# Patient Record
Sex: Male | Born: 1960 | Race: White | Hispanic: No | Marital: Single | Smoking: Former smoker
Health system: Southern US, Community
[De-identification: ages and names within clinical notes are randomized; demographics above are authoritative.]

## PROBLEM LIST (undated history)

## (undated) DIAGNOSIS — N186 End stage renal disease: Secondary | ICD-10-CM

## (undated) DIAGNOSIS — Q613 Polycystic kidney, unspecified: Secondary | ICD-10-CM

## (undated) DIAGNOSIS — R0602 Shortness of breath: Secondary | ICD-10-CM

## (undated) DIAGNOSIS — I82409 Acute embolism and thrombosis of unspecified deep veins of unspecified lower extremity: Secondary | ICD-10-CM

## (undated) DIAGNOSIS — D72829 Elevated white blood cell count, unspecified: Secondary | ICD-10-CM

## (undated) DIAGNOSIS — N189 Chronic kidney disease, unspecified: Secondary | ICD-10-CM

## (undated) DIAGNOSIS — I10 Essential (primary) hypertension: Secondary | ICD-10-CM

## (undated) DIAGNOSIS — F419 Anxiety disorder, unspecified: Secondary | ICD-10-CM

## (undated) DIAGNOSIS — D649 Anemia, unspecified: Secondary | ICD-10-CM

## (undated) DIAGNOSIS — K219 Gastro-esophageal reflux disease without esophagitis: Secondary | ICD-10-CM

## (undated) HISTORY — PX: COLONOSCOPY: SHX174

## (undated) HISTORY — DX: Acute embolism and thrombosis of unspecified deep veins of unspecified lower extremity: I82.409

## (undated) HISTORY — PX: NOSE SURGERY: SHX723

## (undated) HISTORY — DX: Chronic kidney disease, unspecified: N18.9

---

## 2014-04-02 ENCOUNTER — Emergency Department (HOSPITAL_COMMUNITY): Payer: Self-pay

## 2014-04-02 ENCOUNTER — Ambulatory Visit (INDEPENDENT_AMBULATORY_CARE_PROVIDER_SITE_OTHER): Payer: Self-pay | Admitting: Internal Medicine

## 2014-04-02 ENCOUNTER — Inpatient Hospital Stay (HOSPITAL_COMMUNITY): Payer: Self-pay

## 2014-04-02 ENCOUNTER — Encounter (HOSPITAL_COMMUNITY): Payer: Self-pay | Admitting: Emergency Medicine

## 2014-04-02 ENCOUNTER — Inpatient Hospital Stay (HOSPITAL_COMMUNITY)
Admission: EM | Admit: 2014-04-02 | Discharge: 2014-04-10 | DRG: 673 | Disposition: A | Discharge status: Home / Self Care | Payer: Self-pay | Attending: Nephrology | Admitting: Nephrology

## 2014-04-02 ENCOUNTER — Inpatient Hospital Stay (HOSPITAL_COMMUNITY): Payer: MEDICAID

## 2014-04-02 VITALS — BP 152/104 | HR 100 | Temp 97.7°F | Resp 22 | Ht 68.0 in | Wt 168.0 lb

## 2014-04-02 DIAGNOSIS — R7309 Other abnormal glucose: Secondary | ICD-10-CM

## 2014-04-02 DIAGNOSIS — I10 Essential (primary) hypertension: Secondary | ICD-10-CM

## 2014-04-02 DIAGNOSIS — D696 Thrombocytopenia, unspecified: Secondary | ICD-10-CM | POA: Diagnosis present

## 2014-04-02 DIAGNOSIS — Q613 Polycystic kidney, unspecified: Secondary | ICD-10-CM | POA: Insufficient documentation

## 2014-04-02 DIAGNOSIS — E876 Hypokalemia: Secondary | ICD-10-CM | POA: Diagnosis present

## 2014-04-02 DIAGNOSIS — I120 Hypertensive chronic kidney disease with stage 5 chronic kidney disease or end stage renal disease: Secondary | ICD-10-CM | POA: Diagnosis present

## 2014-04-02 DIAGNOSIS — Z8271 Family history of polycystic kidney: Secondary | ICD-10-CM

## 2014-04-02 DIAGNOSIS — R791 Abnormal coagulation profile: Secondary | ICD-10-CM | POA: Diagnosis present

## 2014-04-02 DIAGNOSIS — R739 Hyperglycemia, unspecified: Secondary | ICD-10-CM

## 2014-04-02 DIAGNOSIS — N19 Unspecified kidney failure: Secondary | ICD-10-CM

## 2014-04-02 DIAGNOSIS — J9600 Acute respiratory failure, unspecified whether with hypoxia or hypercapnia: Secondary | ICD-10-CM | POA: Diagnosis present

## 2014-04-02 DIAGNOSIS — E872 Acidosis, unspecified: Secondary | ICD-10-CM

## 2014-04-02 DIAGNOSIS — R42 Dizziness and giddiness: Secondary | ICD-10-CM

## 2014-04-02 DIAGNOSIS — D72829 Elevated white blood cell count, unspecified: Secondary | ICD-10-CM

## 2014-04-02 DIAGNOSIS — E860 Dehydration: Secondary | ICD-10-CM

## 2014-04-02 DIAGNOSIS — IMO0002 Reserved for concepts with insufficient information to code with codable children: Secondary | ICD-10-CM

## 2014-04-02 DIAGNOSIS — G9341 Metabolic encephalopathy: Secondary | ICD-10-CM | POA: Diagnosis present

## 2014-04-02 DIAGNOSIS — R630 Anorexia: Secondary | ICD-10-CM | POA: Diagnosis present

## 2014-04-02 DIAGNOSIS — G9340 Encephalopathy, unspecified: Secondary | ICD-10-CM

## 2014-04-02 DIAGNOSIS — N179 Acute kidney failure, unspecified: Principal | ICD-10-CM

## 2014-04-02 DIAGNOSIS — N2581 Secondary hyperparathyroidism of renal origin: Secondary | ICD-10-CM | POA: Diagnosis present

## 2014-04-02 DIAGNOSIS — Z87891 Personal history of nicotine dependence: Secondary | ICD-10-CM

## 2014-04-02 DIAGNOSIS — Q612 Polycystic kidney, adult type: Secondary | ICD-10-CM

## 2014-04-02 DIAGNOSIS — N186 End stage renal disease: Secondary | ICD-10-CM | POA: Diagnosis present

## 2014-04-02 DIAGNOSIS — D631 Anemia in chronic kidney disease: Secondary | ICD-10-CM | POA: Diagnosis present

## 2014-04-02 DIAGNOSIS — A419 Sepsis, unspecified organism: Secondary | ICD-10-CM

## 2014-04-02 DIAGNOSIS — N039 Chronic nephritic syndrome with unspecified morphologic changes: Secondary | ICD-10-CM

## 2014-04-02 DIAGNOSIS — R0602 Shortness of breath: Secondary | ICD-10-CM

## 2014-04-02 DIAGNOSIS — R634 Abnormal weight loss: Secondary | ICD-10-CM | POA: Diagnosis present

## 2014-04-02 DIAGNOSIS — R000 Tachycardia, unspecified: Secondary | ICD-10-CM | POA: Diagnosis present

## 2014-04-02 HISTORY — DX: Essential (primary) hypertension: I10

## 2014-04-02 HISTORY — DX: Shortness of breath: R06.02

## 2014-04-02 HISTORY — DX: Polycystic kidney, unspecified: Q61.3

## 2014-04-02 HISTORY — PX: CENTRAL VENOUS CATHETER INSERTION: SHX401

## 2014-04-02 HISTORY — DX: Elevated white blood cell count, unspecified: D72.829

## 2014-04-02 HISTORY — DX: Anemia, unspecified: D64.9

## 2014-04-02 LAB — POCT CBC
Granulocyte percent: 85.2 %G — AB (ref 37–80)
HCT, POC: 29.4 % — AB (ref 43.5–53.7)
HEMOGLOBIN: 9.8 g/dL — AB (ref 14.1–18.1)
LYMPH, POC: 1.7 (ref 0.6–3.4)
MCH: 29.2 pg (ref 27–31.2)
MCHC: 33.3 g/dL (ref 31.8–35.4)
MCV: 87.5 fL (ref 80–97)
MID (CBC): 0.8 (ref 0–0.9)
MPV: 9 fL (ref 0–99.8)
POC Granulocyte: 14.5 — AB (ref 2–6.9)
POC LYMPH PERCENT: 9.8 %L — AB (ref 10–50)
POC MID %: 5 %M (ref 0–12)
Platelet Count, POC: 349 10*3/uL (ref 142–424)
RBC: 3.36 M/uL — AB (ref 4.69–6.13)
RDW, POC: 13.8 %
WBC: 17 10*3/uL — AB (ref 4.6–10.2)

## 2014-04-02 LAB — CBC WITH DIFFERENTIAL/PLATELET
BASOS ABS: 0 10*3/uL (ref 0.0–0.1)
Basophils Relative: 0 % (ref 0–1)
EOS ABS: 0 10*3/uL (ref 0.0–0.7)
Eosinophils Relative: 0 % (ref 0–5)
HEMATOCRIT: 27.6 % — AB (ref 39.0–52.0)
Hemoglobin: 9.7 g/dL — ABNORMAL LOW (ref 13.0–17.0)
LYMPHS ABS: 0.5 10*3/uL — AB (ref 0.7–4.0)
Lymphocytes Relative: 3 % — ABNORMAL LOW (ref 12–46)
MCH: 29.5 pg (ref 26.0–34.0)
MCHC: 35.1 g/dL (ref 30.0–36.0)
MCV: 83.9 fL (ref 78.0–100.0)
MONO ABS: 0.6 10*3/uL (ref 0.1–1.0)
Monocytes Relative: 4 % (ref 3–12)
NEUTROS ABS: 14.9 10*3/uL — AB (ref 1.7–7.7)
Neutrophils Relative %: 93 % — ABNORMAL HIGH (ref 43–77)
PLATELETS: 341 10*3/uL (ref 150–400)
RBC: 3.29 MIL/uL — ABNORMAL LOW (ref 4.22–5.81)
RDW: 14.1 % (ref 11.5–15.5)
WBC: 16 10*3/uL — ABNORMAL HIGH (ref 4.0–10.5)

## 2014-04-02 LAB — I-STAT VENOUS BLOOD GAS, ED
ACID-BASE DEFICIT: 22 mmol/L — AB (ref 0.0–2.0)
Bicarbonate: 6.2 mEq/L — ABNORMAL LOW (ref 20.0–24.0)
O2 SAT: 33 %
PCO2 VEN: 21.7 mmHg — AB (ref 45.0–50.0)
PO2 VEN: 28 mmHg — AB (ref 30.0–45.0)
TCO2: 7 mmol/L (ref 0–100)
pH, Ven: 7.061 — CL (ref 7.250–7.300)

## 2014-04-02 LAB — BASIC METABOLIC PANEL
BUN: 202 mg/dL — AB (ref 6–23)
BUN: 197 mg/dL — ABNORMAL HIGH (ref 6–23)
BUN: 179 mg/dL — ABNORMAL HIGH (ref 6–23)
CHLORIDE: 90 meq/L — AB (ref 96–112)
CHLORIDE: 94 meq/L — AB (ref 96–112)
CHLORIDE: 95 meq/L — AB (ref 96–112)
CO2: 9 meq/L — AB (ref 19–32)
CREATININE: 25.35 mg/dL — AB (ref 0.50–1.35)
Calcium: 4.1 mg/dL — CL (ref 8.4–10.5)
Calcium: 4.3 mg/dL — CL (ref 8.4–10.5)
Creatinine, Ser: 22.56 mg/dL — ABNORMAL HIGH (ref 0.50–1.35)
Creatinine, Ser: 18.59 mg/dL — ABNORMAL HIGH (ref 0.50–1.35)
GFR calc Af Amer: 2 mL/min — ABNORMAL LOW (ref >90)
GFR calc Af Amer: 2 mL/min — ABNORMAL LOW (ref >90)
GFR calc Af Amer: 3 mL/min — ABNORMAL LOW (ref >90)
GFR calc non Af Amer: 2 mL/min — ABNORMAL LOW (ref >90)
GFR calc non Af Amer: 2 mL/min — ABNORMAL LOW (ref >90)
GFR calc non Af Amer: 2 mL/min — ABNORMAL LOW (ref >90)
GLUCOSE: 143 mg/dL — AB (ref 70–99)
Glucose, Bld: 184 mg/dL — ABNORMAL HIGH (ref 70–99)
Glucose, Bld: 152 mg/dL — ABNORMAL HIGH (ref 70–99)
POTASSIUM: 3.6 meq/L — AB (ref 3.7–5.3)
POTASSIUM: 2.9 meq/L — AB (ref 3.7–5.3)
Potassium: 4.1 mEq/L (ref 3.7–5.3)
SODIUM: 137 meq/L (ref 137–147)
SODIUM: 138 meq/L (ref 137–147)
Sodium: 135 mEq/L — ABNORMAL LOW (ref 137–147)

## 2014-04-02 LAB — PRO B NATRIURETIC PEPTIDE: Pro B Natriuretic peptide (BNP): >70000 pg/mL — ABNORMAL HIGH (ref 0.0–100.0)

## 2014-04-02 LAB — I-STAT ARTERIAL BLOOD GAS, ED
ACID-BASE DEFICIT: 21 mmol/L — AB (ref 0.0–2.0)
BICARBONATE: 5.1 meq/L — AB (ref 20.0–24.0)
O2 Saturation: 98 %
PCO2 ART: 13.6 mmHg — AB (ref 35.0–45.0)
PO2 ART: 117 mmHg — AB (ref 80.0–100.0)
TCO2: 5 mmol/L (ref 0–100)
pH, Arterial: 7.181 — CL (ref 7.350–7.450)

## 2014-04-02 LAB — POCT ACTIVATED CLOTTING TIME
Activated Clotting Time: 116 seconds
Activated Clotting Time: 143 seconds
Activated Clotting Time: 149 seconds
Activated Clotting Time: 171 seconds
Activated Clotting Time: 171 seconds

## 2014-04-02 LAB — URINE MICROSCOPIC-ADD ON

## 2014-04-02 LAB — RENAL FUNCTION PANEL
Albumin: 3.6 g/dL (ref 3.5–5.2)
BUN: 201 mg/dL — AB (ref 6–23)
CHLORIDE: 92 meq/L — AB (ref 96–112)
CO2: <7 mEq/L — CL (ref 19–32)
Calcium: 4 mg/dL — CL (ref 8.4–10.5)
Creatinine, Ser: 24.55 mg/dL — ABNORMAL HIGH (ref 0.50–1.35)
GFR calc Af Amer: 2 mL/min — ABNORMAL LOW (ref >90)
GFR, EST NON AFRICAN AMERICAN: 2 mL/min — AB (ref >90)
Glucose, Bld: 139 mg/dL — ABNORMAL HIGH (ref 70–99)
POTASSIUM: 3.9 meq/L (ref 3.7–5.3)
Phosphorus: 13.1 mg/dL — ABNORMAL HIGH (ref 2.3–4.6)
Sodium: 135 mEq/L — ABNORMAL LOW (ref 137–147)

## 2014-04-02 LAB — PROTIME-INR
INR: 1.51 — ABNORMAL HIGH (ref 0.00–1.49)
Prothrombin Time: 17.8 seconds — ABNORMAL HIGH (ref 11.6–15.2)

## 2014-04-02 LAB — I-STAT TROPONIN, ED: TROPONIN I, POC: 0.03 ng/mL (ref 0.00–0.08)

## 2014-04-02 LAB — GLUCOSE, POCT (MANUAL RESULT ENTRY): POC Glucose: 192 mg/dl — AB (ref 70–99)

## 2014-04-02 LAB — URINALYSIS, ROUTINE W REFLEX MICROSCOPIC
Bilirubin Urine: NEGATIVE
Glucose, UA: 100 mg/dL — AB
Ketones, ur: 15 mg/dL — AB
Nitrite: NEGATIVE
PH: 5.5 (ref 5.0–8.0)
PROTEIN: 100 mg/dL — AB
Specific Gravity, Urine: 1.015 (ref 1.005–1.030)
Urobilinogen, UA: 0.2 mg/dL (ref 0.0–1.0)

## 2014-04-02 LAB — HEPATITIS B SURFACE ANTIGEN: HEP B S AG: NEGATIVE

## 2014-04-02 LAB — APTT: APTT: 33 s (ref 24–37)

## 2014-04-02 LAB — I-STAT CG4 LACTIC ACID, ED: Lactic Acid, Venous: 1.1 mmol/L (ref 0.5–2.2)

## 2014-04-02 LAB — IRON AND TIBC: IRON: 178 ug/dL — AB (ref 42–135)

## 2014-04-02 LAB — MRSA PCR SCREENING: MRSA by PCR: NEGATIVE

## 2014-04-02 LAB — HIV ANTIBODY (ROUTINE TESTING W REFLEX): HIV 1&2 Ab, 4th Generation: NONREACTIVE

## 2014-04-02 LAB — GLUCOSE, CAPILLARY: Glucose-Capillary: 132 mg/dL — ABNORMAL HIGH (ref 70–99)

## 2014-04-02 LAB — PHOSPHORUS: PHOSPHORUS: 14 mg/dL — AB (ref 2.3–4.6)

## 2014-04-02 LAB — FERRITIN: Ferritin: 383 ng/mL — ABNORMAL HIGH (ref 22–322)

## 2014-04-02 MED ORDER — PRISMASOL BGK 4/2.5 32-4-2.5 MEQ/L IV SOLN
INTRAVENOUS | Status: DC
  Administered 2014-04-02: 17:00:00 via INTRAVENOUS_CENTRAL
  Filled 2014-04-02 (×5): qty 5000

## 2014-04-02 MED ORDER — ASPIRIN 300 MG RE SUPP: 300.0000 mg | RECTAL | Status: AC

## 2014-04-02 MED ORDER — HEPARIN SODIUM (PORCINE) 1000 UNIT/ML DIALYSIS
1000.0000 [IU] | INTRAMUSCULAR | Status: DC | PRN
  Filled 2014-04-02: qty 6

## 2014-04-02 MED ORDER — HEPARIN BOLUS VIA INFUSION (CRRT)
1000.0000 [IU] | INTRAVENOUS | Status: DC | PRN
Start: 2014-04-02 — End: 2014-04-03
  Administered 2014-04-02 (×2): 1000 [IU] via INTRAVENOUS_CENTRAL
  Filled 2014-04-02: qty 1000

## 2014-04-02 MED ORDER — FAMOTIDINE IN NACL 20-0.9 MG/50ML-% IV SOLN
20.0000 mg | Freq: Two times a day (BID) | INTRAVENOUS | Status: DC
  Filled 2014-04-02: qty 50

## 2014-04-02 MED ORDER — PNEUMOCOCCAL VAC POLYVALENT 25 MCG/0.5ML IJ INJ
0.5000 mL | INJECTION | INTRAMUSCULAR | Status: AC
  Administered 2014-04-03: 0.5 mL via INTRAMUSCULAR
  Filled 2014-04-02: qty 0.5

## 2014-04-02 MED ORDER — ASPIRIN 81 MG PO CHEW
324.0000 mg | CHEWABLE_TABLET | ORAL | Status: AC
  Administered 2014-04-02: 324 mg via ORAL
  Filled 2014-04-02: qty 4

## 2014-04-02 MED ORDER — SODIUM CHLORIDE 0.9 % IJ SOLN
250.0000 [IU]/h | INTRAMUSCULAR | Status: DC
  Administered 2014-04-02: 0.5 [IU]/h via INTRAVENOUS_CENTRAL
  Administered 2014-04-03: 1150 [IU]/h via INTRAVENOUS_CENTRAL
  Administered 2014-04-03: 1400 [IU]/h via INTRAVENOUS_CENTRAL
  Filled 2014-04-02 (×4): qty 2

## 2014-04-02 MED ORDER — FAMOTIDINE IN NACL 20-0.9 MG/50ML-% IV SOLN
20.0000 mg | INTRAVENOUS | Status: DC
  Administered 2014-04-02: 20 mg via INTRAVENOUS
  Filled 2014-04-02 (×2): qty 50

## 2014-04-02 MED ORDER — SODIUM BICARBONATE 8.4 % IV SOLN
INTRAVENOUS | Status: DC
  Administered 2014-04-02 – 2014-04-03 (×2): via INTRAVENOUS
  Filled 2014-04-02 (×4): qty 150

## 2014-04-02 MED ORDER — SODIUM CHLORIDE 0.9 % IV SOLN: 250.0000 mL | INTRAVENOUS | Status: DC | PRN

## 2014-04-02 MED ORDER — PRISMASOL BGK 4/2.5 32-4-2.5 MEQ/L IV SOLN
INTRAVENOUS | Status: DC
  Administered 2014-04-02 – 2014-04-03 (×4): via INTRAVENOUS_CENTRAL
  Filled 2014-04-02 (×10): qty 5000

## 2014-04-02 MED ORDER — POTASSIUM CHLORIDE 10 MEQ/50ML IV SOLN
INTRAVENOUS | Status: AC
  Filled 2014-04-02: qty 50

## 2014-04-02 MED ORDER — CALCIUM CARBONATE ANTACID 500 MG PO CHEW
800.0000 mg | CHEWABLE_TABLET | Freq: Four times a day (QID) | ORAL | Status: DC
  Administered 2014-04-02 (×2): 800 mg via ORAL
  Filled 2014-04-02 (×7): qty 4

## 2014-04-02 MED ORDER — HEPARIN SODIUM (PORCINE) 5000 UNIT/ML IJ SOLN
5000.0000 [IU] | Freq: Three times a day (TID) | INTRAMUSCULAR | Status: DC
  Administered 2014-04-02 – 2014-04-07 (×12): 5000 [IU] via SUBCUTANEOUS
  Filled 2014-04-02 (×18): qty 1

## 2014-04-02 MED ORDER — SODIUM CHLORIDE 0.9 % IV BOLUS (SEPSIS)
500.0000 mL | Freq: Once | INTRAVENOUS | Status: AC
  Administered 2014-04-02: 500 mL via INTRAVENOUS

## 2014-04-02 MED ORDER — BIOTENE DRY MOUTH MT LIQD
15.0000 mL | Freq: Two times a day (BID) | OROMUCOSAL | Status: DC
  Administered 2014-04-02 – 2014-04-03 (×2): 15 mL via OROMUCOSAL

## 2014-04-02 MED ORDER — POTASSIUM CHLORIDE 10 MEQ/50ML IV SOLN
10.0000 meq | INTRAVENOUS | Status: AC
  Administered 2014-04-02 – 2014-04-03 (×3): 10 meq via INTRAVENOUS
  Filled 2014-04-02 (×2): qty 50

## 2014-04-02 MED ORDER — ONDANSETRON HCL 4 MG/2ML IJ SOLN: 4.0000 mg | Freq: Four times a day (QID) | INTRAMUSCULAR | Status: DC | PRN

## 2014-04-02 MED ORDER — HEPARIN (PORCINE) 2000 UNITS/L FOR CRRT
INTRAVENOUS_CENTRAL | Status: DC | PRN
  Filled 2014-04-02: qty 1000

## 2014-04-02 MED ORDER — PANTOPRAZOLE SODIUM 40 MG IV SOLR: 40.0000 mg | INTRAVENOUS | Status: DC

## 2014-04-02 NOTE — H&P (Signed)
PULMONARY / CRITICAL CARE MEDICINE   Name: [name] [name] MRN: [id number] DOB: [birth year]    ADMISSION DATE:  04/02/2014  REFERRING MD :  Dr. Christy Gentles  PRIMARY SERVICE: PCCM  CHIEF COMPLAINT:  Metabolic Acidosis  BRIEF PATIENT DESCRIPTION: 53 y/o M with PMH of polycystic kidney disease (no follow up ) who presented to [facility] ER on 6/15 with a 2 wk hx of increasing shortness of breath.  Found to have profound metabolic acidosis and acute renal failure.    SIGNIFICANT EVENTS / STUDIES:  6/15 - Admit with a 2 wk hx of increasing shortness of breath.  Found to have profound metabolic acidosis and acute renal failure.    LINES / TUBES: R IJ HD Cath 6/15 >>  CULTURES: BCx2 6/15 >> UA 6/15 >> UC 6/15 >>   ANTIBIOTICS:   HISTORY OF PRESENT ILLNESS:  53 y/o M, never smoker,  with PMH of polycystic kidney disease who presented to [facility] ER on 6/15 with a 2 wk hx of increasing shortness of breath, nausea, vomiting and diarrhea (reports 1 BM per day that is loose).  Occasionally stool has had some bloody streaks.  He notes that he has lost approximately 30 lbs over the last several weeks. Patient reports he does not follow with anyone regarding his PCKD.  He sought help at the Urgent Care and was noted to be significantly short of breath and cyanotic.  Patient was transferred to [facility] ER for further evaluation.  Family reports that patients sister & father are deceased of kidney failure and his brother is on dialysis. He indicates he does not heal well and has multiple LE round lesions.    Initial ER evaluation notable for venous gas ph 7.06 / Bicarb 6.2,  Na 135, K 4.1, CO2 <7, BUN 202, Sr Cr 25.35, glucose 184, troponin 0.03, proBNP > 70,000, lactic acid 1.10, WBC 16, Hgb 9.7 & clear CXR.  PCCM called for ICU admission.    PAST MEDICAL HISTORY :  Past Medical History  Diagnosis Date  . Chronic kidney disease    History reviewed. No pertinent past surgical history. Prior to Admission medications    Medication Sig Start Date End Date Taking? Authorizing Provider  cetirizine (ZYRTEC) 10 MG tablet Take 10 mg by mouth daily as needed for allergies.   Yes Historical Provider, MD  ibuprofen (ADVIL,MOTRIN) 200 MG tablet Take 200 mg by mouth every 8 (eight) hours as needed for moderate pain.   Yes Historical Provider, MD   No Known Allergies  FAMILY HISTORY:  History reviewed. No pertinent family history.  SOCIAL HISTORY:  reports that he has never smoked. He does not have any smokeless tobacco history on file. He reports that he does not drink alcohol. His drug history is not on file.  REVIEW OF SYSTEMS:   Gen: Denies fever, chills, night sweats.  Reports weight change of ~ 30 lbs, fatigue HEENT: Denies blurred vision, double vision, hearing loss, tinnitus, sinus congestion, rhinorrhea, sore throat, neck stiffness, dysphagia PULM: Denies cough, sputum production, hemoptysis, wheezing.  Reports increasing shortness of breath CV: Denies chest pain, edema, orthopnea, paroxysmal nocturnal dyspnea, palpitations GI: Denies abdominal pain, melena, constipation, change in bowel habits.  Reports nausea, vomiting, diarrhea, occasional bright bloody streaks GU: Denies dysuria, hematuria, polyuria, oliguria, urethral discharge Endocrine: Denies hot or cold intolerance, polyuria, polyphagia or appetite change Derm: Denies Pangilinan, dry skin, scaling or peeling skin change.  Reports poor wound healing.  Heme: Denies easy bruising, bleeding, bleeding gums Neuro:  Denies headache, numbness, weakness, slurred speech, loss of memory or consciousness   SUBJECTIVE:   VITAL SIGNS: Temp:  [97.5 F (36.4 C)-97.7 F (36.5 C)] 97.5 F (36.4 C) (06/15 1246) Pulse Rate:  [94-100] 96 (06/15 1430) Resp:  [17-22] 17 (06/15 1430) BP: (130-153)/(76-108) 153/99 mmHg (06/15 1430) SpO2:  [100 %] 100 % (06/15 1430) Weight:  [168 lb (76.204 kg)] 168 lb (76.204 kg) (06/15 1000)  HEMODYNAMICS:    VENTILATOR  SETTINGS:    INTAKE / OUTPUT: Intake/Output   None     PHYSICAL EXAMINATION: General:  Thin adult male, appears uncomfortable Neuro:  Mild lethargy, able to answer questions appropriately HEENT:  Mm pink/dry, no jvd Cardiovascular:  s1s2 rrr, no m/r/g Lungs:  resp's even/non-labored, lungs bilaterally clear Abdomen:  Round/soft, mild distention (pt reports is his 'norm'), BSx4 active  Musculoskeletal:  No acute deformities  Skin:  Cool/dry, scattered small round lesions on LE's of varying stages (appearance of old bug bites)  LABS:  CBC  Recent Labs Lab 04/02/14 1141 04/02/14 1228  WBC 17.0* 16.0*  HGB 9.8* 9.7*  HCT 29.4* 27.6*  PLT  --  341   Coag's  Recent Labs Lab 04/02/14 1237  APTT 33  INR 1.51*   BMET  Recent Labs Lab 04/02/14 1228  NA 135*  K 4.1  CL 90*  CO2 <7*  BUN 202*  CREATININE 25.35*  GLUCOSE 184*   Electrolytes  Recent Labs Lab 04/02/14 1228  CALCIUM 4.1*   Sepsis Markers  Recent Labs Lab 04/02/14 1304  LATICACIDVEN 1.10   ABG No results found for this basename: PHART, PCO2ART, PO2ART,  in the last 168 hours Liver Enzymes No results found for this basename: AST, ALT, ALKPHOS, BILITOT, ALBUMIN,  in the last 168 hours Cardiac Enzymes  Recent Labs Lab 04/02/14 1228  PROBNP >70000.0*   Glucose No results found for this basename: GLUCAP,  in the last 168 hours  Imaging Dg Chest Port 1 View  04/02/2014   CLINICAL DATA:  Chronic renal insufficiency and polycystic kidney disease  EXAM: PORTABLE CHEST - 1 VIEW  COMPARISON:  None.  FINDINGS: The lungs are mildly hypoinflated which accentuates the perihilar interstitial markings.The cardiac silhouette is top-normal in size. The pulmonary vascularity is not clearly engorged. The observed bony structures are unremarkable.  IMPRESSION: Mildly increased perihilar lung markings may be due to vascular crowding or could reflect subsegmental atelectasis. Otherwise there is no acute  cardiopulmonary abnormality.   Electronically Signed   By: David  Martinique   On: 04/02/2014 13:07     ASSESSMENT / PLAN:  PULMONARY A: Respiratory Distress / Tachypnea - in setting of profound metabolic acidosis related to AKI Uncompensated met acidosis P:   -O2 to support sats > 92% -pulmonary hygiene -PRN CXR -f/u ABG in 2 hours -monitor respiratory status closely, high risk intubation -urgent HD -Bicarb drip  CARDIOVASCULAR A:  Tachycardia  Hypertension  P:  -ICU admit, monitor hemodynamics -hold adjustment of HTN until HD initiated, if remains hypertensive overnight, consider rx in am 6/16 with BB -f/u EKG in am   RENAL A:   Acute Renal Failure Polycystic Kidney Disease hypocalcemia P:   -Now placement of HD cath -Emergent CVVHD per Nephrology -Q4 BMP -now bicarb gtt -STAT phos, may need ca supp, per renal -place foley STAT -renal imaging  GASTROINTESTINAL A:   Nausea / Vomiting - likely related to uremia / AKI Diarrhea  P:   -BID pepcid -PRN zofran  -NPO -LFT  HEMATOLOGIC  A:   Anemia - likely of chronic disease.  Reported blood streaked stool pta.  P:  -follow H/H -assess FOB to r/o GI source bleeding -sub q heparin -assess coags  INFECTIOUS A:   R/O Infectious Process as Precipitating Factor for AKI P:   -Assess BC, UA, UC -Hold ABX for now  -Assess HIV with weight loss. Lower ext lesions  ENDOCRINE A:   Hyperglycemia  P:   -Monitor CBG trend on BMP -Hold SSI unless glucose consistently > 200  NEUROLOGIC A:   Acute Metabolic Encephalopathy - in setting of uremia  P:   RASS goal: N/A -Monitor / Poth, NP-C [facility] Pgr: [phone] or [phone]   I updated Mom in full.   I have personally obtained a history, examined the patient, evaluated laboratory and imaging results, formulated the assessment and plan and placed orders.  CRITICAL CARE: The patient is critically ill with  multiple organ systems failure and requires high complexity decision making for assessment and support, frequent evaluation and titration of therapies, application of advanced monitoring technologies and extensive interpretation of multiple databases. Critical Care Time devoted to patient care services described in this note is 50 minutes.    04/02/2014, 3:03 PM  Lavon Paganini. Titus Mould, MD, [place] Pgr: [facility]

## 2014-04-02 NOTE — Procedures (Signed)
Central Venous Catheter Insertion Procedure Note [name] [name] [id number] May 17, 1961  Procedure: Insertion of Central Venous Catheter Indications: HD, emregent  Procedure Details Consent: Risks of procedure as well as the alternatives and risks of each were explained to the (patient/caregiver).  Consent for procedure obtained. Time Out: Verified patient identification, verified procedure, site/side was marked, verified correct patient position, special equipment/implants available, medications/allergies/relevent history reviewed, required imaging and test results available.  Performed  Maximum sterile technique was used including antiseptics, cap, gloves, gown, hand hygiene, mask and sheet. Skin prep: Chlorhexidine; local anesthetic administered A antimicrobial bonded/coated triple lumen catheter was placed in the right internal jugular vein using the Seldinger technique.  Evaluation Blood flow good Complications: No apparent complications Patient did tolerate procedure well. Chest X-ray ordered to verify placement.  CXR: wnl  [name] [name]. 04/02/2014, 3:49 PM  [place] Tolerated well [name] [name]. Titus Mould, MD, [place] Pgr: [facility]

## 2014-04-02 NOTE — ED Notes (Signed)
[facility] spoke with me to emphasize importance of getting the bicarbonate hung as quickly as possible.  Called pharmacy to expedite sodium bicarbonate, ETA 5-10 minutes and to arrive in pod B tube station

## 2014-04-02 NOTE — ED Notes (Signed)
Pt.s abd is distended but states that is his norm

## 2014-04-02 NOTE — Progress Notes (Signed)
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 Because of the severity of the acidosis (and the limitation in duration of initial HD treatments in dialysis naive patients because of the risk of disequilibrium syndrome) feel CRRT for first 24 hours would be best in terms of providing him with some initial dialysis but allowing for faster correction of his acidosis given that a slow short HD will have little impact on his acid base status.  Orders entered, full consult note to follow. Management of calcium dep on phosphorus level - pending

## 2014-04-02 NOTE — Progress Notes (Signed)
   Subjective:    Patient ID: [name] [name], male    DOB: [birth year], 53 y.o.   MRN: [id number]  HPI 52 year old male pt presents with diarrhea, nausea, and vomiting. He has been throwing up once or twice a day. On occasion he has seen some blood in his stool. No mucous or pus. Some days he has diarrhea several times a day. He takes imodium to help with the diarrhea, but the imodium will constipate him. No abdominal pain. No chest pain. Some SOB. Fatigue. Pt feels dehydrated. Has been trying to drink a lot of fluids, but still feels dehydrated. Pt has polycystic kidney disease.   Sister and father died of kidney failure, his brother is on dialysis for kidney failure and recently hospitalized for viral myocarditis. He is acute on chronic ill, tachypneic, cyanotic, cold and dry, stumbling when walks, mouth very dry. Has lost 30-40lbs, is dizzy.  Stat oxygen started, stat glucose 102, wbc 18000 shift left.   Review of Systems     Objective:   Physical Exam  Constitutional: He appears lethargic. He appears cachectic. He is cooperative. He appears toxic. He appears distressed. Nasal cannula in place.    cyanosis  HENT:  Mouth/Throat: Mucous membranes are cyanotic.  Mouth very dry mms  Eyes: EOM are normal. Pupils are equal, round, and reactive to light. No scleral icterus.  Neck: Normal range of motion. Neck supple. No tracheal deviation present.  Cardiovascular: Regular rhythm.  Tachycardia present.  Exam reveals gallop. Exam reveals no friction rub.   No murmur heard. Pulmonary/Chest: Accessory muscle usage present. No stridor. Tachypnea noted. He is in respiratory distress. He has decreased breath sounds. He has no wheezes. He has rhonchi. He has no rales.  Abdominal: He exhibits distension and mass. There is no tenderness.  Neurological: He appears lethargic. No cranial nerve deficit. He exhibits normal muscle tone. Coordination and gait abnormal.  Psychiatric: He has a normal mood and  affect. Thought content normal. His speech is delayed and slurred. He is slowed. Cognition and memory are impaired. He expresses inappropriate judgment.   Stat EKG tachycardia, long q-t, no acute changes.  Results for orders placed in visit on 04/02/14  GLUCOSE, POCT (MANUAL RESULT ENTRY)      Result Value Ref Range   POC Glucose 192 (*) 70 - 99 mg/dl  POCT CBC      Result Value Ref Range   WBC 17.0 (*) 4.6 - 10.2 K/uL   Lymph, poc 1.7  0.6 - 3.4   POC LYMPH PERCENT 9.8 (*) 10 - 50 %L   MID (cbc) 0.8  0 - 0.9   POC MID % 5.0  0 - 12 %M   POC Granulocyte 14.5 (*) 2 - 6.9   Granulocyte percent 85.2 (*) 37 - 80 %G   RBC 3.36 (*) 4.69 - 6.13 M/uL   Hemoglobin 9.8 (*) 14.1 - 18.1 g/dL   HCT, POC 29.4 (*) 43.5 - 53.7 %   MCV 87.5  80 - 97 fL   MCH, POC 29.2  27 - 31.2 pg   MCHC 33.3  31.8 - 35.4 g/dL   RDW, POC 13.8     Platelet Count, POC 349  142 - 424 K/uL   MPV 9.0  0 - 99.8 fL        Assessment & Plan:  SOB/Tachypnea/Cyanosis/ Stumbling gait Poly cystic kidney disease/Possible sepsis Anemia/Possible colon cancer and or renal failure Hypoxia

## 2014-04-02 NOTE — ED Notes (Signed)
Rt at the bedside attempting for ABG

## 2014-04-02 NOTE — ED Notes (Signed)
Pt here from urgent care , pt walked in urgent sob and cyanotic [facility] called out , pt has history of renal problems wbc at urgent care 17.0

## 2014-04-02 NOTE — ED Notes (Signed)
MD at bedside for central line placement. Pt awake and talkative

## 2014-04-02 NOTE — ED Provider Notes (Signed)
Pt found to have significant renal failure and metabolic acidosis and will require dialysis Nephrology has been consulted to arrange dialysis Critical care has also been consulted for admission   Sharyon Cable, MD 04/02/14 1351

## 2014-04-02 NOTE — Progress Notes (Signed)
CRITICAL VALUE ALERT  Critical value received:  Calcium 4.0,Co2 <7  Date of notification:  04/02/2014  Time of notification:  [id number]  Critical value read back:yes  Nurse who received alert:  Marisue Brooklyn  MD notified (1st page):  Dr Lyndal Pulley  Time of first page:  15  MD notified (2nd page):  Time of second page:  Responding MD:  MD at bedside. Both PCCM and Renal  Time MD responded:  1550

## 2014-04-02 NOTE — ED Provider Notes (Signed)
CSN: [id number]     Arrival date & time 04/02/14  1211 History   First MD Initiated Contact with Patient 04/02/14 1218     Chief Complaint  Patient presents with  . Shortness of Breath     (Consider location/radiation/quality/duration/timing/severity/associated sxs/prior Treatment) Patient is a 53 y.o. male presenting with shortness of breath. The history is provided by the patient.  Shortness of Breath Severity:  Moderate Onset quality:  Gradual Timing:  Constant Progression:  Worsening Chronicity:  New Relieved by:  Rest Worsened by:  Exertion Associated symptoms: no abdominal pain, no chest pain, no cough, no fever, no headaches and no vomiting     53 yo male pw sob. Gradual onset over past few days. Worsening. No cough or fever. Not had this previously. Has had diarrhea for past 3 weeks. Started taking immodium with some improvement. Has about 1 BM daily that is loose. Occasionally some bright red blood streaks. No other blood or dark stools.  H/o PCKD. Denies any other pmh. Not on any medications.  No cp. No pain anywhere per patient.  Denies neck swelling. No stridor.  No lower extremity edema or pain. No recent immobilization. No prior dvt/pe.  No cardiac history.   Per nursing report, patient was at urgent care and appeared cyanotic so placed on oxygen. No O2 sat taken prior to oxygen.     Past Medical History  Diagnosis Date  . Chronic kidney disease    History reviewed. No pertinent past surgical history. History reviewed. No pertinent family history. History  Substance Use Topics  . Smoking status: Never Smoker   . Smokeless tobacco: Not on file  . Alcohol Use: No    Review of Systems  Constitutional: Negative for fever and chills.  HENT: Negative for congestion and rhinorrhea.   Respiratory: Positive for shortness of breath. Negative for cough.   Cardiovascular: Negative for chest pain and leg swelling.  Gastrointestinal: Positive for diarrhea. Negative  for nausea, vomiting and abdominal pain.  Genitourinary: Negative for flank pain and difficulty urinating.  Musculoskeletal: Negative for back pain.  Neurological: Negative for dizziness and headaches.  All other systems reviewed and are negative.     Allergies  Review of patient's allergies indicates no known allergies.  Home Medications   Prior to Admission medications   Medication Sig Start Date End Date Taking? Authorizing Provider  cetirizine (ZYRTEC) 10 MG tablet Take 10 mg by mouth daily as needed for allergies.   Yes Historical Provider, MD  ibuprofen (ADVIL,MOTRIN) 200 MG tablet Take 200 mg by mouth every 8 (eight) hours as needed for moderate pain.   Yes Historical Provider, MD   BP 153/99  Pulse 96  Temp(Src) 97.5 F (36.4 C) (Axillary)  Resp 17  SpO2 100% Physical Exam  Nursing note and vitals reviewed. Constitutional: He is oriented to person, place, and time. He appears well-developed and well-nourished. No distress.  HENT:  Head: Normocephalic and atraumatic.  Eyes: Conjunctivae are normal. Right eye exhibits no discharge. Left eye exhibits no discharge.  Neck: No tracheal deviation present.  Cardiovascular: Normal rate, regular rhythm, normal heart sounds and intact distal pulses.   Pulmonary/Chest: Breath sounds normal. No stridor. He has no wheezes. He has no rales.  Mild tachypnea low 20's. Lungs clear throughout.  Abdominal: Soft. He exhibits no distension. There is no tenderness. There is no guarding.  Musculoskeletal: He exhibits no edema and no tenderness.  Neurological: He is alert and oriented to person, place, and time.  Skin: Skin is warm and dry.  Psychiatric: He has a normal mood and affect. His behavior is normal.    ED Course  Procedures (including critical care time) Labs Review Labs Reviewed  BASIC METABOLIC PANEL - Abnormal; Notable for the following:    Sodium 135 (*)    Chloride 90 (*)    CO2 <7 (*)    Glucose, Bld 184 (*)    BUN  202 (*)    Creatinine, Ser 25.35 (*)    Calcium 4.1 (*)    GFR calc non Af Amer 2 (*)    GFR calc Af Amer 2 (*)    All other components within normal limits  CBC WITH DIFFERENTIAL - Abnormal; Notable for the following:    WBC 16.0 (*)    RBC 3.29 (*)    Hemoglobin 9.7 (*)    HCT 27.6 (*)    Neutrophils Relative % 93 (*)    Lymphocytes Relative 3 (*)    Neutro Abs 14.9 (*)    Lymphs Abs 0.5 (*)    All other components within normal limits  PRO B NATRIURETIC PEPTIDE - Abnormal; Notable for the following:    Pro B Natriuretic peptide (BNP) >70000.0 (*)    All other components within normal limits  PROTIME-INR - Abnormal; Notable for the following:    Prothrombin Time 17.8 (*)    INR 1.51 (*)    All other components within normal limits  I-STAT VENOUS BLOOD GAS, ED - Abnormal; Notable for the following:    pH, Ven 7.061 (*)    pCO2, Ven 21.7 (*)    pO2, Ven 28.0 (*)    Bicarbonate 6.2 (*)    Acid-base deficit 22.0 (*)    All other components within normal limits  CULTURE, BLOOD (ROUTINE X 2)  CULTURE, BLOOD (ROUTINE X 2)  URINE CULTURE  APTT  BLOOD GAS, VENOUS  PHOSPHORUS  APTT  PROTIME-INR  RENAL FUNCTION PANEL  PARATHYROID HORMONE, INTACT (NO CA)  HEPATITIS B SURFACE ANTIGEN  IRON AND TIBC  FERRITIN  URINALYSIS, ROUTINE W REFLEX MICROSCOPIC  BLOOD GAS, ARTERIAL  BASIC METABOLIC PANEL  BASIC METABOLIC PANEL  BASIC METABOLIC PANEL  I-STAT TROPOININ, ED  I-STAT CG4 LACTIC ACID, ED    Imaging Review Dg Chest Port 1 View  04/02/2014   CLINICAL DATA:  Chronic renal insufficiency and polycystic kidney disease  EXAM: PORTABLE CHEST - 1 VIEW  COMPARISON:  None.  FINDINGS: The lungs are mildly hypoinflated which accentuates the perihilar interstitial markings.The cardiac silhouette is top-normal in size. The pulmonary vascularity is not clearly engorged. The observed bony structures are unremarkable.  IMPRESSION: Mildly increased perihilar lung markings may be due to  vascular crowding or could reflect subsegmental atelectasis. Otherwise there is no acute cardiopulmonary abnormality.   Electronically Signed   By: David  Martinique   On: 04/02/2014 13:07     EKG Interpretation   Date/Time:  Monday April 02 2014 12:15:11 EDT Ventricular Rate:  96 PR Interval:  156 QRS Duration: 117 QT Interval:  444 QTC Calculation: 561 R Axis:   46 Text Interpretation:  Sinus rhythm Nonspecific intraventricular conduction  delay Confirmed by Christy Gentles  MD, DONALD ([id number]) on 04/02/2014 12:19:29 PM      MDM   Final diagnoses:  Renal failure  Metabolic acidosis    53 yo male pw SOB. Not had previously.  O2 sats 100% on room air. Lungs clear throughout.  Patient with metabolic acidosis. Normal lactate. Without evidence of sepsis.  Hold on abx at this time.  BMP with creatinine ~25 and significantly elevated BUN.  Potassium wnl.  Nephrology consulted. Plan for dialysis.  Intensivist consulted for admission.   CXR without volume overload. No focal opacity to suggest pna. No ptx.  Bedside echo without pericardial effusion or gross RV strain.   Given presentation and above findings do not suspect ACS or PE at this time.  Symptoms likely 2/2 ARF.  Patient admitted.  Labs and imaging reviewed by myself and considered in medical decision making if ordered. Imaging interpreted by radiology.   Discussed case with Dr. Christy Gentles who is in agreement with assessment and plan.   Bonnita Hollow, MD 04/02/14 [phone]

## 2014-04-02 NOTE — Consult Note (Signed)
Physician:  Dr. Titus Mould Reason for Consult:  Renal failure, metabolic acidosis HPI: The patient is a 53 y.o. year-old WM with a known h/o ADPKD (and 2 family members who have had ESRD, 1 sister currently on dialysis in [place]).  He has deliberately not sought medical attention over the years but knew about his ADPKD. He presents with anorexia, fatigue, swelling and shortness of breath. Laboratory evaluation significant for profound metabolic acidosis, BUN > A999333, creatinine of 25, calcium of 4, phos of 13.1  Temp cath has been placed and because of the severity of his acidosis we are starting CRRT but will transition to HD and make plans for chronic HD once acidosis corrected.   Creatinine, Ser  Date/Time Value Ref Range Status  04/02/2014  3:57 PM 24.55* 0.50 - 1.35 mg/dL Final  04/02/2014 12:28 PM 25.35* 0.50 - 1.35 mg/dL Final    Past Me Past Medical History  Diagnosis Date  . Chronic kidney disease     Past Surgical History: History reviewed. No pertinent past surgical history.  Family History:  Family History  Problem Relation Age of Onset  . Polycystic kidney disease Father   . Polycystic kidney disease Sister    Social History:  reports that he has never smoked. He does not have any smokeless tobacco history on file. He reports that he does not drink alcohol. His drug history is not on file.  Allergies: No Known Allergies  Home medications: Prior to Admission medications   Medication Sig Start Date End Date Taking? Authorizing Provider  cetirizine (ZYRTEC) 10 MG tablet Take 10 mg by mouth daily as needed for allergies.   Yes Historical Provider, MD  ibuprofen (ADVIL,MOTRIN) 200 MG tablet Take 200 mg by mouth every 8 (eight) hours as needed for moderate pain.   Yes Historical Provider, MD    Inpatient medications: . aspirin  324 mg Oral NOW   Or  . aspirin  300 mg Rectal NOW  . famotidine (PEPCID) IV  20 mg Intravenous Q24H  . heparin  5,000 Units Subcutaneous 3 times  per day    Review of Systems Gen:  Denies headache, fever, chills, sweats.  + weight loss 20-30 lb. HEENT:  No visual change, sore throat, difficulty swallowing. Resp:  + DOE and at rest Cardiac:  No chest pain, orthopnea, PND.  + edema LE's GI:   Denies abdominal pain.   + nausea and vomiting as well as diarrhea GU:  Denies difficulty or change in voiding.  No change in urine color.     MS:  Denies joint pain or swelling.   Derm:  + pruritis and skin [place]  Neuro:   Denies focal weakness, memory problems, hx stroke or TIA.   Psych:  Denies symptoms of depression of anxiety.  No hallucination.     Physical Exam:  Blood pressure 160/102, pulse 96, temperature 97.8 F (36.6 C), temperature source Oral, resp. rate 18, height 5\' 9"  (1.753 m), weight 75 kg (165 lb 5.5 oz), SpO2 100.00%.  Gen: thin hyperpneic WM Appears dyspneic Temp cath in right IJ Skin with maculopapular lesions arms, legs, trunk Neck: no JVD, no bruits or LAN Chest: Clear Heart: regular, no rub or gallop  S1S2 No s3 Abdomen: soft, liver 2 cm below RCM Non tender Ext: Trace edema; some mottling around knees Neuro: alert, Ox3, no focal deficit; + asterixus; oriented but speaks deliberately Heme/Lymph: no bruising or LAN  Recent Labs Lab 04/02/14 1228 04/02/14 1557  NA 135* 135*  K 4.1 3.9  CL 90* 92*  CO2 <7* <7*  GLUCOSE 184* 139*  BUN 202* 201*  CREATININE 25.35* 24.55*  CALCIUM 4.1* 4.0*  PHOS 14.0* 13.1*    Recent Labs Lab 04/02/14 1557  ALBUMIN 3.6   No results found for this basename: LIPASE, AMYLASE,  in the last 168 hours No results found for this basename: AMMONIA,  in the last 168 hours CBC:  Recent Labs Lab 04/02/14 1141 04/02/14 1228  WBC 17.0* 16.0*  NEUTROABS  --  14.9*  HGB 9.8* 9.7*  HCT 29.4* 27.6*  MCV 87.5 83.9  PLT  --  341   Xrays/Other Studies: Dg Chest Portable 1 View  04/02/2014   CLINICAL DATA:  Central catheter placed  EXAM: PORTABLE CHEST - 1 VIEW  COMPARISON:   Study obtained earlier in the day  FINDINGS: Central catheter tip is in the superior vena cava. No pneumothorax. There is no edema or consolidation. Heart size and pulmonary vascularity are normal. No adenopathy. No bone lesions.  IMPRESSION: Central catheter tip in superior vena cava. No pneumothorax. Lungs clear.   Electronically Signed   By: Lowella Grip M.D.   On: 04/02/2014 15:58   Dg Chest Port 1 View  04/02/2014   CLINICAL DATA:  Chronic renal insufficiency and polycystic kidney disease  EXAM: PORTABLE CHEST - 1 VIEW  COMPARISON:  None.  FINDINGS: The lungs are mildly hypoinflated which accentuates the perihilar interstitial markings.The cardiac silhouette is top-normal in size. The pulmonary vascularity is not clearly engorged. The observed bony structures are unremarkable.  IMPRESSION: Mildly increased perihilar lung markings may be due to vascular crowding or could reflect subsegmental atelectasis. Otherwise there is no acute cardiopulmonary abnormality.   Electronically Signed   By: David  Martinique   On: 04/02/2014 13:07   Impression/Plan 1. 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 NO doubt ESRD. Multiple family members. Sister on HD.  Needs to start dialysis.  Due to severity of acidosis will use CRRT (since dialysis naive could only do a short slow HD which would not make a dent in the acidosis).  Would do full abdominal US to look for polycystic liver etc (sister has); save left arm; vein map; TDC + AVF later this week; CLIP 2. Metabolic acidosis - bicarb drip/CRRT 3. Hypocalcemia with hyperphosphatemia - start calcium based phos binders ASAP; check PTH 4. Anemia - check iron; start aranesp 5. Skin Reetz - unclear etiology; can't say this is all related to uremia 6. Hypertension - meds 7. leukocytosis - cultures to be done but no ATB's yet  Jamal Maes,  MD [facility] [phone] pager 04/02/2014, 5:40 PM

## 2014-04-02 NOTE — Consult Note (Signed)
Requesting Physician:  Dr. Titus Mould Reason for Consult:  Renal failure, metabolic acidosis HPI: The patient is a 53 y.o. year-old WM with a known h/o ADPKD (and 2 family members who have had ESRD, 1 sister currently on dialysis in [place]).  He has deliberately not sought medical attention over the years but knew about his ADPKD. He presents with anorexia, fatigue, swelling and shortness of breath. Laboratory evaluation significant for profound metabolic acidosis, BUN > A999333, creatinine of 25, calcium of 4, phos of 13.1.  Temp cath has been placed and because of the severity of his acidosis we are starting CRRT but will transition to HD and make plans for HD once acidosis corrected.   Creatinine, Ser  Date/Time Value Ref Range Status  04/02/2014  3:57 PM 24.55* 0.50 - 1.35 mg/dL Final  04/02/2014 12:28 PM 25.35* 0.50 - 1.35 mg/dL Final    Past Medical History:  Past Medical History  Diagnosis Date  . Chronic kidney disease     Past Surgical History: History reviewed. No pertinent past surgical history.  Family History:  Family History  Problem Relation Age of Onset  . Polycystic kidney disease Father   . Polycystic kidney disease Sister    Social History:  reports that he has never smoked. He does not have any smokeless tobacco history on file. He reports that he does not drink alcohol. His drug history is not on file.  Allergies: No Known Allergies  Home medications: Prior to Admission medications   Medication Sig Start Date End Date Taking? Authorizing Provider  cetirizine (ZYRTEC) 10 MG tablet Take 10 mg by mouth daily as needed for allergies.   Yes Historical Provider, MD  ibuprofen (ADVIL,MOTRIN) 200 MG tablet Take 200 mg by mouth every 8 (eight) hours as needed for moderate pain.   Yes Historical Provider, MD    Inpatient medications: . aspirin  324 mg Oral NOW   Or  . aspirin  300 mg Rectal NOW  . famotidine (PEPCID) IV  20 mg Intravenous Q24H  . heparin  5,000 Units  Subcutaneous 3 times per day    Review of Systems Gen:  Denies headache, fever, chills, sweats.  + weight loss 20-30 lb. HEENT:  No visual change, sore throat, difficulty swallowing. Resp:  + DOE and at rest Cardiac:  No chest pain, orthopnea, PND.  + edema LE's GI:   Denies abdominal pain.   + nausea and vomiting as well as diarrhea GU:  Denies difficulty or change in voiding.  No change in urine color.     MS:  Denies joint pain or swelling.   Derm:  + pruritis and skin Mirza  Neuro:   Denies focal weakness, memory problems, hx stroke or TIA.   Psych:  Denies symptoms of depression of anxiety.  No hallucination.     Physical Exam:  Blood pressure 160/102, pulse 96, temperature 97.8 F (36.6 C), temperature source Oral, resp. rate 18, height 5\' 9"  (1.753 m), weight 75 kg (165 lb 5.5 oz), SpO2 100.00%.  Gen: thin hyperpneic WM Appears dyspneic Temp cath in right IJ Skin with maculopapular lesions arms, legs, trunk Neck: no JVD, no bruits or LAN Chest: Clear Heart: regular, no rub or gallop  S1S2 No s3 Abdomen: soft, liver 2 cm below RCM Non tender Ext: Trace edema; some mottling around knees Neuro: alert, Ox3, no focal deficit; + asterixus; oriented but speaks deliberately Heme/Lymph: no bruising or LAN  Recent Labs Lab 04/02/14 1228 04/02/14 1557  NA 135*  135*  K 4.1 3.9  CL 90* 92*  CO2 <7* <7*  GLUCOSE 184* 139*  BUN 202* 201*  CREATININE 25.35* 24.55*  CALCIUM 4.1* 4.0*  PHOS 14.0* 13.1*    Recent Labs Lab 04/02/14 1557  ALBUMIN 3.6   No results found for this basename: LIPASE, AMYLASE,  in the last 168 hours No results found for this basename: AMMONIA,  in the last 168 hours CBC:  Recent Labs Lab 04/02/14 1141 04/02/14 1228  WBC 17.0* 16.0*  NEUTROABS  --  14.9*  HGB 9.8* 9.7*  HCT 29.4* 27.6*  MCV 87.5 83.9  PLT  --  341   Xrays/Other Studies: Dg Chest Portable 1 View  04/02/2014   CLINICAL DATA:  Central catheter placed  EXAM: PORTABLE CHEST -  1 VIEW  COMPARISON:  Study obtained earlier in the day  FINDINGS: Central catheter tip is in the superior vena cava. No pneumothorax. There is no edema or consolidation. Heart size and pulmonary vascularity are normal. No adenopathy. No bone lesions.  IMPRESSION: Central catheter tip in superior vena cava. No pneumothorax. Lungs clear.   Electronically Signed   By: Lowella Grip M.D.   On: 04/02/2014 15:58   Dg Chest Port 1 View  04/02/2014   CLINICAL DATA:  Chronic renal insufficiency and polycystic kidney disease  EXAM: PORTABLE CHEST - 1 VIEW  COMPARISON:  None.  FINDINGS: The lungs are mildly hypoinflated which accentuates the perihilar interstitial markings.The cardiac silhouette is top-normal in size. The pulmonary vascularity is not clearly engorged. The observed bony structures are unremarkable.  IMPRESSION: Mildly increased perihilar lung markings may be due to vascular crowding or could reflect subsegmental atelectasis. Otherwise there is no acute cardiopulmonary abnormality.   Electronically Signed   By: David  Martinique   On: 04/02/2014 13:07   Impression/Plan 1. 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 NO doubt ESRD. Multiple family members. Sister on HD.  Needs to start dialysis.  Due to severity of acidosis will use CRRT (since dialysis naive could only do a short slow HD which would not make a dent in the acidosis).  Would do renal US to look for polycystic liver etc (sister has); save left arm; vein map; TDC + AVF later this week; CLIP 2. Metabolic acidosis - bicarb drip/CRRT 3. Hypocalcemia with hyperphosphatemia - start calcium based phos binders ASAP; check PTH 4. Anemia - check iron; start aranesp 5. Skin [place] - unclear etiology; can't say this is all related to uremia 6. Hypertension - meds 7. leukocytosis - cultures to be done but no ATB's yet  Jamal Maes,  MD [facility] [phone] pager 04/02/2014, 5:40 PM

## 2014-04-02 NOTE — ED Provider Notes (Signed)
Patient seen/examined in the Emergency Department in conjunction with Resident Physician Provider [facility] Patient reports SOB with recent diarrhea Exam : awake/alert, tachypnea noted.  Lung sounds clear Plan: imaging/labs pending Pt will likely need admission    Sharyon Cable, MD 04/02/14 1245

## 2014-04-03 ENCOUNTER — Encounter (HOSPITAL_COMMUNITY): Payer: Self-pay | Admitting: General Practice

## 2014-04-03 DIAGNOSIS — N185 Chronic kidney disease, stage 5: Secondary | ICD-10-CM

## 2014-04-03 LAB — CBC
HCT: 21.6 % — ABNORMAL LOW (ref 39.0–52.0)
Hemoglobin: 7.7 g/dL — ABNORMAL LOW (ref 13.0–17.0)
MCH: 28.6 pg (ref 26.0–34.0)
MCHC: 35.6 g/dL (ref 30.0–36.0)
MCV: 80.3 fL (ref 78.0–100.0)
PLATELETS: 242 10*3/uL (ref 150–400)
RBC: 2.69 MIL/uL — AB (ref 4.22–5.81)
RDW: 13.6 % (ref 11.5–15.5)
WBC: 9.3 10*3/uL (ref 4.0–10.5)

## 2014-04-03 LAB — PARATHYROID HORMONE, INTACT (NO CA): PTH: 1120.6 pg/mL — AB (ref 14.0–72.0)

## 2014-04-03 LAB — RENAL FUNCTION PANEL
ALBUMIN: 3.3 g/dL — AB (ref 3.5–5.2)
Albumin: 3.1 g/dL — ABNORMAL LOW (ref 3.5–5.2)
BUN: 133 mg/dL — ABNORMAL HIGH (ref 6–23)
BUN: 108 mg/dL — ABNORMAL HIGH (ref 6–23)
CHLORIDE: 97 meq/L (ref 96–112)
CO2: 17 meq/L — AB (ref 19–32)
CO2: 19 mEq/L (ref 19–32)
CREATININE: 13.88 mg/dL — AB (ref 0.50–1.35)
Calcium: 5 mg/dL — CL (ref 8.4–10.5)
Calcium: 5.5 mg/dL — CL (ref 8.4–10.5)
Chloride: 97 mEq/L (ref 96–112)
Creatinine, Ser: 11.37 mg/dL — ABNORMAL HIGH (ref 0.50–1.35)
GFR calc Af Amer: 4 mL/min — ABNORMAL LOW (ref >90)
GFR calc Af Amer: 5 mL/min — ABNORMAL LOW (ref >90)
GFR calc non Af Amer: 4 mL/min — ABNORMAL LOW (ref >90)
GFR calc non Af Amer: 5 mL/min — ABNORMAL LOW (ref >90)
GLUCOSE: 194 mg/dL — AB (ref 70–99)
Glucose, Bld: 146 mg/dL — ABNORMAL HIGH (ref 70–99)
POTASSIUM: 2.8 meq/L — AB (ref 3.7–5.3)
POTASSIUM: 2.9 meq/L — AB (ref 3.7–5.3)
Phosphorus: 4 mg/dL (ref 2.3–4.6)
Phosphorus: 2.8 mg/dL (ref 2.3–4.6)
Sodium: 140 mEq/L (ref 137–147)
Sodium: 139 mEq/L (ref 137–147)

## 2014-04-03 LAB — BASIC METABOLIC PANEL
BUN: 150 mg/dL — AB (ref 6–23)
CHLORIDE: 97 meq/L (ref 96–112)
CO2: 13 mEq/L — ABNORMAL LOW (ref 19–32)
CREATININE: 15.74 mg/dL — AB (ref 0.50–1.35)
Calcium: 4.9 mg/dL — CL (ref 8.4–10.5)
GFR calc Af Amer: 4 mL/min — ABNORMAL LOW (ref >90)
GFR calc non Af Amer: 3 mL/min — ABNORMAL LOW (ref >90)
GLUCOSE: 117 mg/dL — AB (ref 70–99)
POTASSIUM: 2.8 meq/L — AB (ref 3.7–5.3)
Sodium: 140 mEq/L (ref 137–147)

## 2014-04-03 LAB — POCT ACTIVATED CLOTTING TIME
ACTIVATED CLOTTING TIME: 171 s
ACTIVATED CLOTTING TIME: 171 s
ACTIVATED CLOTTING TIME: 177 s
ACTIVATED CLOTTING TIME: 182 s
ACTIVATED CLOTTING TIME: 188 s
ACTIVATED CLOTTING TIME: 188 s
ACTIVATED CLOTTING TIME: 193 s
Activated Clotting Time: 177 seconds
Activated Clotting Time: 177 seconds
Activated Clotting Time: 182 seconds
Activated Clotting Time: 193 seconds
Activated Clotting Time: 188 seconds

## 2014-04-03 LAB — MAGNESIUM: Magnesium: 1.5 mg/dL (ref 1.5–2.5)

## 2014-04-03 LAB — APTT: APTT: 197 s — AB (ref 24–37)

## 2014-04-03 MED ORDER — METOPROLOL TARTRATE 25 MG PO TABS
25.0000 mg | ORAL_TABLET | Freq: Two times a day (BID) | ORAL | Status: DC
  Administered 2014-04-03 – 2014-04-07 (×8): 25 mg via ORAL
  Filled 2014-04-03 (×12): qty 1

## 2014-04-03 MED ORDER — POTASSIUM CHLORIDE 10 MEQ/50ML IV SOLN
10.0000 meq | INTRAVENOUS | Status: AC
  Administered 2014-04-03 (×4): 10 meq via INTRAVENOUS
  Filled 2014-04-03 (×4): qty 50

## 2014-04-03 MED ORDER — DARBEPOETIN ALFA-POLYSORBATE 200 MCG/0.4ML IJ SOLN
200.0000 ug | INTRAMUSCULAR | Status: DC
  Administered 2014-04-04: 200 ug via INTRAVENOUS
  Filled 2014-04-03: qty 0.4

## 2014-04-03 MED ORDER — HEPARIN SODIUM (PORCINE) 1000 UNIT/ML DIALYSIS
1000.0000 [IU] | INTRAMUSCULAR | Status: DC | PRN
  Administered 2014-04-03: 3000 [IU] via INTRAVENOUS_CENTRAL
  Filled 2014-04-03: qty 6

## 2014-04-03 MED ORDER — ENSURE COMPLETE PO LIQD
237.0000 mL | Freq: Two times a day (BID) | ORAL | Status: DC
  Administered 2014-04-03 – 2014-04-10 (×9): 237 mL via ORAL

## 2014-04-03 MED ORDER — CALCIUM ACETATE 667 MG PO CAPS
1334.0000 mg | ORAL_CAPSULE | Freq: Three times a day (TID) | ORAL | Status: DC
  Administered 2014-04-03 – 2014-04-10 (×18): 1334 mg via ORAL
  Filled 2014-04-03 (×25): qty 2

## 2014-04-03 MED ORDER — MAGNESIUM SULFATE 40 MG/ML IJ SOLN
2.0000 g | Freq: Once | INTRAMUSCULAR | Status: AC
  Administered 2014-04-03: 2 g via INTRAVENOUS
  Filled 2014-04-03: qty 50

## 2014-04-03 MED ORDER — POTASSIUM CHLORIDE CRYS ER 20 MEQ PO TBCR
40.0000 meq | EXTENDED_RELEASE_TABLET | Freq: Two times a day (BID) | ORAL | Status: AC
  Administered 2014-04-03 (×2): 40 meq via ORAL
  Filled 2014-04-03 (×2): qty 2

## 2014-04-03 NOTE — Progress Notes (Signed)
CRITICAL VALUE ALERT  Critical value received:  Potassium 2.9  Date of notification:  6/16  Time of notification:  1624  Critical value read back:yes  Nurse who received alert:  Marisue Brooklyn  MD notified (1st page):  Dr Alva/Dr Joelyn Oms  Time of first page: 58  MD notified (2nd page):  Time of second page:  Responding MD:  Dr Joelyn Oms  Time MD responded:  1700

## 2014-04-03 NOTE — Progress Notes (Signed)
CRITICAL VALUE ALERT  Critical value received:  K+ 2.8, Ca=4.9  Date of notification:  04/03/2014   Time of notification:  0333  Critical value read back:yes  Nurse who received alert:  Corrinne Eagle RN  MD notified (1st page):  Dr. Jonnie Finner  Time of first page:  0340  MD notified (2nd page):Dr. Lake Bells  Time of second page:0420  Responding MD:  Lake Bells  Time MD responded:  [phone]

## 2014-04-03 NOTE — Progress Notes (Signed)
Subjective: Starting to feel better Not nauseous this AM A little depressed Has tolerated CRRT - keeping even - all 4K fluids (has required K replacement) BP elevated  Objective:   Vital signs in last 24 hours: Filed Vitals:   04/03/14 0400 04/03/14 0500 04/03/14 0600 04/03/14 0700  BP: 153/102 149/96 120/100 158/113  Pulse: 91 91 97 101  Temp: 97.7 F (36.5 C)     TempSrc: Oral     Resp: 13 10 14 16   Height:      Weight:  74.2 kg (163 lb 9.3 oz)    SpO2: 100% 100% 100% 100%   Weight change:   Intake/Output Summary (Last 24 hours) at 04/03/14 0738 Last data filed at 04/03/14 0700  Gross per 24 hour  Intake 1888.33 ml  Output   2200 ml  Net -311.67 ml    Physical Exam:  Blood pressure 158/113, pulse 101, temperature 97.7 F (36.5 C), temperature source Oral, resp. rate 16, height 5\' 9"  (1.753 m), weight 74.2 kg (163 lb 9.3 oz), SpO2 100.00%. Thin WM Much less hyperpneic and resting comfortably Skin with multiple lesions Lungs clear No pericardial rub Abd soft 1+ edema  Labs:  Recent Labs Lab 04/02/14 1228 04/02/14 1557 04/02/14 1800 04/02/14 2140 04/03/14 0245 04/03/14 0500  NA 135* 135* 137 138 140 140  K 4.1 3.9 3.6* 2.9* 2.8* 2.8*  CL 90* 92* 94* 95* 97 97  CO2 <7* <7* <7* 9* 13* 17*  GLUCOSE 184* 139* 152* 143* 117* 146*  BUN 202* 201* 197* 179* 150* 133*  CREATININE 25.35* 24.55* 22.56* 18.59* 15.74* 13.88*  CALCIUM 4.1* 4.0* <4.0* 4.3* 4.9* 5.0*  PHOS 14.0* 13.1*  --   --   --  4.0     Recent Labs Lab 04/02/14 1557 04/03/14 0500  ALBUMIN 3.6 3.3*    Recent Labs Lab 04/02/14 1228 04/03/14 0500  WBC 16.0* 9.3  NEUTROABS 14.9*  --   HGB 9.7* 7.7*  HCT 27.6* 21.6*  MCV 83.9 80.3  PLT 341 242    Recent Labs Lab 04/02/14 1645  GLUCAP 132*      Recent Labs Lab 04/02/14 1557  IRON 178*  TIBC NOT CALC  FERRITIN 383*    Studies/Results: US Abdomen Complete  04/03/2014   CLINICAL DATA:  Polycystic kidney disease.  Polycystic  liver.  EXAM: ULTRASOUND ABDOMEN COMPLETE  COMPARISON:  None.  FINDINGS: Gallbladder:  Suboptimally visualized due to renal cysts. No stones. Wall thickness 2 mm. No sonographic Murphy sign.  Common bile duct:  Diameter: 5 mm, normal for age.  Liver:  Multiple cysts.  The largest is 18 mm.  No solid mass lesions.  IVC:  No abnormality visualized.  Pancreas:  Visualized portion unremarkable.  Spleen:  6.6 cm.  Normal echotexture.  Right Kidney:  Length: 24.9 cm.  Polycystic Kidney.  Largest cyst measures 54 mm.  Left Kidney:  Length: 24.2 cm.  Polycystic kidney.  Largest cyst measures 64 mm  Abdominal aorta:  Poorly visualized due to enlarged kidneys.  Other findings:  None.  IMPRESSION: Polycystic liver and kidneys.  No acute abnormality.  No gallstones.   Electronically Signed   By: Dereck Ligas M.D.   On: 04/03/2014 00:26   Dg Chest Portable 1 View  04/02/2014   CLINICAL DATA:  Central catheter placed  EXAM: PORTABLE CHEST - 1 VIEW  COMPARISON:  Study obtained earlier in the day  FINDINGS: Central catheter tip is in the superior vena cava. No pneumothorax. There  is no edema or consolidation. Heart size and pulmonary vascularity are normal. No adenopathy. No bone lesions.  IMPRESSION: Central catheter tip in superior vena cava. No pneumothorax. Lungs clear.   Electronically Signed   By: Lowella Grip M.D.   On: 04/02/2014 15:58   Dg Chest Port 1 View  04/02/2014   CLINICAL DATA:  Chronic renal insufficiency and polycystic kidney disease  EXAM: PORTABLE CHEST - 1 VIEW  COMPARISON:  None.  FINDINGS: The lungs are mildly hypoinflated which accentuates the perihilar interstitial markings.The cardiac silhouette is top-normal in size. The pulmonary vascularity is not clearly engorged. The observed bony structures are unremarkable.  IMPRESSION: Mildly increased perihilar lung markings may be due to vascular crowding or could reflect subsegmental atelectasis. Otherwise there is no acute cardiopulmonary  abnormality.   Electronically Signed   By: David  Martinique   On: 04/02/2014 13:07    . heparin 10,000 units/ 20 mL infusion syringe 1,350 Units/hr (04/03/14 0708)  . dialysis replacement fluid (prismasate) 200 mL/hr at 04/02/14 1718  . dialysis replacement fluid (prismasate) 200 mL/hr at 04/02/14 1718  . dialysate (PRISMASATE) 1,000 mL/hr at 04/03/14 0406  .  sodium bicarbonate  infusion 1000 mL 100 mL/hr at 04/03/14 0258   . antiseptic oral rinse  15 mL Mouth Rinse BID  . calcium carbonate  800 mg of elemental calcium Oral QID  . famotidine (PEPCID) IV  20 mg Intravenous Q24H  . heparin  5,000 Units Subcutaneous 3 times per day  . pneumococcal 23 valent vaccine  0.5 mL Intramuscular Tomorrow-1000  . potassium chloride  10 mEq Intravenous Q1 Hr x 4     I  have reviewed scheduled and prn medications.  ASSESSMENT/RECOMMENDATIONS Impression/Plan  1. 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 Multiple family members with ADPKD. Markedly enlarged and cystic kidneys. Cystic liver. Numbers markedly better with CRRT.  Acidosis better. Stop bicarb drip. Stop CRRT at 2 PM today.  Additional K repletion orally. Change tums to phoslo with meals and start renal diet. Transition to IHD starting tomorrow.  Consult to [facility] for AVF+TDC. CLIP. Save left arm 2. Metabolic acidosis - much improved with bicarb drip/CRRT; discontinue both today. 3. Hypocalcemia with hyperphosphatemia - started calcium based phos binders.  CRRT has corrected hyperphosphatemia nicely.  Change to Phoslo. PTH pending. 4. Anemia - Iron stores adequate; starting aranesp 5. Skin Hill[place] - unclear etiology; can't say this is all related to uremia 6. Hypertension - meds can be started; will use po lopressor with baseline tachycardia 7. leukocytosis - cultures to be done but no ATB's; WBC back to normal today      Jamal Maes, MD [place] Pager 04/03/2014, 7:38 AM

## 2014-04-03 NOTE — Progress Notes (Signed)
[facility] Progress Note Patient Name: Jonatham Grime DOB: [birth year] MRN: [id number]  Date of Service  04/03/2014   HPI/Events of Note     eICU Interventions  Hypokalemia, repleted    Intervention Category Minor Interventions: Electrolytes abnormality - evaluation and management  MCQUAID, DOUGLAS 04/03/2014, 4:12 AM

## 2014-04-03 NOTE — Care Management Note (Signed)
    Page 1 of 1   04/03/2014     7:45:52 AM CARE MANAGEMENT NOTE 04/03/2014  Patient:  Kurowski,Algis   Account Number:  [id number]  Date Initiated:  04/03/2014  Documentation initiated by:  Elissa Hefty  Subjective/Objective Assessment:   adm w renal failure     Action/Plan:   lives w fam   Anticipated DC Date:     Anticipated DC Plan:           Choice offered to / List presented to:             Status of service:   Medicare Important Message given?   (If response is "NO", the following Medicare IM given date fields will be blank) Date Medicare IM given:   Date Additional Medicare IM given:    Discharge Disposition:    Per UR Regulation:  Reviewed for med. necessity/level of care/duration of stay  If discussed at [place] of Stay Meetings, dates discussed:    Comments:

## 2014-04-03 NOTE — Progress Notes (Signed)
PULMONARY / CRITICAL CARE MEDICINE   Name: [name] [name] MRN: [id number] DOB: [birth year]    ADMISSION DATE:  04/02/2014  REFERRING MD :  Dr. Christy Gentles  PRIMARY SERVICE: PCCM  CHIEF COMPLAINT:  Metabolic acidosis  BRIEF PATIENT DESCRIPTION: 53 yo with polycystic kidney disease admitted 6/15 with profound metabolic acidosis and acute renal failure.    SIGNIFICANT EVENTS / STUDIES:  6/15  Admitted with acidosis / renal failure,CRRT started  LINES / TUBES: R IJ HD cath 6/15 >>  CULTURES: 6/15  MRSA PCR >>> neg 6/15  HIV >>> neg 6/15  Blood >>> 6/15  Urine >>>  ANTIBIOTICS:  INTERVAL HISTORY:  Labs improved overnight  VITAL SIGNS: Temp:  [97.5 F (36.4 C)-97.8 F (36.6 C)] 97.7 F (36.5 C) (06/16 0400) Pulse Rate:  [81-101] 101 (06/16 0700) Resp:  [10-23] 16 (06/16 0700) BP: (120-173)/(76-144) 158/113 mmHg (06/16 0700) SpO2:  [100 %] 100 % (06/16 0700) Weight:  [74.2 kg (163 lb 9.3 oz)-76.204 kg (168 lb)] 74.2 kg (163 lb 9.3 oz) (06/16 0500)  HEMODYNAMICS:    VENTILATOR SETTINGS:    INTAKE / OUTPUT: Intake/Output     06/15 0701 - 06/16 0700 06/16 0701 - 06/17 0700   I.V. (mL/kg) 1538.3 (20.7)    IV Piggyback 350    Total Intake(mL/kg) 1888.3 (25.4)    Urine (mL/kg/hr) 743    Other 1457    Total Output 2200     Net -311.7            PHYSICAL EXAMINATION: General:  No distress Neuro:  Awake, somewhat sluggish mentation but appropriate HEENT:  No JVD Cardiovascular:  Regular, no murmurs Lungs:  CTAB Abdomen:  Soft, non tender Musculoskeletal:  Moves all estremities Skin:  Intact  LABS:  CBC  Recent Labs Lab 04/02/14 1228 04/03/14 0500  WBC 16.0* 9.3  HGB 9.7* 7.7*  HCT 27.6* 21.6*  PLT 341 242   Coag's  Recent Labs Lab 04/02/14 1237 04/03/14 0500  APTT 33 197*  INR 1.51*  --    BMET  Recent Labs Lab 04/02/14 2140 04/03/14 0245 04/03/14 0500  NA 138 140 140  K 2.9* 2.8* 2.8*  CL 95* 97 97  CO2 9* 13* 17*  BUN 179* 150* 133*   CREATININE 18.59* 15.74* 13.88*  GLUCOSE 143* 117* 146*   Electrolytes  Recent Labs Lab 04/02/14 1228 04/02/14 1557  04/02/14 2140 04/03/14 0245 04/03/14 0500  CALCIUM 4.1* 4.0*  < > 4.3* 4.9* 5.0*  MG  --   --   --   --   --  1.5  PHOS 14.0* 13.1*  --   --   --  4.0  < > = values in this interval not displayed. Sepsis Markers  Recent Labs Lab 04/02/14 1304  LATICACIDVEN 1.10   ABG  Recent Labs Lab 04/02/14 1555  PHART 7.181*  PCO2ART 13.6*  PO2ART 117.0*   Liver Enzymes  Recent Labs Lab 04/02/14 1557 04/03/14 0500  ALBUMIN 3.6 3.3*   Cardiac Enzymes  Recent Labs Lab 04/02/14 1228  PROBNP >70000.0*   Glucose  Recent Labs Lab 04/02/14 1645  GLUCAP 132*   IMAGING:   US Abdomen Complete  04/03/2014   CLINICAL DATA:  Polycystic kidney disease.  Polycystic liver.  EXAM: ULTRASOUND ABDOMEN COMPLETE  COMPARISON:  None.  FINDINGS: Gallbladder:  Suboptimally visualized due to renal cysts. No stones. Wall thickness 2 mm. No sonographic Murphy sign.  Common bile duct:  Diameter: 5 mm, normal for age.  Liver:  Multiple cysts.  The largest is 18 mm.  No solid mass lesions.  IVC:  No abnormality visualized.  Pancreas:  Visualized portion unremarkable.  Spleen:  6.6 cm.  Normal echotexture.  Right Kidney:  Length: 24.9 cm.  Polycystic Kidney.  Largest cyst measures 54 mm.  Left Kidney:  Length: 24.2 cm.  Polycystic kidney.  Largest cyst measures 64 mm  Abdominal aorta:  Poorly visualized due to enlarged kidneys.  Other findings:  None.  IMPRESSION: Polycystic liver and kidneys.  No acute abnormality.  No gallstones.   Electronically Signed   By: Dereck Ligas M.D.   On: 04/03/2014 00:26   Dg Chest Portable 1 View  04/02/2014   CLINICAL DATA:  Central catheter placed  EXAM: PORTABLE CHEST - 1 VIEW  COMPARISON:  Study obtained earlier in the day  FINDINGS: Central catheter tip is in the superior vena cava. No pneumothorax. There is no edema or consolidation. Heart size  and pulmonary vascularity are normal. No adenopathy. No bone lesions.  IMPRESSION: Central catheter tip in superior vena cava. No pneumothorax. Lungs clear.   Electronically Signed   By: Lowella Grip M.D.   On: 04/02/2014 15:58   Dg Chest Port 1 View  04/02/2014   CLINICAL DATA:  Chronic renal insufficiency and polycystic kidney disease  EXAM: PORTABLE CHEST - 1 VIEW  COMPARISON:  None.  FINDINGS: The lungs are mildly hypoinflated which accentuates the perihilar interstitial markings.The cardiac silhouette is top-normal in size. The pulmonary vascularity is not clearly engorged. The observed bony structures are unremarkable.  IMPRESSION: Mildly increased perihilar lung markings may be due to vascular crowding or could reflect subsegmental atelectasis. Otherwise there is no acute cardiopulmonary abnormality.   Electronically Signed   By: David  Martinique   On: 04/02/2014 13:07   ASSESSMENT / PLAN:  PULMONARY A: Acute respiratory failure in setting of severe metabolic acidosis, improved P:   Goal SpO2>92 Supplemental opxygen  CARDIOVASCULAR A:  Tachycardia  Hypertension  P:  Goal BP<160/90 Metoprolol ( new )  RENAL A:   Acute renal failure Polycystic kidney disease Severe metabolic acidosis Hypokalemia Hypomagnesemia Hypocalcemia  P:   Renal following CRRT Bicarbonate gtt d/c'd Mg 2 K 40 x 2 + 10 x 4 PhosLo  GASTROINTESTINAL A:   Nausea / vomiting / diarrhea GI Px is not indicated P:   D/c Pepcid   HEMATOLOGIC A:   Anemia Coagulopathy ( partly secondary to Heparin in CRRY circuit ), but does not explain elevated INR VTE Px P:  Trend CBC/ APTT / INR Heparin  Aranesp  INFECTIOUS A:   R/O Infectious Process as Precipitating Factor for AKI P:   Cx as above Defer abx  ENDOCRINE A:   No active issues P:   No intervention required  NEUROLOGIC A:   Acute metabolic encephalopathy, improved P:   No intervention required  I have personally obtained  history, examined patient, evaluated and interpreted laboratory and imaging results, reviewed medical records, formulated assessment / plan and placed orders.   CRITICAL CARE:  The patient is critically ill with multiple organ systems failure and requires high complexity decision making for assessment and support, frequent evaluation and titration of therapies, application of advanced monitoring technologies and extensive interpretation of multiple databases. Critical Care Time devoted to patient care services described in this note is 35 minutes.   Doree Fudge, MD Pulmonary and [place] Pager: [phone]  04/03/2014, 8:27 AM

## 2014-04-03 NOTE — Progress Notes (Addendum)
INITIAL NUTRITION ASSESSMENT  DOCUMENTATION CODES Per approved criteria  -Not Applicable   INTERVENTION:  Recommend liberalize diet to low sodium; patient does not need a potassium or phosphorus restriction at this time  Ensure Complete PO BID, each supplement provides 350 kcal and 13 grams of protein  NUTRITION DIAGNOSIS: Inadequate oral intake related to poor appetite as evidenced by weight loss PTA.   Goal: Intake to meet >90% of estimated nutrition needs.  Monitor:  PO intake, labs, weight trend.  Reason for Assessment: MST  53 y.o. male  Admitting Dx: Metabolic acidosis  ASSESSMENT: 53 yo with polycystic kidney disease admitted 6/15 with profound metabolic acidosis and acute renal failure.   Potassium is low, phosphorus is WNL. No dietary restriction of these needed at this time. Recommend liberalize diet to low sodium.  Patient reports that he has been having a lot of diarrhea for the past 2 weeks and has been losing a lot of weight as a result. His usual weight is ~180 lb. He does not like the hospital food, so he did not eat lunch today. Agreed to try a supplement to help maximize oral intake.  Unable to complete nutrition focused physical exam at this time.  Height: Ht Readings from Last 1 Encounters:  04/02/14 5\' 9"  (1.753 m)    Weight: Wt Readings from Last 1 Encounters:  04/03/14 163 lb 9.3 oz (74.2 kg)    Ideal Body Weight: 72.7 kg  % Ideal Body Weight: 102%  Wt Readings from Last 10 Encounters:  04/03/14 163 lb 9.3 oz (74.2 kg)  04/02/14 168 lb (76.204 kg)    Usual Body Weight: 180 lb 2-3 weeks ago per patient  % Usual Body Weight: 91%  BMI:  Body mass index is 24.15 kg/(m^2).  Estimated Nutritional Needs: Kcal: 2200-2400 Protein: 111-148 gm Fluid: 2.2 L  Skin: no wounds  Diet Order: Renal with 1200 ml fluid restriction  EDUCATION NEEDS: -Education not appropriate at this time   Intake/Output Summary (Last 24 hours) at 04/03/14  1439 Last data filed at 04/03/14 1400  Gross per 24 hour  Intake 2316.66 ml  Output   3511 ml  Net -1194.34 ml    Last BM: 6/16   Labs:   Recent Labs Lab 04/02/14 1228 04/02/14 1557  04/02/14 2140 04/03/14 0245 04/03/14 0500  NA 135* 135*  < > 138 140 140  K 4.1 3.9  < > 2.9* 2.8* 2.8*  CL 90* 92*  < > 95* 97 97  CO2 <7* <7*  < > 9* 13* 17*  BUN 202* 201*  < > 179* 150* 133*  CREATININE 25.35* 24.55*  < > 18.59* 15.74* 13.88*  CALCIUM 4.1* 4.0*  < > 4.3* 4.9* 5.0*  MG  --   --   --   --   --  1.5  PHOS 14.0* 13.1*  --   --   --  4.0  GLUCOSE 184* 139*  < > 143* 117* 146*  < > = values in this interval not displayed.  CBG (last 3)   Recent Labs  04/02/14 1645  GLUCAP 132*    Scheduled Meds: . calcium acetate  1,334 mg Oral TID WC  . [START ON 04/04/2014] darbepoetin (ARANESP) injection - DIALYSIS  200 mcg Intravenous Q Wed-HD  . heparin  5,000 Units Subcutaneous 3 times per day  . metoprolol tartrate  25 mg Oral BID  . potassium chloride  40 mEq Oral BID WC    Continuous Infusions:  Past Medical History  Diagnosis Date  . Chronic kidney disease   . Polycystic kidney disease   . Leucocytosis   . Hypertension   . Anemia   . Shortness of breath     Past Surgical History  Procedure Laterality Date  . Nose surgery    . Central venous catheter insertion  04/02/2014    DR Fransisca Connors, RD, LDN, [place] Pager [phone] After Hours Pager [phone]

## 2014-04-03 NOTE — Consult Note (Signed)
[facility] NOTE   MRN : [id number]  Reason for Consult: Dialysis access Referring Physician: Lucrezia Starch, MD  History of Present Illness: 53 y/o male with chronic kidney disease.  He has not been followed by nephrology because he has chosen no to.  He has polycystic kidney disease and has a family history of it both his father and sister.  He was brought to the ER on 04/02/2014 with complaints of shortness of breath, nausea, vomiting and diarrhea.   He has a temporary right IJ catheter and multiple IV's in the right arm.  He is right hand dominant.  His creatinine is 13.88 down from admitting values of 25.  He states he has no other medical conditions and takes no medications.  He denise DM. HTN, and hyperlipidemia.   Current Facility-Administered Medications  Medication Dose Route Frequency Provider Last Rate Last Dose  . 0.9 %  sodium chloride infusion  250 mL Intravenous PRN Donita Brooks, NP      . calcium acetate (PHOSLO) capsule 1,334 mg  1,334 mg Oral TID WC Lucrezia Starch, MD   1,334 mg at 04/03/14 1111  . [START ON 04/04/2014] darbepoetin (ARANESP) injection 200 mcg  200 mcg Intravenous Q Wed-HD Lucrezia Starch, MD      . feeding supplement (ENSURE COMPLETE) (ENSURE COMPLETE) liquid 237 mL  237 mL Oral BID BM Dalene Carrow, RD      . heparin injection 5,000 Units  5,000 Units Subcutaneous 3 times per day Donita Brooks, NP   5,000 Units at 04/03/14 1311  . metoprolol tartrate (LOPRESSOR) tablet 25 mg  25 mg Oral BID Lucrezia Starch, MD   25 mg at 04/03/14 0912  . ondansetron (ZOFRAN) injection 4 mg  4 mg Intravenous Q6H PRN Donita Brooks, NP      . potassium chloride SA (K-DUR,KLOR-CON) CR tablet 40 mEq  40 mEq Oral BID WC Lucrezia Starch, MD   40 mEq at 04/03/14 0910   Pt meds include: Statin :No Betablocker: No ASA: No Other anticoagulants/antiplatelets:   Past Medical History  Diagnosis Date  . Chronic kidney  disease   . Polycystic kidney disease   . Leucocytosis   . Hypertension   . Anemia   . Shortness of breath    Past Surgical History  Procedure Laterality Date  . Nose surgery    . Central venous catheter insertion  04/02/2014    DR Titus Mould   Social History History  Substance Use Topics  . Smoking status: Former Research scientist (life sciences)  . Smokeless tobacco: Never Used     Comment: QUIT SMOKING CIGARETTES  IN 1994      QUIT SMOKING POT   . Alcohol Use: No   Family History Family History  Problem Relation Age of Onset  . Polycystic kidney disease Father   . Polycystic kidney disease Sister    No Known Allergies  REVIEW OF SYSTEMS  General: [ ]  Weight loss, [ ]  Fever, [ ]  chills Neurologic: [ ]  Dizziness, [ ]  Blackouts, [ ]  Seizure [ ]  Stroke, [ ]  "Mini stroke", [ ]  Slurred speech, [ ]  Temporary blindness; [ ]  weakness in arms or legs, [ ]  Hoarseness [ ]  Dysphagia Cardiac: [ ]  Chest pain/pressure, [x ] Shortness of breath at rest [x ] Shortness of breath with exertion, [ ]  Atrial fibrillation or irregular heartbeat  Vascular: [ ]  Pain in legs with walking, [ ]  Pain in legs at  rest, [ ]  Pain in legs at night,  [ ]  Non-healing ulcer, [ ]  Blood clot in vein/DVT,   Pulmonary: [ ]  Home oxygen, [ ]  Productive cough, [ ]  Coughing up blood, [ ]  Asthma,  [ ]  Wheezing [ ]  COPD Musculoskeletal:  [ ]  Arthritis, [ ]  Low back pain, [ ]  Joint pain Hematologic: [x ] Easy Bruising, [ ]  Anemia; [ ]  Hepatitis Gastrointestinal: [ ]  Blood in stool, [ ]  Gastroesophageal Reflux/heartburn, Urinary: [x ] chronic Kidney disease, [ ]  on HD - [ ]  MWF or [ ]  TTHS, [ ]  Burning with urination, [ ]  Difficulty urinating Skin: [x ] Rashes, [ ]  Wounds [x]  multiple small scabs, sores on his arms. Psychological: [ ]  Anxiety, [ ]  Depression  Physical Examination Filed Vitals:   04/03/14 1200 04/03/14 1300 04/03/14 1400 04/03/14 1500  BP: 133/88 132/92 135/88 119/68  Pulse:  88    Temp: 97.5 F (36.4 C)     TempSrc: Oral      Resp:    15  Height:      Weight:      SpO2:  100% 100% 100%   Body mass index is 24.15 kg/(m^2).  General:  WDWN in NAD Gait: Normal HENT: WNL Eyes: Pupils equal Pulmonary: normal labored breathing with pursed lips, without Rales, rhonchi,  wheezing Cardiac: RRR, without  Murmurs, rubs or gallops; No carotid bruits Abdomen: distended, NT, no masses Skin: no rashes, ulcers noted;  no Gangrene , no cellulitis; no open wounds;   Vascular Exam/Pulses:Palpable left radial and brachial pulses, left radial pulse palpable.  Palpable PT, and femoral pulses.    Musculoskeletal: no muscle wasting or atrophy; no edema  Neurologic: A&O X 3; Appropriate Affect ;  SENSATION: normal; MOTOR FUNCTION: 5/5 Symmetric Speech is fluent/normal  Significant Diagnostic Studies: CBC Lab Results  Component Value Date   WBC 9.3 04/03/2014   HGB 7.7* 04/03/2014   HCT 21.6* 04/03/2014   MCV 80.3 04/03/2014   PLT 242 04/03/2014    BMET    Component Value Date/Time   NA 140 04/03/2014 0500   K 2.8* 04/03/2014 0500   CL 97 04/03/2014 0500   CO2 17* 04/03/2014 0500   GLUCOSE 146* 04/03/2014 0500   BUN 133* 04/03/2014 0500   CREATININE 13.88* 04/03/2014 0500   CALCIUM 5.0* 04/03/2014 0500   GFRNONAA 4* 04/03/2014 0500   GFRAA 4* 04/03/2014 0500   Estimated Creatinine Clearance: 6.3 ml/min (by C-G formula based on Cr of 13.88).  COAG Lab Results  Component Value Date   INR 1.51* 04/02/2014   Non-Invasive Vascular Imaging:  Pending vein mapping  ASSESSMENT:  CKD (no treatment to date) his sister is on dialysis. Metabolic Acidosis Anemia secondary to CKD Hypertension currently being treated Lopressor  PLAN: Diatek placement and fistula verses graft pending results of the vein mapping.    Laurence Slate [facility] 04/03/2014 3:50 PM  Agree with above. Vein map is pending. Tentatively plan for a left AV fistula and tunneled dialysis catheter on Thursday.  Deitra Mayo, MD, [place]  [phone] 04/03/2014

## 2014-04-03 NOTE — Progress Notes (Signed)
CRITICAL VALUE ALERT  Critical value received:  Potassium 2.9,co2 9, Calcium 4.3  Date of notification:  04/02/14  Time of notification:  2233  Critical value read back:yes  Nurse who received alert:  Corrinne Eagle RN  MD notified (1st page):  Dr. Zachary George  Calling renal)  Time of first page:  2240  MD notified (2nd page):Dr. Jonnie Finner  Time of second page:2250  Responding MD:  Jonnie Finner  Time MD responded:  2320

## 2014-04-03 NOTE — ED Provider Notes (Signed)
I have personally seen and examined the patient.  I have discussed the plan of care with the resident.  I have reviewed the documentation on PMH/FH/Soc. History.  I have reviewed the documentation of the resident and agree.  CRITICAL CARE Performed by: Sharyon Cable Total critical care time: 35 Critical care time was exclusive of separately billable procedures and treating other patients. Critical care was necessary to treat or prevent imminent or life-threatening deterioration. Critical care was time spent personally by me on the following activities: development of treatment plan with patient and/or surrogate as well as nursing, discussions with consultants, evaluation of patient's response to treatment, examination of patient, obtaining history from patient or surrogate, ordering and performing treatments and interventions, ordering and review of laboratory studies, ordering and review of radiographic studies, pulse oximetry and re-evaluation of patient's condition.   Sharyon Cable, MD 04/03/14 [phone]

## 2014-04-03 NOTE — Progress Notes (Signed)
CRITICAL VALUE ALERT  Critical value received:  K 2.8, Calcium 5.0  Date of notification:  04/03/2014   Time of notification:  0703  Critical value read back:yes  Nurse who received alert:  Corrinne Eagle RN  MD notified (1st page):  Dr. Lorrene Reid  Time of first page:  Spoke w/md on unit at 0730  MD notified (2nd page):na  Time of second page:na  Responding MD:  Lorrene Reid  Time MD responded:  [id number]

## 2014-04-04 DIAGNOSIS — E872 Acidosis, unspecified: Secondary | ICD-10-CM | POA: Diagnosis present

## 2014-04-04 DIAGNOSIS — I10 Essential (primary) hypertension: Secondary | ICD-10-CM | POA: Diagnosis present

## 2014-04-04 DIAGNOSIS — N19 Unspecified kidney failure: Secondary | ICD-10-CM

## 2014-04-04 DIAGNOSIS — G9340 Encephalopathy, unspecified: Secondary | ICD-10-CM

## 2014-04-04 LAB — URINE CULTURE
Colony Count: NO GROWTH
Culture: NO GROWTH

## 2014-04-04 LAB — APTT: aPTT: 31 s (ref 24–37)

## 2014-04-04 LAB — COMPREHENSIVE METABOLIC PANEL
ALT: 19 U/L (ref 0–53)
AST: 28 U/L (ref 0–37)
Albumin: 3 g/dL — ABNORMAL LOW (ref 3.5–5.2)
Alkaline Phosphatase: 119 U/L — ABNORMAL HIGH (ref 39–117)
BUN: 129 mg/dL — ABNORMAL HIGH (ref 6–23)
CALCIUM: 5 mg/dL — AB (ref 8.4–10.5)
CO2: 16 meq/L — AB (ref 19–32)
Chloride: 100 mEq/L (ref 96–112)
Creatinine, Ser: 13.29 mg/dL — ABNORMAL HIGH (ref 0.50–1.35)
GFR, EST AFRICAN AMERICAN: 4 mL/min — AB (ref >90)
GFR, EST NON AFRICAN AMERICAN: 4 mL/min — AB (ref >90)
GLUCOSE: 187 mg/dL — AB (ref 70–99)
Potassium: 3.4 mEq/L — ABNORMAL LOW (ref 3.7–5.3)
Sodium: 140 mEq/L (ref 137–147)
Total Bilirubin: 0.5 mg/dL (ref 0.3–1.2)
Total Protein: 6 g/dL (ref 6.0–8.3)

## 2014-04-04 LAB — HEPATITIS B SURFACE ANTIGEN: Hepatitis B Surface Ag: NEGATIVE

## 2014-04-04 LAB — CBC
HCT: 20.8 % — ABNORMAL LOW (ref 39.0–52.0)
Hemoglobin: 7.3 g/dL — ABNORMAL LOW (ref 13.0–17.0)
MCH: 29.8 pg (ref 26.0–34.0)
MCHC: 35.1 g/dL (ref 30.0–36.0)
MCV: 84.9 fL (ref 78.0–100.0)
Platelets: 203 10*3/uL (ref 150–400)
RBC: 2.45 MIL/uL — ABNORMAL LOW (ref 4.22–5.81)
RDW: 14.6 % (ref 11.5–15.5)
WBC: 9.6 10*3/uL (ref 4.0–10.5)

## 2014-04-04 LAB — MAGNESIUM: Magnesium: 2.2 mg/dL (ref 1.5–2.5)

## 2014-04-04 LAB — PROTIME-INR
INR: 1.24 (ref 0.00–1.49)
PROTHROMBIN TIME: 15.3 s — AB (ref 11.6–15.2)

## 2014-04-04 LAB — HEPATITIS B CORE ANTIBODY, TOTAL: Hep B Core Total Ab: NONREACTIVE

## 2014-04-04 LAB — POTASSIUM: Potassium: 3.3 meq/L — ABNORMAL LOW (ref 3.7–5.3)

## 2014-04-04 LAB — HEPATITIS B SURFACE ANTIBODY,QUALITATIVE: HEP B S AB: NEGATIVE

## 2014-04-04 MED ORDER — CEFAZOLIN SODIUM 1-5 GM-% IV SOLN
1.0000 g | INTRAVENOUS | Status: AC
Start: 2014-04-05 — End: 2014-04-06
  Filled 2014-04-04 (×2): qty 50

## 2014-04-04 MED ORDER — POTASSIUM CHLORIDE CRYS ER 20 MEQ PO TBCR
40.0000 meq | EXTENDED_RELEASE_TABLET | Freq: Once | ORAL | Status: AC
  Administered 2014-04-04: 40 meq via ORAL
  Filled 2014-04-04: qty 2

## 2014-04-04 MED ORDER — DARBEPOETIN ALFA-POLYSORBATE 200 MCG/0.4ML IJ SOLN
INTRAMUSCULAR | Status: AC
  Administered 2014-04-04: 200 ug via INTRAVENOUS
  Filled 2014-04-04: qty 0.4

## 2014-04-04 MED ORDER — DOXERCALCIFEROL 4 MCG/2ML IV SOLN
INTRAVENOUS | Status: AC
  Administered 2014-04-04: 2 ug via INTRAVENOUS
  Filled 2014-04-04: qty 2

## 2014-04-04 MED ORDER — DOXERCALCIFEROL 4 MCG/2ML IV SOLN
2.0000 ug | INTRAVENOUS | Status: DC
  Administered 2014-04-04 – 2014-04-09 (×3): 2 ug via INTRAVENOUS
  Filled 2014-04-04 (×3): qty 2

## 2014-04-04 NOTE — Progress Notes (Signed)
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 Multiple family members with ADPKD. Markedly enlarged and cystic kidneys. Cystic liver. Numbers markedly better with CRRT (stopped 6/16)  Acidosis better. Low K - needs recheck today. 4K bath with HD.   Changed tums to phoslo with meals and started renal diet. Transition to IHD today.  Consulted  VVS for AVF+TDC (tentative Thursday per pt). CLIP process started. Vein mapping done. Saving left arm 2. Metabolic acidosis - much improved with bicarb drip/CRRT; discontinued both  6/16. 3. Hypocalcemia/hyperphosphatemia - started calcium based phos binders.  CRRT corrected hyperphosphatemia nicely.  Changed to Phoslo.  4. Secondary hyperparathyroidism - PTH 1123 - start hectorol 2 mcg with each HD 5. Anemia - Iron stores adequate; starting aranesp 6. Skin Fluckiger - unclear etiology; can't say this is all related to uremia 7. Hypertension - started po lopressor (baseline tachycardia) 8. Leukocytosis - resolved  Subjective:  Feels much better No shortness of breath or pain Says he is to get his AVF and [facility] tomorrow  Objective:   Vital signs in last 24 hours: Filed Vitals:   04/03/14 1800 04/03/14 1929 04/04/14 0511 04/04/14 0952  BP: 129/88 108/82 136/87 115/82  Pulse: 95 90 80   Temp:  97.1 F (36.2 C) 97.8 F (36.6 C) 98 F (36.7 C)  TempSrc:  Oral Oral Oral  Resp: 19 18 18    Height:      Weight:  74.2 kg (163 lb 9.3 oz)    SpO2: 100% 100% 98%    Weight change: -0.8 kg (-1 lb 12.2 oz)  Intake/Output Summary (Last 24 hours) at 04/04/14 1317 Last data filed at 04/04/14 0900  Gross per 24 hour  Intake    640 ml  Output    384 ml  Net    256 ml    Physical Exam:  Blood pressure 115/82, pulse 80, temperature 98 F (36.7 C), temperature source Oral, resp. rate 18, height 5\' 9"  (1.753 m), weight 74.2 kg (163 lb 9.3 oz), SpO2 98.00%. Thin WM Resting comfortably in dialysis Skin with multiple lesions Lungs  clear No pericardial rub Abd soft Only trace edema  No labs yet today  Recent Labs Lab 04/02/14 1228 04/02/14 1557 04/02/14 1800 04/02/14 2140 04/03/14 0245 04/03/14 0500 04/03/14 1540 04/03/14 2304  NA 135* 135* 137 138 140 140 139  --   K 4.1 3.9 3.6* 2.9* 2.8* 2.8* 2.9* 3.3*  CL 90* 92* 94* 95* 97 97 97  --   CO2 <7* <7* <7* 9* 13* 17* 19  --   GLUCOSE 184* 139* 152* 143* 117* 146* 194*  --   BUN 202* 201* 197* 179* 150* 133* 108*  --   CREATININE 25.35* 24.55* 22.56* 18.59* 15.74* 13.88* 11.37*  --   CALCIUM 4.1* 4.0* <4.0* 4.3* 4.9* 5.0* 5.5*  --   PHOS 14.0* 13.1*  --   --   --  4.0 2.8  --      Recent Labs Lab 04/02/14 1557 04/03/14 0500 04/03/14 1540  ALBUMIN 3.6 3.3* 3.1*    Recent Labs Lab 04/02/14 1228 04/03/14 0500  WBC 16.0* 9.3  NEUTROABS 14.9*  --   HGB 9.7* 7.7*  HCT 27.6* 21.6*  MCV 83.9 80.3  PLT 341 242    Recent Labs Lab 04/02/14 1645  GLUCAP 132*    Recent Labs Lab 04/02/14 1557  IRON 178*  TIBC NOT CALC  FERRITIN 383*   Results for [name], [name] (  MRN [id number]) as of 04/04/2014 13:16  Ref. Range 04/02/2014 15:57  PTH Latest Range: 14.0-72.0 pg/mL 1120.6 (H)   Studies/Results: US Abdomen Complete  04/03/2014   CLINICAL DATA:  Polycystic kidney disease.  Polycystic liver.  EXAM: ULTRASOUND ABDOMEN COMPLETE  COMPARISON:  None.  FINDINGS: Gallbladder:  Suboptimally visualized due to renal cysts. No stones. Wall thickness 2 mm. No sonographic Murphy sign.  Common bile duct:  Diameter: 5 mm, normal for age.  Liver:  Multiple cysts.  The largest is 18 mm.  No solid mass lesions.  IVC:  No abnormality visualized.  Pancreas:  Visualized portion unremarkable.  Spleen:  6.6 cm.  Normal echotexture.  Right Kidney:  Length: 24.9 cm.  Polycystic Kidney.  Largest cyst measures 54 mm.  Left Kidney:  Length: 24.2 cm.  Polycystic kidney.  Largest cyst measures 64 mm  Abdominal aorta:  Poorly visualized due to enlarged kidneys.  Other findings:   None.  IMPRESSION: Polycystic liver and kidneys.  No acute abnormality.  No gallstones.   Electronically Signed   By: Dereck Ligas M.D.   On: 04/03/2014 00:26   Dg Chest Portable 1 View  04/02/2014   CLINICAL DATA:  Central catheter placed  EXAM: PORTABLE CHEST - 1 VIEW  COMPARISON:  Study obtained earlier in the day  FINDINGS: Central catheter tip is in the superior vena cava. No pneumothorax. There is no edema or consolidation. Heart size and pulmonary vascularity are normal. No adenopathy. No bone lesions.  IMPRESSION: Central catheter tip in superior vena cava. No pneumothorax. Lungs clear.   Electronically Signed   By: Lowella Grip M.D.   On: 04/02/2014 15:58      . calcium acetate  1,334 mg Oral TID WC  . [START ON 04/05/2014]  ceFAZolin (ANCEF) IV  1 g Intravenous On Call  . darbepoetin (ARANESP) injection - DIALYSIS  200 mcg Intravenous Q Wed-HD  . feeding supplement (ENSURE COMPLETE)  237 mL Oral BID BM  . heparin  5,000 Units Subcutaneous 3 times per day  . metoprolol tartrate  25 mg Oral BID   I  have reviewed scheduled and prn medications.  Jamal Maes, MD [place] Pager 04/04/2014, 1:17 PM

## 2014-04-04 NOTE — Progress Notes (Signed)
Patient ID: [name] [name]  male  [id number]    DOB: 1961/10/02    DOA: 04/02/2014  PCP: No PCP Per Patient  Patient is a 53 year old male with polycystic kidney disease admitted on 6/15 to critical care service with profound metabolic acidosis and acute renal failure.  Assessment/Plan: Principal Problem:   Acute renal failure, new ESRD, history of polycystic kidney disease - Creatinine 25.35 with BUN of 202 at the time of admission, nephrology was consulted and CRRT started, so far tolerating it - Vein mapping to be done today, vascular surgery consulted and plan for left AV fistula and tunneled dialysis catheter on Thursday  Active Problems:   Polycystic kidney disease - Nephrology consulted, CLIP to be started    Metabolic acidosis with hypokalemia, hypomagnesemia, hypocalcemia - Likely due to #1, repeat CMET today    Essential hypertension, benign - Improved    Acute encephalopathy - Resolved   Leukocytosis : Resolved    DVT Prophylaxis:  Code Status:  Family Communication: Alert and oriented, discussed with patient   Disposition:  Consultants:  Nephrology    vascular surgery  Procedures:  CRRT  Antibiotics:  none  Subjective: Patient seen and examined, denies any new complaints   Objective: Weight change: -0.8 kg (-1 lb 12.2 oz)  Intake/Output Summary (Last 24 hours) at 04/04/14 1313 Last data filed at 04/04/14 0900  Gross per 24 hour  Intake    640 ml  Output    384 ml  Net    256 ml   Blood pressure 115/82, pulse 80, temperature 98 F (36.7 C), temperature source Oral, resp. rate 18, height 5\' 9"  (1.753 m), weight 74.2 kg (163 lb 9.3 oz), SpO2 98.00%.  Physical Exam: General: Alert and awake, oriented x3, not in any acute distress. CVS: S1-S2 clear, no murmur rubs or gallops Chest: clear to auscultation bilaterally, no wheezing, rales or rhonchi Abdomen: soft nontender, nondistended, normal bowel sounds  Extremities: no cyanosis,  clubbing or edema noted bilaterally Neuro: Cranial nerves II-XII intact, no focal neurological deficits   Lab Results: Basic Metabolic Panel:  Recent Labs Lab 04/03/14 0500 04/03/14 1540 04/03/14 2304  NA 140 139  --   K 2.8* 2.9* 3.3*  CL 97 97  --   CO2 17* 19  --   GLUCOSE 146* 194*  --   BUN 133* 108*  --   CREATININE 13.88* 11.37*  --   CALCIUM 5.0* 5.5*  --   MG 1.5  --   --   PHOS 4.0 2.8  --    Liver Function Tests:  Recent Labs Lab 04/03/14 0500 04/03/14 1540  ALBUMIN 3.3* 3.1*   No results found for this basename: LIPASE, AMYLASE,  in the last 168 hours No results found for this basename: AMMONIA,  in the last 168 hours CBC:  Recent Labs Lab 04/02/14 1228 04/03/14 0500  WBC 16.0* 9.3  NEUTROABS 14.9*  --   HGB 9.7* 7.7*  HCT 27.6* 21.6*  MCV 83.9 80.3  PLT 341 242   Cardiac Enzymes: No results found for this basename: CKTOTAL, CKMB, CKMBINDEX, TROPONINI,  in the last 168 hours BNP: No components found with this basename: POCBNP,  CBG:  Recent Labs Lab 04/02/14 1645  GLUCAP 132*     Micro Results: Recent Results (from the past 240 hour(s))  CULTURE, BLOOD (ROUTINE X 2)     Status: None   Collection Time    04/02/14 12:28 PM      Result Value  Ref Range Status   Specimen Description BLOOD RIGHT ARM   Final   Special Requests BOTTLES DRAWN AEROBIC AND ANAEROBIC 10CC   Final   Culture  Setup Time     Final   Value: 04/02/2014 22:51     Performed at [facility]   Culture     Final   Value:        BLOOD CULTURE RECEIVED NO GROWTH TO DATE CULTURE WILL BE HELD FOR 5 DAYS BEFORE ISSUING A FINAL NEGATIVE REPORT     Performed at [facility]   Report Status PENDING   Incomplete  CULTURE, BLOOD (ROUTINE X 2)     Status: None   Collection Time    04/02/14  3:57 PM      Result Value Ref Range Status   Specimen Description BLOOD LEFT ARM   Final   Special Requests BOTTLES DRAWN AEROBIC AND ANAEROBIC 5CC   Final   Culture  Setup  Time     Final   Value: 04/02/2014 22:51     Performed at [facility]   Culture     Final   Value:        BLOOD CULTURE RECEIVED NO GROWTH TO DATE CULTURE WILL BE HELD FOR 5 DAYS BEFORE ISSUING A FINAL NEGATIVE REPORT     Performed at [facility]   Report Status PENDING   Incomplete  URINE CULTURE     Status: None   Collection Time    04/02/14  4:50 PM      Result Value Ref Range Status   Specimen Description URINE, CATHETERIZED   Final   Special Requests NONE   Final   Culture  Setup Time     Final   Value: 04/03/2014 00:19     Performed at Center     Final   Value: NO GROWTH     Performed at [facility]   Culture     Final   Value: NO GROWTH     Performed at [facility]   Report Status 04/04/2014 FINAL   Final  MRSA PCR SCREENING     Status: None   Collection Time    04/02/14  4:52 PM      Result Value Ref Range Status   MRSA by PCR NEGATIVE  NEGATIVE Final   Comment:            The GeneXpert MRSA Assay (FDA     approved for NASAL specimens     only), is one component of a     comprehensive MRSA colonization     surveillance program. It is not     intended to diagnose MRSA     infection nor to guide or     monitor treatment for     MRSA infections.    Studies/Results: US Abdomen Complete  04/03/2014   CLINICAL DATA:  Polycystic kidney disease.  Polycystic liver.  EXAM: ULTRASOUND ABDOMEN COMPLETE  COMPARISON:  None.  FINDINGS: Gallbladder:  Suboptimally visualized due to renal cysts. No stones. Wall thickness 2 mm. No sonographic Murphy sign.  Common bile duct:  Diameter: 5 mm, normal for age.  Liver:  Multiple cysts.  The largest is 18 mm.  No solid mass lesions.  IVC:  No abnormality visualized.  Pancreas:  Visualized portion unremarkable.  Spleen:  6.6 cm.  Normal echotexture.  Right Kidney:  Length: 24.9 cm.  Polycystic Kidney.  Largest  cyst measures 54 mm.  Left Kidney:  Length: 24.2 cm.  Polycystic  kidney.  Largest cyst measures 64 mm  Abdominal aorta:  Poorly visualized due to enlarged kidneys.  Other findings:  None.  IMPRESSION: Polycystic liver and kidneys.  No acute abnormality.  No gallstones.   Electronically Signed   By: Dereck Ligas M.D.   On: 04/03/2014 00:26   Dg Chest Portable 1 View  04/02/2014   CLINICAL DATA:  Central catheter placed  EXAM: PORTABLE CHEST - 1 VIEW  COMPARISON:  Study obtained earlier in the day  FINDINGS: Central catheter tip is in the superior vena cava. No pneumothorax. There is no edema or consolidation. Heart size and pulmonary vascularity are normal. No adenopathy. No bone lesions.  IMPRESSION: Central catheter tip in superior vena cava. No pneumothorax. Lungs clear.   Electronically Signed   By: Lowella Grip M.D.   On: 04/02/2014 15:58   Dg Chest Port 1 View  04/02/2014   CLINICAL DATA:  Chronic renal insufficiency and polycystic kidney disease  EXAM: PORTABLE CHEST - 1 VIEW  COMPARISON:  None.  FINDINGS: The lungs are mildly hypoinflated which accentuates the perihilar interstitial markings.The cardiac silhouette is top-normal in size. The pulmonary vascularity is not clearly engorged. The observed bony structures are unremarkable.  IMPRESSION: Mildly increased perihilar lung markings may be due to vascular crowding or could reflect subsegmental atelectasis. Otherwise there is no acute cardiopulmonary abnormality.   Electronically Signed   By: David  Martinique   On: 04/02/2014 13:07    Medications: Scheduled Meds: . calcium acetate  1,334 mg Oral TID WC  . darbepoetin (ARANESP) injection - DIALYSIS  200 mcg Intravenous Q Wed-HD  . feeding supplement (ENSURE COMPLETE)  237 mL Oral BID BM  . heparin  5,000 Units Subcutaneous 3 times per day  . metoprolol tartrate  25 mg Oral BID      LOS: 2 days   RAI,RIPUDEEP M.D. [facility] 04/04/2014, 1:13 PM Pager: [phone]  If 7PM-7AM, please contact night-coverage [email or web address] Password  [email or web address]  **Disclaimer: This note was dictated with voice recognition software. Similar sounding words can inadvertently be transcribed and this note may contain transcription errors which may not have been corrected upon publication of note.**

## 2014-04-04 NOTE — Progress Notes (Signed)
VASCULAR LAB PRELIMINARY  PRELIMINARY  PRELIMINARY  PRELIMINARY  Right  Upper Extremity Vein Map    Cephalic  Segment Diameter Depth Comment  1. Axilla 1.1 mm mm   2. Mid upper arm 0.86 mm mm   3. Above AC 0.44 mm mm   4. In AC mm mm   5. Below AC mm mm   6. Mid forearm mm mm   7. Wrist mm mm    mm mm    mm mm    mm mm    Basilic  Segment Diameter Depth Comment  1. Axilla 4.52 mm 6.21 mm   2. Mid upper arm 2.83 mm 6.71 mm    3. Above AC 3.1 mm 4.98 mm    4. In AC mm mm Thrombosed   5. Below AC mm mm   6. Mid forearm mm mm   7. Wrist mm mm    mm mm    mm mm    mm mm    Left Upper Extremity Vein Map    Cephalic  Segment Diameter Depth Comment  1. Axilla 1.27 mm mm   2. Mid upper arm 0.74 mm mm   3. Above AC 0.86 mm mm   4. In AC 1.48 mm mm   5. Below AC 0.53 mm mm   6. Mid forearm mm mm   7. Wrist mm mm    mm mm    mm mm    mm mm    Basilic  Segment Diameter Depth Comment  1. Axilla 2.97 mm 4.89 mm   2. Mid upper arm 2.19 mm 10.9 mm   3. Above AC 1.86 mm mm   4. In AC mm mm   5. Below AC mm mm   6. Mid forearm mm mm   7. Wrist mm mm    mm mm    mm mm    mm mm     CESTONE, HELENE, RVT 04/04/2014, 11:29 AM

## 2014-04-04 NOTE — Progress Notes (Signed)
Pt alert and responsive.  Unsteady on feet.  Student in with patient.

## 2014-04-04 NOTE — Procedures (Signed)
I have personally attended this patient's dialysis session.   CRRT stopped 6/16 This is first conventional HD TMT Using temp IJ cath 4K bath with labs pending UF goal 1 liter Tostart Aranesp and hectorol   DUNHAM,CYNTHIA B

## 2014-04-04 NOTE — Progress Notes (Signed)
  [facility] Progress Note  04/04/2014 4:06 PM  Subjective:  Patient seen in hemodialysis. Has no complaints.    Filed Vitals:   04/04/14 1525  BP: 112/78  Pulse: 77  Temp:   Resp:     Physical Exam: A&O male resting comfortably in dialysis in NAD  Neck: temporary right IJ catheter Pulmonary: non-labored breathing   CBC    Component Value Date/Time   WBC 9.6 04/04/2014 1333   WBC 17.0* 04/02/2014 1141   RBC 2.45* 04/04/2014 1333   RBC 3.36* 04/02/2014 1141   HGB 7.3* 04/04/2014 1333   HGB 9.8* 04/02/2014 1141   HCT 20.8* 04/04/2014 1333   HCT 29.4* 04/02/2014 1141   PLT 203 04/04/2014 1333   MCV 84.9 04/04/2014 1333   MCV 87.5 04/02/2014 1141   MCH 29.8 04/04/2014 1333   MCH 29.2 04/02/2014 1141   MCHC 35.1 04/04/2014 1333   MCHC 33.3 04/02/2014 1141   RDW 14.6 04/04/2014 1333   LYMPHSABS 0.5* 04/02/2014 1228   MONOABS 0.6 04/02/2014 1228   EOSABS 0.0 04/02/2014 1228   BASOSABS 0.0 04/02/2014 1228    BMET    Component Value Date/Time   NA 140 04/04/2014 1333   K 3.4* 04/04/2014 1333   CL 100 04/04/2014 1333   CO2 16* 04/04/2014 1333   GLUCOSE 187* 04/04/2014 1333   BUN 129* 04/04/2014 1333   CREATININE 13.29* 04/04/2014 1333   CALCIUM 5.0* 04/04/2014 1333   GFRNONAA 4* 04/04/2014 1333   GFRAA 4* 04/04/2014 1333    INR    Component Value Date/Time   INR 1.24 04/04/2014 1333     Intake/Output Summary (Last 24 hours) at 04/04/14 1606 Last data filed at 04/04/14 0900  Gross per 24 hour  Intake    600 ml  Output    250 ml  Net    350 ml     Assessment:  53 y.o. male with new ESRD and polycystic kidney disease    Plan: OR tomorrow for diatek catheter and left AV fistula vs graft. Vein mapping showed marginal vein diameters. Discussed the options for AV fistula and AV graft at length. The patient currently has a temporary right IJ catheter and has received dialysis today. The procedure, benefits and risks were explained to the patient. Consent order and  NPO past midnight placed.     Virgina Jock, PA-C [facility] Office: [phone] Pager: [phone] 04/04/2014 4:06 PM

## 2014-04-05 ENCOUNTER — Inpatient Hospital Stay (HOSPITAL_COMMUNITY): Payer: Self-pay

## 2014-04-05 ENCOUNTER — Inpatient Hospital Stay (HOSPITAL_COMMUNITY): Payer: Self-pay | Admitting: Anesthesiology

## 2014-04-05 ENCOUNTER — Encounter (HOSPITAL_COMMUNITY)
Admission: EM | Disposition: A | Discharge status: Home / Self Care | Payer: Self-pay | Source: Home / Self Care | Attending: Nephrology

## 2014-04-05 ENCOUNTER — Encounter (HOSPITAL_COMMUNITY): Payer: Self-pay | Admitting: Anesthesiology

## 2014-04-05 ENCOUNTER — Encounter (HOSPITAL_COMMUNITY): Payer: MEDICAID | Admitting: Anesthesiology

## 2014-04-05 ENCOUNTER — Other Ambulatory Visit: Payer: Self-pay | Admitting: *Deleted

## 2014-04-05 DIAGNOSIS — Z4931 Encounter for adequacy testing for hemodialysis: Secondary | ICD-10-CM

## 2014-04-05 DIAGNOSIS — N186 End stage renal disease: Secondary | ICD-10-CM

## 2014-04-05 HISTORY — PX: INSERTION OF DIALYSIS CATHETER: SHX1324

## 2014-04-05 HISTORY — PX: AV FISTULA PLACEMENT: SHX1204

## 2014-04-05 LAB — RENAL FUNCTION PANEL
ALBUMIN: 3 g/dL — AB (ref 3.5–5.2)
BUN: 62 mg/dL — ABNORMAL HIGH (ref 6–23)
CHLORIDE: 101 meq/L (ref 96–112)
CO2: 23 meq/L (ref 19–32)
Calcium: 6.1 mg/dL — CL (ref 8.4–10.5)
Creatinine, Ser: 7.64 mg/dL — ABNORMAL HIGH (ref 0.50–1.35)
GFR calc non Af Amer: 7 mL/min — ABNORMAL LOW (ref >90)
GFR, EST AFRICAN AMERICAN: 8 mL/min — AB (ref >90)
Glucose, Bld: 121 mg/dL — ABNORMAL HIGH (ref 70–99)
POTASSIUM: 3.4 meq/L — AB (ref 3.7–5.3)
Phosphorus: 2.5 mg/dL (ref 2.3–4.6)
SODIUM: 144 meq/L (ref 137–147)

## 2014-04-05 SURGERY — ARTERIOVENOUS (AV) FISTULA CREATION
Anesthesia: Monitor Anesthesia Care | Site: Neck | Laterality: Right

## 2014-04-05 MED ORDER — HYDROMORPHONE HCL PF 1 MG/ML IJ SOLN: 0.2500 mg | INTRAMUSCULAR | Status: DC | PRN

## 2014-04-05 MED ORDER — POTASSIUM CHLORIDE CRYS ER 20 MEQ PO TBCR
40.0000 meq | EXTENDED_RELEASE_TABLET | Freq: Once | ORAL | Status: AC
  Administered 2014-04-05: 40 meq via ORAL
  Filled 2014-04-05: qty 2

## 2014-04-05 MED ORDER — LIDOCAINE HCL (CARDIAC) 20 MG/ML IV SOLN
INTRAVENOUS | Status: AC
  Filled 2014-04-05: qty 5

## 2014-04-05 MED ORDER — GLYCOPYRROLATE 0.2 MG/ML IJ SOLN
INTRAMUSCULAR | Status: AC
  Filled 2014-04-05: qty 1

## 2014-04-05 MED ORDER — STERILE WATER FOR INJECTION IJ SOLN
INTRAMUSCULAR | Status: AC
  Filled 2014-04-05: qty 10

## 2014-04-05 MED ORDER — PAPAVERINE HCL 30 MG/ML IJ SOLN
INTRAMUSCULAR | Status: AC
  Filled 2014-04-05: qty 2

## 2014-04-05 MED ORDER — SODIUM CHLORIDE 0.9 % IV SOLN
1.0000 g | Freq: Once | INTRAVENOUS | Status: AC
  Administered 2014-04-05: 1 g via INTRAVENOUS
  Filled 2014-04-05: qty 10

## 2014-04-05 MED ORDER — PHENYLEPHRINE HCL 10 MG/ML IJ SOLN
INTRAMUSCULAR | Status: DC | PRN
  Administered 2014-04-05: 40 ug via INTRAVENOUS

## 2014-04-05 MED ORDER — HEPARIN SODIUM (PORCINE) 1000 UNIT/ML IJ SOLN
INTRAMUSCULAR | Status: AC
  Filled 2014-04-05: qty 1

## 2014-04-05 MED ORDER — ONDANSETRON HCL 4 MG/2ML IJ SOLN
INTRAMUSCULAR | Status: AC
  Filled 2014-04-05: qty 2

## 2014-04-05 MED ORDER — SUCCINYLCHOLINE CHLORIDE 20 MG/ML IJ SOLN
INTRAMUSCULAR | Status: AC
  Filled 2014-04-05: qty 1

## 2014-04-05 MED ORDER — VECURONIUM BROMIDE 10 MG IV SOLR
INTRAVENOUS | Status: AC
  Filled 2014-04-05: qty 10

## 2014-04-05 MED ORDER — PROPOFOL 10 MG/ML IV BOLUS
INTRAVENOUS | Status: AC
  Filled 2014-04-05: qty 20

## 2014-04-05 MED ORDER — PROPOFOL INFUSION 10 MG/ML OPTIME
INTRAVENOUS | Status: DC | PRN
  Administered 2014-04-05: 50 ug/kg/min via INTRAVENOUS

## 2014-04-05 MED ORDER — PROTAMINE SULFATE 10 MG/ML IV SOLN
INTRAVENOUS | Status: AC
  Filled 2014-04-05: qty 5

## 2014-04-05 MED ORDER — FENTANYL CITRATE 0.05 MG/ML IJ SOLN
INTRAMUSCULAR | Status: AC
  Filled 2014-04-05: qty 5

## 2014-04-05 MED ORDER — 0.9 % SODIUM CHLORIDE (POUR BTL) OPTIME
TOPICAL | Status: DC | PRN
  Administered 2014-04-05: 1000 mL

## 2014-04-05 MED ORDER — PHENYLEPHRINE 40 MCG/ML (10ML) SYRINGE FOR IV PUSH (FOR BLOOD PRESSURE SUPPORT)
PREFILLED_SYRINGE | INTRAVENOUS | Status: AC
  Filled 2014-04-05: qty 10

## 2014-04-05 MED ORDER — SODIUM CHLORIDE 0.9 % IV SOLN
INTRAVENOUS | Status: DC | PRN
  Administered 2014-04-05: 11:00:00 via INTRAVENOUS

## 2014-04-05 MED ORDER — LIDOCAINE-EPINEPHRINE (PF) 1 %-1:200000 IJ SOLN
INTRAMUSCULAR | Status: DC | PRN
  Administered 2014-04-05: 12 mL via INTRADERMAL

## 2014-04-05 MED ORDER — ONDANSETRON HCL 4 MG/2ML IJ SOLN: 4.0000 mg | Freq: Once | INTRAMUSCULAR | Status: DC | PRN

## 2014-04-05 MED ORDER — PROTAMINE SULFATE 10 MG/ML IV SOLN
INTRAVENOUS | Status: DC | PRN
  Administered 2014-04-05: 40 mg via INTRAVENOUS

## 2014-04-05 MED ORDER — MIDAZOLAM HCL 5 MG/5ML IJ SOLN
INTRAMUSCULAR | Status: DC | PRN
  Administered 2014-04-05 (×2): 1 mg via INTRAVENOUS

## 2014-04-05 MED ORDER — ARTIFICIAL TEARS OP OINT
TOPICAL_OINTMENT | OPHTHALMIC | Status: AC
  Filled 2014-04-05: qty 3.5

## 2014-04-05 MED ORDER — MIDAZOLAM HCL 2 MG/2ML IJ SOLN
INTRAMUSCULAR | Status: AC
  Filled 2014-04-05: qty 2

## 2014-04-05 MED ORDER — ONDANSETRON HCL 4 MG/2ML IJ SOLN
INTRAMUSCULAR | Status: DC | PRN
  Administered 2014-04-05: 4 mg via INTRAVENOUS

## 2014-04-05 MED ORDER — FENTANYL CITRATE 0.05 MG/ML IJ SOLN
INTRAMUSCULAR | Status: DC | PRN
  Administered 2014-04-05 (×2): 50 ug via INTRAVENOUS
  Administered 2014-04-05 (×2): 25 ug via INTRAVENOUS
  Administered 2014-04-05 (×2): 50 ug via INTRAVENOUS

## 2014-04-05 MED ORDER — SODIUM CHLORIDE 0.9 % IV SOLN
INTRAVENOUS | Status: DC
  Administered 2014-04-05: 10:00:00 via INTRAVENOUS

## 2014-04-05 MED ORDER — THROMBIN 20000 UNITS EX SOLR
CUTANEOUS | Status: AC
  Filled 2014-04-05: qty 20000

## 2014-04-05 MED ORDER — OXYCODONE-ACETAMINOPHEN 5-325 MG PO TABS: 1.0000 | ORAL_TABLET | ORAL | Status: DC | PRN

## 2014-04-05 MED ORDER — SODIUM CHLORIDE 0.9 % IR SOLN
Status: DC | PRN
  Administered 2014-04-05: 09:00:00

## 2014-04-05 MED ORDER — ROCURONIUM BROMIDE 50 MG/5ML IV SOLN
INTRAVENOUS | Status: AC
  Filled 2014-04-05: qty 1

## 2014-04-05 MED ORDER — EPHEDRINE SULFATE 50 MG/ML IJ SOLN
INTRAMUSCULAR | Status: AC
Start: 2014-04-05 — End: 2014-04-05
  Filled 2014-04-05: qty 1

## 2014-04-05 MED ORDER — CEFAZOLIN SODIUM-DEXTROSE 2-3 GM-% IV SOLR
2.0000 g | Freq: Once | INTRAVENOUS | Status: AC
  Administered 2014-04-05: 2 g via INTRAVENOUS

## 2014-04-05 MED ORDER — CEFAZOLIN SODIUM-DEXTROSE 2-3 GM-% IV SOLR
INTRAVENOUS | Status: AC
  Filled 2014-04-05: qty 50

## 2014-04-05 MED ORDER — SODIUM CHLORIDE 0.9 % IJ SOLN
INTRAMUSCULAR | Status: AC
  Filled 2014-04-05: qty 10

## 2014-04-05 MED ORDER — WHITE PETROLATUM GEL
Status: AC
Start: 2014-04-05 — End: 2014-04-06
  Filled 2014-04-05: qty 5

## 2014-04-05 MED ORDER — LIDOCAINE HCL (PF) 1 % IJ SOLN
INTRAMUSCULAR | Status: AC
  Filled 2014-04-05: qty 30

## 2014-04-05 MED ORDER — HEPARIN SODIUM (PORCINE) 1000 UNIT/ML IJ SOLN
INTRAMUSCULAR | Status: DC | PRN
  Administered 2014-04-05: 6000 [IU] via INTRAVENOUS

## 2014-04-05 MED ORDER — DEXAMETHASONE SODIUM PHOSPHATE 4 MG/ML IJ SOLN
INTRAMUSCULAR | Status: AC
  Filled 2014-04-05: qty 1

## 2014-04-05 MED ORDER — GLYCOPYRROLATE 0.2 MG/ML IJ SOLN
INTRAMUSCULAR | Status: AC
  Filled 2014-04-05: qty 2

## 2014-04-05 MED ORDER — PROPOFOL 10 MG/ML IV BOLUS
INTRAVENOUS | Status: DC | PRN
  Administered 2014-04-05: 30 mg via INTRAVENOUS

## 2014-04-05 MED ORDER — HEPARIN SODIUM (PORCINE) 1000 UNIT/ML IJ SOLN
INTRAMUSCULAR | Status: DC | PRN
  Administered 2014-04-05: 4.6 mL

## 2014-04-05 SURGICAL SUPPLY — 62 items
ARMBAND PINK RESTRICT EXTREMIT (MISCELLANEOUS) ×4 IMPLANT
BAG DECANTER FOR FLEXI CONT (MISCELLANEOUS) ×8 IMPLANT
BLADE 10 SAFETY STRL DISP (BLADE) IMPLANT
CANISTER SUCTION 2500CC (MISCELLANEOUS) ×4 IMPLANT
CATH CANNON HEMO 15F 50CM (CATHETERS) IMPLANT
CATH CANNON HEMO 15FR 19 (HEMODIALYSIS SUPPLIES) IMPLANT
CATH CANNON HEMO 15FR 23CM (HEMODIALYSIS SUPPLIES) ×4 IMPLANT
CATH CANNON HEMO 15FR 31CM (HEMODIALYSIS SUPPLIES) IMPLANT
CATH CANNON HEMO 15FR 32CM (HEMODIALYSIS SUPPLIES) IMPLANT
CHLORAPREP W/TINT 26ML (MISCELLANEOUS) ×4 IMPLANT
CLIP TI MEDIUM 6 (CLIP) ×4 IMPLANT
CLIP TI WIDE RED SMALL 6 (CLIP) ×8 IMPLANT
COVER PROBE W GEL 5X96 (DRAPES) ×4 IMPLANT
COVER SURGICAL LIGHT HANDLE (MISCELLANEOUS) ×8 IMPLANT
DECANTER SPIKE VIAL GLASS SM (MISCELLANEOUS) IMPLANT
DERMABOND ADVANCED (GAUZE/BANDAGES/DRESSINGS) ×2
DERMABOND ADVANCED .7 DNX12 (GAUZE/BANDAGES/DRESSINGS) ×2 IMPLANT
DRAIN PENROSE 1/2X12 LTX STRL (WOUND CARE) IMPLANT
DRAPE C-ARM 42X72 X-RAY (DRAPES) ×4 IMPLANT
DRAPE CHEST BREAST 15X10 FENES (DRAPES) ×4 IMPLANT
ELECT REM PT RETURN 9FT ADLT (ELECTROSURGICAL) ×4
ELECTRODE REM PT RTRN 9FT ADLT (ELECTROSURGICAL) ×2 IMPLANT
GAUZE SPONGE 2X2 8PLY STRL LF (GAUZE/BANDAGES/DRESSINGS) ×2 IMPLANT
GAUZE SPONGE 4X4 16PLY XRAY LF (GAUZE/BANDAGES/DRESSINGS) ×4 IMPLANT
GLOVE BIO SURGEON STRL SZ7.5 (GLOVE) ×8 IMPLANT
GLOVE BIOGEL PI IND STRL 6.5 (GLOVE) ×4 IMPLANT
GLOVE BIOGEL PI IND STRL 7.0 (GLOVE) ×10 IMPLANT
GLOVE BIOGEL PI IND STRL 8 (GLOVE) ×4 IMPLANT
GLOVE BIOGEL PI INDICATOR 6.5 (GLOVE) ×4
GLOVE BIOGEL PI INDICATOR 7.0 (GLOVE) ×10
GLOVE BIOGEL PI INDICATOR 8 (GLOVE) ×4
GLOVE SURG SS PI 6.5 STRL IVOR (GLOVE) ×4 IMPLANT
GOWN STRL REUS W/ TWL LRG LVL3 (GOWN DISPOSABLE) ×12 IMPLANT
GOWN STRL REUS W/ TWL XL LVL3 (GOWN DISPOSABLE) ×4 IMPLANT
GOWN STRL REUS W/TWL LRG LVL3 (GOWN DISPOSABLE) ×12
GOWN STRL REUS W/TWL XL LVL3 (GOWN DISPOSABLE) ×4
KIT BASIN OR (CUSTOM PROCEDURE TRAY) ×4 IMPLANT
KIT ROOM TURNOVER OR (KITS) ×4 IMPLANT
NEEDLE 18GX1X1/2 (RX/OR ONLY) (NEEDLE) ×4 IMPLANT
NEEDLE 22X1 1/2 (OR ONLY) (NEEDLE) ×4 IMPLANT
NEEDLE HYPO 25GX1X1/2 BEV (NEEDLE) ×4 IMPLANT
NS IRRIG 1000ML POUR BTL (IV SOLUTION) ×4 IMPLANT
PACK CV ACCESS (CUSTOM PROCEDURE TRAY) ×4 IMPLANT
PACK SURGICAL SETUP 50X90 (CUSTOM PROCEDURE TRAY) IMPLANT
PAD ARMBOARD 7.5X6 YLW CONV (MISCELLANEOUS) ×8 IMPLANT
SPONGE GAUZE 2X2 STER 10/PKG (GAUZE/BANDAGES/DRESSINGS) ×2
SPONGE GAUZE 4X4 12PLY STER LF (GAUZE/BANDAGES/DRESSINGS) ×4 IMPLANT
SPONGE SURGIFOAM ABS GEL 100 (HEMOSTASIS) IMPLANT
SUT ETHILON 3 0 PS 1 (SUTURE) ×4 IMPLANT
SUT PROLENE 6 0 BV (SUTURE) ×4 IMPLANT
SUT VIC AB 3-0 SH 27 (SUTURE) ×2
SUT VIC AB 3-0 SH 27X BRD (SUTURE) ×2 IMPLANT
SUT VICRYL 4-0 PS2 18IN ABS (SUTURE) ×8 IMPLANT
SYR 20CC LL (SYRINGE) ×4 IMPLANT
SYR 5ML LL (SYRINGE) ×8 IMPLANT
SYR CONTROL 10ML LL (SYRINGE) ×4 IMPLANT
SYRINGE 10CC LL (SYRINGE) ×4 IMPLANT
TAPE CLOTH SURG 4X10 WHT LF (GAUZE/BANDAGES/DRESSINGS) ×4 IMPLANT
TOWEL OR 17X24 6PK STRL BLUE (TOWEL DISPOSABLE) ×4 IMPLANT
TOWEL OR 17X26 10 PK STRL BLUE (TOWEL DISPOSABLE) ×4 IMPLANT
UNDERPAD 30X30 INCONTINENT (UNDERPADS AND DIAPERS) ×4 IMPLANT
WATER STERILE IRR 1000ML POUR (IV SOLUTION) IMPLANT

## 2014-04-05 NOTE — Anesthesia Procedure Notes (Signed)
Procedure Name: MAC Date/Time: 04/05/2014 10:57 AM Performed by: Jacquiline Doe A Pre-anesthesia Checklist: Patient identified, Timeout performed, Emergency Drugs available, Suction available and Patient being monitored Patient Re-evaluated:Patient Re-evaluated prior to inductionOxygen Delivery Method: Simple face mask Preoxygenation: Pre-oxygenation with 100% oxygen Intubation Type: IV induction Placement Confirmation: positive ETCO2 Dental Injury: Teeth and Oropharynx as per pre-operative assessment

## 2014-04-05 NOTE — Progress Notes (Signed)
Dr. Tana Coast returned page, no new orders at this time.  Pt NPO for HD and fistula placement.  Bath given CHB

## 2014-04-05 NOTE — Anesthesia Preprocedure Evaluation (Signed)
Anesthesia Evaluation  Patient identified by MRN, date of birth, ID band Patient awake    Reviewed: Allergy & Precautions, H&P , NPO status , Patient's Chart, lab work & pertinent test results  Airway       Dental   Pulmonary former smoker,          Cardiovascular hypertension,     Neuro/Psych    GI/Hepatic   Endo/Other    Renal/GU ESRF, Dialysis and CRFRenal disease     Musculoskeletal   Abdominal   Peds  Hematology  (+) anemia ,   Anesthesia Other Findings   Reproductive/Obstetrics                           Anesthesia Physical Anesthesia Plan  ASA: III  Anesthesia Plan: MAC   Post-op Pain Management:    Induction: Intravenous  Airway Management Planned: Mask and LMA  Additional Equipment:   Intra-op Plan:   Post-operative Plan:   Informed Consent: I have reviewed the patients History and Physical, chart, labs and discussed the procedure including the risks, benefits and alternatives for the proposed anesthesia with the patient or authorized representative who has indicated his/her understanding and acceptance.     Plan Discussed with:   Anesthesia Plan Comments:         Anesthesia Quick Evaluation

## 2014-04-05 NOTE — Progress Notes (Signed)
CRITICAL VALUE ALERT  Critical value received:  Calcium 6.1  Date of notification:  04/05/14  Time of notification:  0718  Critical value read back: yes  Nurse who received alert:  Kizzie Ide  MD notified (1st page):  Dr. Tana Coast   Time of first page:  0718  MD notified (2nd page)none  Time of second page:  Responding MD:  Dr. Tana Coast  Time MD responded:  [phone]

## 2014-04-05 NOTE — H&P (View-Only) (Signed)
[facility] NOTE   MRN : [id number]  Reason for Consult: Dialysis access Referring Physician: Lucrezia Starch, MD  History of Present Illness: 53 y/o male with chronic kidney disease.  He has not been followed by nephrology because he has chosen no to.  He has polycystic kidney disease and has a family history of it both his father and sister.  He was brought to the ER on 04/02/2014 with complaints of shortness of breath, nausea, vomiting and diarrhea.   He has a temporary right IJ catheter and multiple IV's in the right arm.  He is right hand dominant.  His creatinine is 13.88 down from admitting values of 25.  He states he has no other medical conditions and takes no medications.  He denise DM. HTN, and hyperlipidemia.   Current Facility-Administered Medications  Medication Dose Route Frequency Provider Last Rate Last Dose  . 0.9 %  sodium chloride infusion  250 mL Intravenous PRN Donita Brooks, NP      . calcium acetate (PHOSLO) capsule 1,334 mg  1,334 mg Oral TID WC Lucrezia Starch, MD   1,334 mg at 04/03/14 1111  . [START ON 04/04/2014] darbepoetin (ARANESP) injection 200 mcg  200 mcg Intravenous Q Wed-HD Lucrezia Starch, MD      . feeding supplement (ENSURE COMPLETE) (ENSURE COMPLETE) liquid 237 mL  237 mL Oral BID BM Dalene Carrow, RD      . heparin injection 5,000 Units  5,000 Units Subcutaneous 3 times per day Donita Brooks, NP   5,000 Units at 04/03/14 1311  . metoprolol tartrate (LOPRESSOR) tablet 25 mg  25 mg Oral BID Lucrezia Starch, MD   25 mg at 04/03/14 0912  . ondansetron (ZOFRAN) injection 4 mg  4 mg Intravenous Q6H PRN Donita Brooks, NP      . potassium chloride SA (K-DUR,KLOR-CON) CR tablet 40 mEq  40 mEq Oral BID WC Lucrezia Starch, MD   40 mEq at 04/03/14 0910   Pt meds include: Statin :No Betablocker: No ASA: No Other anticoagulants/antiplatelets:   Past Medical History  Diagnosis Date  . Chronic kidney  disease   . Polycystic kidney disease   . Leucocytosis   . Hypertension   . Anemia   . Shortness of breath    Past Surgical History  Procedure Laterality Date  . Nose surgery    . Central venous catheter insertion  04/02/2014    DR Titus Mould   Social History History  Substance Use Topics  . Smoking status: Former Research scientist (life sciences)  . Smokeless tobacco: Never Used     Comment: QUIT SMOKING CIGARETTES  IN 1994      QUIT SMOKING POT   . Alcohol Use: No   Family History Family History  Problem Relation Age of Onset  . Polycystic kidney disease Father   . Polycystic kidney disease Sister    No Known Allergies  REVIEW OF SYSTEMS  General: [ ]  Weight loss, [ ]  Fever, [ ]  chills Neurologic: [ ]  Dizziness, [ ]  Blackouts, [ ]  Seizure [ ]  Stroke, [ ]  "Mini stroke", [ ]  Slurred speech, [ ]  Temporary blindness; [ ]  weakness in arms or legs, [ ]  Hoarseness [ ]  Dysphagia Cardiac: [ ]  Chest pain/pressure, [x ] Shortness of breath at rest [x ] Shortness of breath with exertion, [ ]  Atrial fibrillation or irregular heartbeat  Vascular: [ ]  Pain in legs with walking, [ ]  Pain in legs at  rest, [ ]  Pain in legs at night,  [ ]  Non-healing ulcer, [ ]  Blood clot in vein/DVT,   Pulmonary: [ ]  Home oxygen, [ ]  Productive cough, [ ]  Coughing up blood, [ ]  Asthma,  [ ]  Wheezing [ ]  COPD Musculoskeletal:  [ ]  Arthritis, [ ]  Low back pain, [ ]  Joint pain Hematologic: [x ] Easy Bruising, [ ]  Anemia; [ ]  Hepatitis Gastrointestinal: [ ]  Blood in stool, [ ]  Gastroesophageal Reflux/heartburn, Urinary: [x ] chronic Kidney disease, [ ]  on HD - [ ]  MWF or [ ]  TTHS, [ ]  Burning with urination, [ ]  Difficulty urinating Skin: [x ] Rashes, [ ]  Wounds [x]  multiple small scabs, sores on his arms. Psychological: [ ]  Anxiety, [ ]  Depression  Physical Examination Filed Vitals:   04/03/14 1200 04/03/14 1300 04/03/14 1400 04/03/14 1500  BP: 133/88 132/92 135/88 119/68  Pulse:  88    Temp: 97.5 F (36.4 C)     TempSrc: Oral      Resp:    15  Height:      Weight:      SpO2:  100% 100% 100%   Body mass index is 24.15 kg/(m^2).  General:  WDWN in NAD Gait: Normal HENT: WNL Eyes: Pupils equal Pulmonary: normal labored breathing with pursed lips, without Rales, rhonchi,  wheezing Cardiac: RRR, without  Murmurs, rubs or gallops; No carotid bruits Abdomen: distended, NT, no masses Skin: no rashes, ulcers noted;  no Gangrene , no cellulitis; no open wounds;   Vascular Exam/Pulses:Palpable left radial and brachial pulses, left radial pulse palpable.  Palpable PT, and femoral pulses.    Musculoskeletal: no muscle wasting or atrophy; no edema  Neurologic: A&O X 3; Appropriate Affect ;  SENSATION: normal; MOTOR FUNCTION: 5/5 Symmetric Speech is fluent/normal  Significant Diagnostic Studies: CBC Lab Results  Component Value Date   WBC 9.3 04/03/2014   HGB 7.7* 04/03/2014   HCT 21.6* 04/03/2014   MCV 80.3 04/03/2014   PLT 242 04/03/2014    BMET    Component Value Date/Time   NA 140 04/03/2014 0500   K 2.8* 04/03/2014 0500   CL 97 04/03/2014 0500   CO2 17* 04/03/2014 0500   GLUCOSE 146* 04/03/2014 0500   BUN 133* 04/03/2014 0500   CREATININE 13.88* 04/03/2014 0500   CALCIUM 5.0* 04/03/2014 0500   GFRNONAA 4* 04/03/2014 0500   GFRAA 4* 04/03/2014 0500   Estimated Creatinine Clearance: 6.3 ml/min (by C-G formula based on Cr of 13.88).  COAG Lab Results  Component Value Date   INR 1.51* 04/02/2014   Non-Invasive Vascular Imaging:  Pending vein mapping  ASSESSMENT:  CKD (no treatment to date) his sister is on dialysis. Metabolic Acidosis Anemia secondary to CKD Hypertension currently being treated Lopressor  PLAN: Diatek placement and fistula verses graft pending results of the vein mapping.    Laurence Slate [facility] 04/03/2014 3:50 PM  Agree with above. Vein map is pending. Tentatively plan for a left AV fistula and tunneled dialysis catheter on Thursday.  Deitra Mayo, MD, [place]  [phone] 04/03/2014

## 2014-04-05 NOTE — Progress Notes (Signed)
Critical calcium 6.1 up from 5.0 yesterday.  Paged Dr. Larinda Buttery

## 2014-04-05 NOTE — OR Nursing (Signed)
End time for first procedure, insertion of dialysis catheter, was 1140.

## 2014-04-05 NOTE — Transfer of Care (Signed)
Immediate Anesthesia Transfer of Care Note  Patient: [name] [name]  Procedure(s) Performed: Procedure(s): CREATION OF ARTERIOVENOUS (AV) FISTULA  (Left) ULTRASOUND GUIDED INSERTION OF DIALYSIS CATHETER (Right)  Patient Location: PACU  Anesthesia Type:MAC  Level of Consciousness: awake, oriented, patient cooperative and responds to stimulation  Airway & Oxygen Therapy: Patient Spontanous Breathing and Patient connected to face mask oxygen  Post-op Assessment: Report given to PACU RN, Post -op Vital signs reviewed and stable, Patient moving all extremities and Patient moving all extremities X 4  Post vital signs: Reviewed and stable  Complications: No apparent anesthesia complications

## 2014-04-05 NOTE — Interval H&P Note (Signed)
History and Physical Interval Note:  04/05/2014 10:47 AM  [name] [name]  has presented today for surgery, with the diagnosis of End stage renal disease  The various methods of treatment have been discussed with the patient and family. After consideration of risks, benefits and other options for treatment, the patient has consented to  Procedure(s): ARTERIOVENOUS (AV) FISTULA CREATION (Left) INSERTION OF DIALYSIS CATHETER (N/A) as a surgical intervention .  The patient's history has been reviewed, patient examined, no change in status, stable for surgery.  I have reviewed the patient's chart and labs.  Questions were answered to the patient's satisfaction.     DICKSON,CHRISTOPHER S

## 2014-04-05 NOTE — Progress Notes (Signed)
Pt back from surgery.  A&OX4. VSS. Denies pain. Pleasant and hungry called service response for lunch tray.

## 2014-04-05 NOTE — Op Note (Signed)
    NAME: [name] [name]    MRN: [id number] DOB: [birth year]    DATE OF OPERATION: 04/05/2014  PREOP DIAGNOSIS: Chronic kidney disease  POSTOP DIAGNOSIS: Same  PROCEDURE:  1. Ultrasound-guided placement of right IJ 23 cm tunnel dialysis catheter 2. Left radial cephalic AV fistula  SURGEON: Judeth Cornfield. Scot Dock, MD, FACS  ASSIST: Silva Bandy, [facility]  ANESTHESIA: local with sedation   EBL: minimal  INDICATIONS: [name] [name] is a 53 y.o. male who has just begun dialysis. I was asked to exchange his temporary catheter for a permanent catheter and also place long-term access. His preoperative vein mapping showed that his veins were all quite small.  FINDINGS: Patent right IJ. 2 mm radial artery. 3 mm forearm cephalic vein.  TECHNIQUE: The patient was taken to the room and sedated by anesthesia. The temporary catheter in the right IJ was very high in the neck and therefore I could not exchange this over a wire. I therefore removed his catheter and held pressure for hemostasis. The neck and upper chest were then prepped and draped in the usual sterile fashion. Under ultrasound guidance, after the skin was anesthetized, the right IJ was cannulated and a guidewire introduced into the superior vena cava under fluoroscopic control. The tract over the wire was dilated and the dilator and peel-away sheath were advanced over the wire. The catheter was advanced to the peel-away sheath and positioned in the right atrium. The exit site catheter was selected and the skin anesthetized between the 2 areas. The catheter was then brought through the tunnel, cut to the appropriate length, and the distal ports were attached. Both ports withdrew easily was then flushed with heparinized saline and filled with concentrated heparin. The catheter was secured at its exit site with a 3-0 nylon suture. The IJ cannulation site was closed with 4-0 subcuticular stitch. A sterile dressing was applied.  Attention was then turned to the  left arm. The left upper extremity was prepped and draped in the usual sterile fashion. The cephalic vein was fairly far lateral and therefore made a separate incision over this vein and the skin was anesthetized. The vein was dissected free and ligated distally. It took a 3 mm dilator. It was a small vein harvested the upper arm cephalic vein did not look much better and therefore elected to proceed with radiocephalic AV fistula. After the skin was anesthetized with 1% lidocaine, a separate longitudinal incision was made over the radial artery. The radial artery was dissected free beneath the fascia. The patient was then heparinized. The radial artery was clamped proximally and distally and a longitudinal arteriotomy was made. The vein was spatulated and sewn end-to-side to the artery using continuous 6-0 Prolene suture. At the completion was a palpable thrill in the fistula and a good radial signal distal to the anastomosis. Hemostasis was obtained in the wound. The heparin was partially reversed with protamine. The wounds closed the deep layer 3-0 Vicryl and the skin closed with 4-0 Vicryl. Dermabond was applied. The patient tolerated the procedure well was transferred to the recovery room in stable condition. All needle and sponge counts were correct.  Deitra Mayo, MD, FACS [facility]  DATE OF DICTATION:   04/05/2014

## 2014-04-05 NOTE — Progress Notes (Signed)
Patient ID: [name] [name]  male  [id number]    DOB: 10-03-1961    DOA: 04/02/2014  PCP: No PCP Per Patient  Patient is a 53 year old male with polycystic kidney disease admitted on 6/15 to critical care service with profound metabolic acidosis and acute renal failure.  Assessment/Plan: Principal Problem:   Acute renal failure, new ESRD, history of polycystic kidney disease - Creatinine 25.35 with BUN of 202 at the time of admission, nephrology was consulted and CRRT started, so far tolerating it - Vein mapping done  - vascular surgery consulted, left AV fistula and tunneled dialysis catheter placed today  Active Problems:   Polycystic kidney disease - Nephrology consulted, CLIP to be started    Metabolic acidosis with hypokalemia, hypomagnesemia, hypocalcemia - Likely due to #1, - Calcium gluconate x1 IV, continue oral phoslo - K-Dur x1    Essential hypertension, benign - Improved    Acute encephalopathy - Resolved   Leukocytosis : Resolved    DVT Prophylaxis:  Code Status:  Family Communication: Alert and oriented, discussed with patient   Disposition:  Consultants:  Nephrology    vascular surgery  Procedures:  CRRT  Antibiotics:  none  Subjective: Patient seen and examined, denies any new complaints   Objective: Weight change: 0.1 kg (3.5 oz)  Intake/Output Summary (Last 24 hours) at 04/05/14 1405 Last data filed at 04/05/14 1345  Gross per 24 hour  Intake    880 ml  Output   1567 ml  Net   -687 ml   Blood pressure 111/77, pulse 76, temperature 97.9 F (36.6 C), temperature source Oral, resp. rate 14, height 5\' 9"  (1.753 m), weight 73.8 kg (162 lb 11.2 oz), SpO2 96.00%.  Physical Exam: General: Alert and awake, oriented x3, not in any acute distress. CVS: S1-S2 clear, no murmur rubs or gallops Chest: clear to auscultation bilaterally, no wheezing, rales or rhonchi Abdomen: soft nontender, nondistended, normal bowel sounds  Extremities: no  cyanosis, clubbing or edema noted bilaterally Neuro: Cranial nerves II-XII intact, no focal neurological deficits   Lab Results: Basic Metabolic Panel:  Recent Labs Lab 04/04/14 1333 04/05/14 0536  NA 140 144  K 3.4* 3.4*  CL 100 101  CO2 16* 23  GLUCOSE 187* 121*  BUN 129* 62*  CREATININE 13.29* 7.64*  CALCIUM 5.0* 6.1*  MG 2.2  --   PHOS  --  2.5   Liver Function Tests:  Recent Labs Lab 04/04/14 1333 04/05/14 0536  AST 28  --   ALT 19  --   ALKPHOS 119*  --   BILITOT 0.5  --   PROT 6.0  --   ALBUMIN 3.0* 3.0*   No results found for this basename: LIPASE, AMYLASE,  in the last 168 hours No results found for this basename: AMMONIA,  in the last 168 hours CBC:  Recent Labs Lab 04/02/14 1228 04/03/14 0500 04/04/14 1333  WBC 16.0* 9.3 9.6  NEUTROABS 14.9*  --   --   HGB 9.7* 7.7* 7.3*  HCT 27.6* 21.6* 20.8*  MCV 83.9 80.3 84.9  PLT 341 242 203   Cardiac Enzymes: No results found for this basename: CKTOTAL, CKMB, CKMBINDEX, TROPONINI,  in the last 168 hours BNP: No components found with this basename: POCBNP,  CBG:  Recent Labs Lab 04/02/14 1645  GLUCAP 132*     Micro Results: Recent Results (from the past 240 hour(s))  CULTURE, BLOOD (ROUTINE X 2)     Status: None   Collection Time  04/02/14 12:28 PM      Result Value Ref Range Status   Specimen Description BLOOD RIGHT ARM   Final   Special Requests BOTTLES DRAWN AEROBIC AND ANAEROBIC 10CC   Final   Culture  Setup Time     Final   Value: 04/02/2014 22:51     Performed at [facility]   Culture     Final   Value:        BLOOD CULTURE RECEIVED NO GROWTH TO DATE CULTURE WILL BE HELD FOR 5 DAYS BEFORE ISSUING A FINAL NEGATIVE REPORT     Performed at [facility]   Report Status PENDING   Incomplete  CULTURE, BLOOD (ROUTINE X 2)     Status: None   Collection Time    04/02/14  3:57 PM      Result Value Ref Range Status   Specimen Description BLOOD LEFT ARM   Final   Special  Requests BOTTLES DRAWN AEROBIC AND ANAEROBIC 5CC   Final   Culture  Setup Time     Final   Value: 04/02/2014 22:51     Performed at [facility]   Culture     Final   Value:        BLOOD CULTURE RECEIVED NO GROWTH TO DATE CULTURE WILL BE HELD FOR 5 DAYS BEFORE ISSUING A FINAL NEGATIVE REPORT     Performed at [facility]   Report Status PENDING   Incomplete  URINE CULTURE     Status: None   Collection Time    04/02/14  4:50 PM      Result Value Ref Range Status   Specimen Description URINE, CATHETERIZED   Final   Special Requests NONE   Final   Culture  Setup Time     Final   Value: 04/03/2014 00:19     Performed at [place]     Final   Value: NO GROWTH     Performed at [facility]   Culture     Final   Value: NO GROWTH     Performed at [facility]   Report Status 04/04/2014 FINAL   Final  MRSA PCR SCREENING     Status: None   Collection Time    04/02/14  4:52 PM      Result Value Ref Range Status   MRSA by PCR NEGATIVE  NEGATIVE Final   Comment:            The GeneXpert MRSA Assay (FDA     approved for NASAL specimens     only), is one component of a     comprehensive MRSA colonization     surveillance program. It is not     intended to diagnose MRSA     infection nor to guide or     monitor treatment for     MRSA infections.    Studies/Results: US Abdomen Complete  04/03/2014   CLINICAL DATA:  Polycystic kidney disease.  Polycystic liver.  EXAM: ULTRASOUND ABDOMEN COMPLETE  COMPARISON:  None.  FINDINGS: Gallbladder:  Suboptimally visualized due to renal cysts. No stones. Wall thickness 2 mm. No sonographic Murphy sign.  Common bile duct:  Diameter: 5 mm, normal for age.  Liver:  Multiple cysts.  The largest is 18 mm.  No solid mass lesions.  IVC:  No abnormality visualized.  Pancreas:  Visualized portion unremarkable.  Spleen:  6.6 cm.  Normal echotexture.  Right  Kidney:  Length: 24.9 cm.  Polycystic Kidney.   Largest cyst measures 54 mm.  Left Kidney:  Length: 24.2 cm.  Polycystic kidney.  Largest cyst measures 64 mm  Abdominal aorta:  Poorly visualized due to enlarged kidneys.  Other findings:  None.  IMPRESSION: Polycystic liver and kidneys.  No acute abnormality.  No gallstones.   Electronically Signed   By: Dereck Ligas M.D.   On: 04/03/2014 00:26   Dg Chest Portable 1 View  04/02/2014   CLINICAL DATA:  Central catheter placed  EXAM: PORTABLE CHEST - 1 VIEW  COMPARISON:  Study obtained earlier in the day  FINDINGS: Central catheter tip is in the superior vena cava. No pneumothorax. There is no edema or consolidation. Heart size and pulmonary vascularity are normal. No adenopathy. No bone lesions.  IMPRESSION: Central catheter tip in superior vena cava. No pneumothorax. Lungs clear.   Electronically Signed   By: Lowella Grip M.D.   On: 04/02/2014 15:58   Dg Chest Port 1 View  04/02/2014   CLINICAL DATA:  Chronic renal insufficiency and polycystic kidney disease  EXAM: PORTABLE CHEST - 1 VIEW  COMPARISON:  None.  FINDINGS: The lungs are mildly hypoinflated which accentuates the perihilar interstitial markings.The cardiac silhouette is top-normal in size. The pulmonary vascularity is not clearly engorged. The observed bony structures are unremarkable.  IMPRESSION: Mildly increased perihilar lung markings may be due to vascular crowding or could reflect subsegmental atelectasis. Otherwise there is no acute cardiopulmonary abnormality.   Electronically Signed   By: David  Martinique   On: 04/02/2014 13:07    Medications: Scheduled Meds: . calcium acetate  1,334 mg Oral TID WC  . calcium gluconate  1 g Intravenous Once  . ceFAZolin      .  ceFAZolin (ANCEF) IV  1 g Intravenous On Call  . darbepoetin (ARANESP) injection - DIALYSIS  200 mcg Intravenous Q Wed-HD  . doxercalciferol  2 mcg Intravenous Q M,W,F-HD  . feeding supplement (ENSURE COMPLETE)  237 mL Oral BID BM  . heparin  5,000 Units  Subcutaneous 3 times per day  . metoprolol tartrate  25 mg Oral BID      LOS: 3 days   RAI,RIPUDEEP M.D. [facility] 04/05/2014, 2:05 PM Pager: [phone]  If 7PM-7AM, please contact night-coverage [email or web address] Password [email or web address]  **Disclaimer: This note was dictated with voice recognition software. Similar sounding words can inadvertently be transcribed and this note may contain transcription errors which may not have been corrected upon publication of note.**

## 2014-04-05 NOTE — Progress Notes (Signed)
NUTRITION FOLLOW-UP/CONSULT  DOCUMENTATION CODES Per approved criteria  -Not Applicable   INTERVENTION: Continue Ensure Complete PO BID, each supplement provides 350 kcal and 13 grams of protein. RD provided diet education during this visit. RD to continue to follow-up for oral intake adequacy and need for further education.  NUTRITION DIAGNOSIS: Inadequate oral intake related to poor appetite as evidenced by weight loss PTA.   Goal: Intake to meet >90% of estimated nutrition needs.  Monitor:  PO intake, labs, weight trends, supplement tolerance  ASSESSMENT: 53 yo with polycystic kidney disease admitted 6/15 with profound metabolic acidosis and acute renal failure.   Currently ordered for Renal Diet with 1200 ml fluid restriction. Pt is eating 50% of meals. Pt was in OR today for L AV fistula and tunneled catheter placement. RD received consult for diet education. Pt was somewhat sleepy during RD visit, however we reviewed Choose A Meal Booklet recommendations.   Pt reports that his appetite is much improved. Ate all of his sandwich that he received after his OR trip.  Potassium low at 3.4 Phosphorus WNL Magnesium WNL  Height: Ht Readings from Last 1 Encounters:  04/02/14 5\' 9"  (1.753 m)    Weight: Wt Readings from Last 1 Encounters:  04/04/14 162 lb 11.2 oz (73.8 kg)  Admit wt 165 lb  BMI:  Body mass index is 24.02 kg/(m^2). WNL  Estimated Nutritional Needs: Kcal: 2200-2400 Protein: 111-148 gm Fluid: 1.2 L  Skin:  R neck incision Arm L incision  Diet Order: Renal with 1200 ml fluid restriction    Intake/Output Summary (Last 24 hours) at 04/05/14 1440 Last data filed at 04/05/14 1345  Gross per 24 hour  Intake    880 ml  Output   1567 ml  Net   -687 ml    Last BM: 6/17  Labs:   Recent Labs Lab 04/02/14 1557  04/03/14 0500 04/03/14 1540 04/03/14 2304 04/04/14 1333 04/05/14 0536  NA 135*  < > 140 139  --  140 144  K 3.9  < > 2.8* 2.9* 3.3*  3.4* 3.4*  CL 92*  < > 97 97  --  100 101  CO2 <7*  < > 17* 19  --  16* 23  BUN 201*  < > 133* 108*  --  129* 62*  CREATININE 24.55*  < > 13.88* 11.37*  --  13.29* 7.64*  CALCIUM 4.0*  < > 5.0* 5.5*  --  5.0* 6.1*  MG  --   --  1.5  --   --  2.2  --   PHOS 13.1*  --  4.0 2.8  --   --  2.5  GLUCOSE 139*  < > 146* 194*  --  187* 121*  < > = values in this interval not displayed.  CBG (last 3)   Recent Labs  04/02/14 1645  GLUCAP 132*    Scheduled Meds: . calcium acetate  1,334 mg Oral TID WC  . calcium gluconate  1 g Intravenous Once  . ceFAZolin      .  ceFAZolin (ANCEF) IV  1 g Intravenous On Call  . darbepoetin (ARANESP) injection - DIALYSIS  200 mcg Intravenous Q Wed-HD  . doxercalciferol  2 mcg Intravenous Q M,W,F-HD  . feeding supplement (ENSURE COMPLETE)  237 mL Oral BID BM  . heparin  5,000 Units Subcutaneous 3 times per day  . metoprolol tartrate  25 mg Oral BID  . potassium chloride  40 mEq Oral Once  Continuous Infusions: . sodium chloride Stopped (04/05/14 1300)    Inda Coke MS, RD, LDN Inpatient Registered Dietitian Pager: [phone] After-hours pager: [phone]

## 2014-04-05 NOTE — Progress Notes (Signed)
Subjective:  Sitting on side of bed, eating, s/p access placement, no complaints, breathing well  Objective: Vital signs in last 24 hours: Temp:  [97.7 F (36.5 C)-99.1 F (37.3 C)] 97.9 F (36.6 C) (06/18 1345) Pulse Rate:  [76-94] 76 (06/18 1330) Resp:  [10-18] 14 (06/18 1345) BP: (93-135)/(63-86) 111/77 mmHg (06/18 1345) SpO2:  [95 %-100 %] 96 % (06/18 1330) Weight:  [73.5 kg (162 lb 0.6 oz)-73.8 kg (162 lb 11.2 oz)] 73.8 kg (162 lb 11.2 oz) (06/17 2134) Weight change: 0.1 kg (3.5 oz)  Intake/Output from previous day: 06/17 0701 - 06/18 0700 In: 720 [P.O.:720] Out: 1507 [Urine:600] Intake/Output this shift: Total I/O In: 400 [I.V.:400] Out: 210 [Urine:200; Blood:10]  EXAM: General appearance:  Alert, in no apparent distress Resp:  CTA without rales, rhonchi, or wheezes Cardio:  RRR without murmur or rub GI:  + BS, soft and nontender Extremities:  No edema Access:  R IJ catheter, AVF @ LFA with + bruit (both placed today)  Lab Results:  Recent Labs  04/03/14 0500 04/04/14 1333  WBC 9.3 9.6  HGB 7.7* 7.3*  HCT 21.6* 20.8*  PLT 242 203   BMET:  Recent Labs  04/04/14 1333 04/05/14 0536  NA 140 144  K 3.4* 3.4*  CL 100 101  CO2 16* 23  GLUCOSE 187* 121*  BUN 129* 62*  CREATININE 13.29* 7.64*  CALCIUM 5.0* 6.1*  ALBUMIN 3.0* 3.0*    Recent Labs  04/02/14 1557  PTH 1120.6*   Iron Studies:  Recent Labs  04/02/14 1557  IRON 178*  TIBC NOT CALC  FERRITIN 383*    Studies/Results: No results found.  Assessment/Plan: 1. 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 Scot Dock today. CLIP process started for NW. 2. Metabolic acidosis - resolved (CO2 23) s/p NaCHO3 drip & CRRT (both dc'd 6/16). 3. Hypocalcemia - hyperphosphatemia corrected by  CRRT, corrected Ca up to 6.9, Ca gluconate today, now on Ca-based binders (Phoslo). 4. Sec hyperparathyroidism - iPTH 1123, Hectorol 2 mcg per HD started. 5. Anemia - Hgb 7.3, started Aranesp 200 mcg, Fe stores adequate. 6. Skin [place] - unclear etiology. 7. HTN - BP stable on Lopressor 25 mg bid. 8. Leukocytosis - resolved   LOS: 3 days   LYLES,CHARLES 04/05/2014,2:49 PM Agree with findings and plans in note by Ramiro Harvest [facility]

## 2014-04-06 ENCOUNTER — Telehealth: Payer: Self-pay | Admitting: Vascular Surgery

## 2014-04-06 LAB — RENAL FUNCTION PANEL
Albumin: 2.8 g/dL — ABNORMAL LOW (ref 3.5–5.2)
BUN: 97 mg/dL — ABNORMAL HIGH (ref 6–23)
CO2: 18 mEq/L — ABNORMAL LOW (ref 19–32)
Calcium: 6.1 mg/dL — CL (ref 8.4–10.5)
Chloride: 97 mEq/L (ref 96–112)
Creatinine, Ser: 10.73 mg/dL — ABNORMAL HIGH (ref 0.50–1.35)
GFR calc Af Amer: 6 mL/min — ABNORMAL LOW (ref >90)
GFR calc non Af Amer: 5 mL/min — ABNORMAL LOW (ref >90)
GLUCOSE: 142 mg/dL — AB (ref 70–99)
PHOSPHORUS: 3.6 mg/dL (ref 2.3–4.6)
POTASSIUM: 3.6 meq/L — AB (ref 3.7–5.3)
Sodium: 138 mEq/L (ref 137–147)

## 2014-04-06 LAB — CBC
HEMATOCRIT: 20.7 % — AB (ref 39.0–52.0)
HEMOGLOBIN: 6.7 g/dL — AB (ref 13.0–17.0)
MCH: 28.5 pg (ref 26.0–34.0)
MCHC: 32.4 g/dL (ref 30.0–36.0)
MCV: 88.1 fL (ref 78.0–100.0)
Platelets: 180 10*3/uL (ref 150–400)
RBC: 2.35 MIL/uL — ABNORMAL LOW (ref 4.22–5.81)
RDW: 14.5 % (ref 11.5–15.5)
WBC: 10.4 10*3/uL (ref 4.0–10.5)

## 2014-04-06 LAB — PREPARE RBC (CROSSMATCH)

## 2014-04-06 LAB — ABO/RH: ABO/RH(D): A POS

## 2014-04-06 MED ORDER — ALTEPLASE 2 MG IJ SOLR
5.0000 mg | Freq: Once | INTRAMUSCULAR | Status: AC
  Administered 2014-04-06: 5 mg
  Filled 2014-04-06: qty 6

## 2014-04-06 MED ORDER — DOXERCALCIFEROL 4 MCG/2ML IV SOLN
INTRAVENOUS | Status: AC
  Filled 2014-04-06: qty 2

## 2014-04-06 MED ORDER — HYDROXYZINE HCL 10 MG PO TABS
10.0000 mg | ORAL_TABLET | Freq: Three times a day (TID) | ORAL | Status: DC | PRN
  Filled 2014-04-06: qty 1

## 2014-04-06 NOTE — Anesthesia Postprocedure Evaluation (Signed)
  Anesthesia Post-op Note  Patient: [name] [name]  Procedure(s) Performed: Procedure(s): CREATION OF ARTERIOVENOUS (AV) FISTULA  (Left) ULTRASOUND GUIDED INSERTION OF DIALYSIS CATHETER (Right)  Patient Location: PACU  Anesthesia Type:MAC  Level of Consciousness: awake, alert , oriented and patient cooperative  Airway and Oxygen Therapy: Patient Spontanous Breathing  Post-op Pain: mild  Post-op Assessment: Post-op Vital signs reviewed, Patient's Cardiovascular Status Stable, Respiratory Function Stable, Patent Airway and No signs of Nausea or vomiting  Post-op Vital Signs: stable  Last Vitals:  Filed Vitals:   04/06/14 0542  BP: 146/84  Pulse: 80  Temp: 36.7 C  Resp: 16    Complications: No apparent anesthesia complications

## 2014-04-06 NOTE — Progress Notes (Signed)
   VASCULAR PROGRESS NOTE  SUBJECTIVE: No complaints.   PHYSICAL EXAM: Filed Vitals:   04/05/14 1345 04/05/14 1800 04/05/14 2045 04/06/14 0542  BP: 111/77 135/91 128/84 146/84  Pulse:  78 82 80  Temp: 97.9 F (36.6 C) 97.6 F (36.4 C) 98.1 F (36.7 C) 98.1 F (36.7 C)  TempSrc:  Oral Oral Oral  Resp: 14 16 16 16   Height:      Weight:   162 lb 11.2 oz (73.8 kg)   SpO2:  99% 96% 97%   Good thrill in L RC AVF Catheter site fine Active Problems:   Polycystic kidney disease   Acute renal failure   Metabolic acidosis   Essential hypertension, benign   Acute encephalopathy  ASSESSMENT AND PLAN:  * 1 Day Post-Op s/p: Diatek and L RC AVF  *  Will be available if needed.   Gae Gallop [id number] 04/06/2014

## 2014-04-06 NOTE — Progress Notes (Signed)
Patient ID: [name] [name]  male  [id number]    DOB: [birth year]    DOA: 04/02/2014  PCP: No PCP Per Patient  Patient is a 53 year old male with polycystic kidney disease admitted on 6/15 to critical care service with profound metabolic acidosis and acute renal failure.  Assessment/Plan: Principal Problem:   Acute renal failure, new ESRD, history of polycystic kidney disease - Creatinine 25.35 with BUN of 202 at the time of admission, nephrology was consulted and CRRT started, so far tolerating it - Vein mapping done  - vascular surgery consulted, left AV fistula and tunneled dialysis catheter placed 6/18 by Dr. Scot Dock - Medicaid process started, will need CLIP process once Medicaid is approved  Active Problems:   Polycystic kidney disease - Nephrology following closely, CLIP to be started    Metabolic acidosis with hypokalemia, hypomagnesemia, hypocalcemia - Likely due to #1, - continue oral phoslo, recheck BMET    Essential hypertension, benign - Improved    Acute encephalopathy- Resolved   Leukocytosis : Resolved    DVT Prophylaxis:  Code Status:  Family Communication: Alert and oriented, discussed with patient   Disposition: Discussed in detail with Dr. Jamal Maes, nephrology, will assume care. I will sign off.  Consultants:  Nephrology    vascular surgery  Procedures:  CRRT  Antibiotics:  none  Subjective: Patient seen and examined, denies any new complaints, having itching  Objective: Weight change: -0.5 kg (-1 lb 1.6 oz)  Intake/Output Summary (Last 24 hours) at 04/06/14 1030 Last data filed at 04/06/14 0542  Gross per 24 hour  Intake    880 ml  Output    610 ml  Net    270 ml   Blood pressure 146/84, pulse 80, temperature 98.1 F (36.7 C), temperature source Oral, resp. rate 16, height 5\' 9"  (1.753 m), weight 73.8 kg (162 lb 11.2 oz), SpO2 97.00%.  Physical Exam: General: Alert and awake, oriented x3, not in any acute distress. CVS:  S1-S2 clear, no murmur rubs or gallops Chest: clear to auscultation bilaterally, no wheezing, rales or rhonchi Abdomen: soft nontender, nondistended, normal bowel sounds  Extremities: no cyanosis, clubbing or edema noted bilaterally, multiple scabs on the extremities Neuro: Cranial nerves II-XII intact, no focal neurological deficits   Lab Results: Basic Metabolic Panel:  Recent Labs Lab 04/04/14 1333 04/05/14 0536  NA 140 144  K 3.4* 3.4*  CL 100 101  CO2 16* 23  GLUCOSE 187* 121*  BUN 129* 62*  CREATININE 13.29* 7.64*  CALCIUM 5.0* 6.1*  MG 2.2  --   PHOS  --  2.5   Liver Function Tests:  Recent Labs Lab 04/04/14 1333 04/05/14 0536  AST 28  --   ALT 19  --   ALKPHOS 119*  --   BILITOT 0.5  --   PROT 6.0  --   ALBUMIN 3.0* 3.0*   No results found for this basename: LIPASE, AMYLASE,  in the last 168 hours No results found for this basename: AMMONIA,  in the last 168 hours CBC:  Recent Labs Lab 04/02/14 1228 04/03/14 0500 04/04/14 1333  WBC 16.0* 9.3 9.6  NEUTROABS 14.9*  --   --   HGB 9.7* 7.7* 7.3*  HCT 27.6* 21.6* 20.8*  MCV 83.9 80.3 84.9  PLT 341 242 203   Cardiac Enzymes: No results found for this basename: CKTOTAL, CKMB, CKMBINDEX, TROPONINI,  in the last 168 hours BNP: No components found with this basename: POCBNP,  CBG:  Recent Labs Lab  04/02/14 1645  GLUCAP 132*     Micro Results: Recent Results (from the past 240 hour(s))  CULTURE, BLOOD (ROUTINE X 2)     Status: None   Collection Time    04/02/14 12:28 PM      Result Value Ref Range Status   Specimen Description BLOOD RIGHT ARM   Final   Special Requests BOTTLES DRAWN AEROBIC AND ANAEROBIC 10CC   Final   Culture  Setup Time     Final   Value: 04/02/2014 22:51     Performed at [facility]   Culture     Final   Value:        BLOOD CULTURE RECEIVED NO GROWTH TO DATE CULTURE WILL BE HELD FOR 5 DAYS BEFORE ISSUING A FINAL NEGATIVE REPORT     Performed at [facility]   Report Status PENDING   Incomplete  CULTURE, BLOOD (ROUTINE X 2)     Status: None   Collection Time    04/02/14  3:57 PM      Result Value Ref Range Status   Specimen Description BLOOD LEFT ARM   Final   Special Requests BOTTLES DRAWN AEROBIC AND ANAEROBIC 5CC   Final   Culture  Setup Time     Final   Value: 04/02/2014 22:51     Performed at [facility]   Culture     Final   Value:        BLOOD CULTURE RECEIVED NO GROWTH TO DATE CULTURE WILL BE HELD FOR 5 DAYS BEFORE ISSUING A FINAL NEGATIVE REPORT     Performed at [facility]   Report Status PENDING   Incomplete  URINE CULTURE     Status: None   Collection Time    04/02/14  4:50 PM      Result Value Ref Range Status   Specimen Description URINE, CATHETERIZED   Final   Special Requests NONE   Final   Culture  Setup Time     Final   Value: 04/03/2014 00:19     Performed at [place]     Final   Value: NO GROWTH     Performed at [facility]   Culture     Final   Value: NO GROWTH     Performed at [facility]   Report Status 04/04/2014 FINAL   Final  MRSA PCR SCREENING     Status: None   Collection Time    04/02/14  4:52 PM      Result Value Ref Range Status   MRSA by PCR NEGATIVE  NEGATIVE Final   Comment:            The GeneXpert MRSA Assay (FDA     approved for NASAL specimens     only), is one component of a     comprehensive MRSA colonization     surveillance program. It is not     intended to diagnose MRSA     infection nor to guide or     monitor treatment for     MRSA infections.    Studies/Results: US Abdomen Complete  04/03/2014   CLINICAL DATA:  Polycystic kidney disease.  Polycystic liver.  EXAM: ULTRASOUND ABDOMEN COMPLETE  COMPARISON:  None.  FINDINGS: Gallbladder:  Suboptimally visualized due to renal cysts. No stones. Wall thickness 2 mm. No sonographic Murphy sign.  Common bile duct:  Diameter: 5 mm, normal for age.  Liver:   Multiple cysts.  The largest is 18 mm.  No solid mass lesions.  IVC:  No abnormality visualized.  Pancreas:  Visualized portion unremarkable.  Spleen:  6.6 cm.  Normal echotexture.  Right Kidney:  Length: 24.9 cm.  Polycystic Kidney.  Largest cyst measures 54 mm.  Left Kidney:  Length: 24.2 cm.  Polycystic kidney.  Largest cyst measures 64 mm  Abdominal aorta:  Poorly visualized due to enlarged kidneys.  Other findings:  None.  IMPRESSION: Polycystic liver and kidneys.  No acute abnormality.  No gallstones.   Electronically Signed   By: Dereck Ligas M.D.   On: 04/03/2014 00:26   Dg Chest Portable 1 View  04/02/2014   CLINICAL DATA:  Central catheter placed  EXAM: PORTABLE CHEST - 1 VIEW  COMPARISON:  Study obtained earlier in the day  FINDINGS: Central catheter tip is in the superior vena cava. No pneumothorax. There is no edema or consolidation. Heart size and pulmonary vascularity are normal. No adenopathy. No bone lesions.  IMPRESSION: Central catheter tip in superior vena cava. No pneumothorax. Lungs clear.   Electronically Signed   By: Lowella Grip M.D.   On: 04/02/2014 15:58   Dg Chest Port 1 View  04/02/2014   CLINICAL DATA:  Chronic renal insufficiency and polycystic kidney disease  EXAM: PORTABLE CHEST - 1 VIEW  COMPARISON:  None.  FINDINGS: The lungs are mildly hypoinflated which accentuates the perihilar interstitial markings.The cardiac silhouette is top-normal in size. The pulmonary vascularity is not clearly engorged. The observed bony structures are unremarkable.  IMPRESSION: Mildly increased perihilar lung markings may be due to vascular crowding or could reflect subsegmental atelectasis. Otherwise there is no acute cardiopulmonary abnormality.   Electronically Signed   By: David  Martinique   On: 04/02/2014 13:07    Medications: Scheduled Meds: . calcium acetate  1,334 mg Oral TID WC  . darbepoetin (ARANESP) injection - DIALYSIS  200 mcg Intravenous Q Wed-HD  . doxercalciferol  2  mcg Intravenous Q M,W,F-HD  . feeding supplement (ENSURE COMPLETE)  237 mL Oral BID BM  . heparin  5,000 Units Subcutaneous 3 times per day  . metoprolol tartrate  25 mg Oral BID      LOS: 4 days   Rossi Burdo M.D. [facility] 04/06/2014, 10:30 AM Pager: [phone]  If 7PM-7AM, please contact night-coverage [email or web address] Password [email or web address]  **Disclaimer: This note was dictated with voice recognition software. Similar sounding words can inadvertently be transcribed and this note may contain transcription errors which may not have been corrected upon publication of note.**

## 2014-04-06 NOTE — Progress Notes (Signed)
CRITICAL VALUE ALERT  Critical value received: 6.7  Date of notification:  04-06-2014  Time of notification:  0329  Critical value read back: yes  Nurse who received alert:  Ronny Bacon, RN  MD notified (1st page):  Lorrene Reid  Time of first page:  Present at time of notification  MD notified (2nd page):  Time of second page:  Responding MD:  Lorrene Reid  Time MD responded:  [phone]

## 2014-04-06 NOTE — Procedures (Addendum)
I have personally attended this patient's dialysis session.   4 hour treatment 4K bath Tight heparin 3 liter goal Access is TDC R IJ Labs pre HD are still pending   DUNHAM,CYNTHIA B  Add: Hb returned at 6.7 Plan transfusion of 2U PRBC's while on HD Jamal Maes, MD [place] Pager 04/06/2014, 4:24 PM

## 2014-04-06 NOTE — Telephone Encounter (Addendum)
Message copied by Gena Fray on Fri Apr 06, 2014  1:23 PM ------      Message from: Peter Minium K      Created: Thu Apr 05, 2014  2:43 PM      Regarding: Schedule                   ----- Message -----         From: Angelia Mould, MD         Sent: 04/05/2014   1:28 PM           To: Vvs Charge Pool      Subject: charge and f/u                                           PROCEDURE:       1. Ultrasound-guided placement of right IJ 23 cm tunnel dialysis catheter      2. Left radial cephalic AV fistula            SURGEON: Judeth Cornfield. Scot Dock, MD, FACS            ASSIST: Silva Bandy, [facility]      He will need a follow visit in 6 weeks to check on the maturation of this fistula and have a duplex at that time. Thank you. CD ------  04/06/14: lm for pt and mailed letter, dpm

## 2014-04-06 NOTE — Progress Notes (Signed)
Background 53 yo WM with known ADPKD and strongly + FH of ESRD who chose not to seek medical care despite known dx, presented with severe renal failure, profound metabolic acidosis, hypocalcemia, and anemia, necessitating initiation of CRRT acutely for correction of severe metabolic abnormalities, now transitioned to IHD and pursuing outpt placement.  Have taken pt on our service as of 6/19.    ASSESSMENT/PLANS 1. 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 Multiple family members with ADPKD. Markedly enlarged and cystic kidneys. Cystic liver. Numbers markedly better with CRRT (stopped 6/16)  Now transitioned to IHD. CLIP process started. Uninsured so emergency Medicaid being applied for. S/p left radiocephalic AVF and right IJ [facility] 6/18 by Dr. Scot Dock. Geographically will CLIP to [facility] 2. Metabolic acidosis - much improved with bicarb drip/CRRT; discontinued both  6/16. Resolved now with further HD 3. Hypocalcemia/hyperphosphatemia - Calcium based phos binders.   4. Secondary hyperparathyroidism - PTH 1123 - start hectorol 2 mcg with each HD 5. Anemia - Iron stores adequate. Aranesp 200 QWed HD 6. Skin Rauh - unclear etiology; can't say this is all related to uremia 7. Hypertension - started po lopressor (baseline tachycardia) 8. Leukocytosis - resolved  Subjective:  Feels much better No shortness of breath or pain S/p AVF and TDC yesterday Working on his Medicaid application  Objective:   Vital signs in last 24 hours: Filed Vitals:   04/05/14 1800 04/05/14 2045 04/06/14 0542 04/06/14 1037  BP: 135/91 128/84 146/84 126/76  Pulse: 78 82 80 86  Temp: 97.6 F (36.4 C) 98.1 F (36.7 C) 98.1 F (36.7 C) 98 F (36.7 C)  TempSrc: Oral Oral Oral Oral  Resp: 16 16 16 16   Height:      Weight:  73.8 kg (162 lb 11.2 oz)    SpO2: 99% 96% 97% 91%   Weight change: -0.5 kg (-1 lb 1.6 oz)  Intake/Output Summary (Last 24 hours) at 04/06/14 1337 Last data filed at 04/06/14  0900  Gross per 24 hour  Intake    720 ml  Output    700 ml  Net     20 ml    Physical Exam:  Blood pressure 126/76, pulse 86, temperature 98 F (36.7 C), temperature source Oral, resp. rate 16, height 5\' 9"  (1.753 m), weight 73.8 kg (162 lb 11.2 oz), SpO2 91.00%. Thin WM Skin with multiple lesions Lungs clear No pericardial rub Abd soft Only trace edema No asterixus now [facility] right neck with dry dressing Left RC-AVF + bruit (appears may be branching vessels)  No labs yet today  Recent Labs Lab 04/02/14 1228 04/02/14 1557 04/02/14 1800 04/02/14 2140 04/03/14 0245 04/03/14 0500 04/03/14 1540 04/03/14 2304 04/04/14 1333 04/05/14 0536  NA 135* 135* 137 138 140 140 139  --  140 144  K 4.1 3.9 3.6* 2.9* 2.8* 2.8* 2.9* 3.3* 3.4* 3.4*  CL 90* 92* 94* 95* 97 97 97  --  100 101  CO2 <7* <7* <7* 9* 13* 17* 19  --  16* 23  GLUCOSE 184* 139* 152* 143* 117* 146* 194*  --  187* 121*  BUN 202* 201* 197* 179* 150* 133* 108*  --  129* 62*  CREATININE 25.35* 24.55* 22.56* 18.59* 15.74* 13.88* 11.37*  --  13.29* 7.64*  CALCIUM 4.1* 4.0* <4.0* 4.3* 4.9* 5.0* 5.5*  --  5.0* 6.1*  PHOS 14.0* 13.1*  --   --   --  4.0 2.8  --   --  2.5  Recent Labs Lab 04/03/14 1540 04/04/14 1333 04/05/14 0536  AST  --  28  --   ALT  --  19  --   ALKPHOS  --  119*  --   BILITOT  --  0.5  --   PROT  --  6.0  --   ALBUMIN 3.1* 3.0* 3.0*    Recent Labs Lab 04/02/14 1228 04/03/14 0500 04/04/14 1333  WBC 16.0* 9.3 9.6  NEUTROABS 14.9*  --   --   HGB 9.7* 7.7* 7.3*  HCT 27.6* 21.6* 20.8*  MCV 83.9 80.3 84.9  PLT 341 242 203    Recent Labs Lab 04/02/14 1645  GLUCAP 132*    Recent Labs Lab 04/02/14 1557  IRON 178*  TIBC NOT CALC  FERRITIN 383*   Results for [name] [name], [name] [name] (MRN [id number]) as of 04/04/2014 13:16  Ref. Range 04/02/2014 15:57  PTH Latest Range: 14.0-72.0 pg/mL 1120.6 (H)   Studies/Results: Dg Chest Port 1 View  04/05/2014   CLINICAL DATA:  s/p diatek  EXAM: PORTABLE  CHEST - 1 VIEW  COMPARISON:  Portal chest radiograph dated 04/02/2014  FINDINGS: Low lung volumes. Patient is status post right internal jugular insertion of the dialysis catheter tip in the superior vena cava. No pneumothorax. Lungs are clear. Cardiac silhouette and mediastinal contours unremarkable. No acute osseous abnormalities.  IMPRESSION: Patient is status post dialysis catheter insertion. No evidence of pneumothorax.  No acute cardiopulmonary disease.   Electronically Signed   By: Margaree Mackintosh M.D.   On: 04/05/2014 13:48   Dg Fluoro Guide Cv Line-no Report  04/05/2014   CLINICAL DATA: insertion diatek OR # 11   FLOURO GUIDE CV LINE  Fluoroscopy was utilized by the requesting physician.  No radiographic  interpretation.     . sodium chloride Stopped (04/05/14 1300)   . calcium acetate  1,334 mg Oral TID WC  . darbepoetin (ARANESP) injection - DIALYSIS  200 mcg Intravenous Q Wed-HD  . doxercalciferol  2 mcg Intravenous Q M,W,F-HD  . feeding supplement (ENSURE COMPLETE)  237 mL Oral BID BM  . heparin  5,000 Units Subcutaneous 3 times per day  . metoprolol tartrate  25 mg Oral BID   I  have reviewed scheduled and prn medications.  Jamal Maes, MD [place] Pager 04/06/2014, 1:37 PM

## 2014-04-07 LAB — TYPE AND SCREEN
ABO/RH(D): A POS
Antibody Screen: NEGATIVE
UNIT DIVISION: 0
Unit division: 0

## 2014-04-07 LAB — CBC
HEMATOCRIT: 28.1 % — AB (ref 39.0–52.0)
Hemoglobin: 9.1 g/dL — ABNORMAL LOW (ref 13.0–17.0)
MCH: 28.1 pg (ref 26.0–34.0)
MCHC: 32.4 g/dL (ref 30.0–36.0)
MCV: 86.7 fL (ref 78.0–100.0)
Platelets: 146 10*3/uL — ABNORMAL LOW (ref 150–400)
RBC: 3.24 MIL/uL — ABNORMAL LOW (ref 4.22–5.81)
RDW: 16 % — ABNORMAL HIGH (ref 11.5–15.5)
WBC: 12.8 10*3/uL — AB (ref 4.0–10.5)

## 2014-04-07 LAB — RENAL FUNCTION PANEL
Albumin: 3 g/dL — ABNORMAL LOW (ref 3.5–5.2)
BUN: 47 mg/dL — ABNORMAL HIGH (ref 6–23)
CALCIUM: 7.1 mg/dL — AB (ref 8.4–10.5)
CO2: 25 mEq/L (ref 19–32)
CREATININE: 6.26 mg/dL — AB (ref 0.50–1.35)
Chloride: 97 mEq/L (ref 96–112)
GFR calc Af Amer: 11 mL/min — ABNORMAL LOW (ref >90)
GFR calc non Af Amer: 9 mL/min — ABNORMAL LOW (ref >90)
GLUCOSE: 137 mg/dL — AB (ref 70–99)
Phosphorus: 2.8 mg/dL (ref 2.3–4.6)
Potassium: 4.4 mEq/L (ref 3.7–5.3)
Sodium: 140 mEq/L (ref 137–147)

## 2014-04-07 NOTE — Progress Notes (Signed)
Background 53 yo WM with known ADPKD and strongly + FH of ESRD who chose not to seek medical care despite known dx, presented with severe renal failure, profound metabolic acidosis, hypocalcemia, and anemia, necessitating initiation of CRRT acutely for correction of severe metabolic abnormalities, now transitioned to IHD and pursuing outpt placement.  Have taken pt on our service as of 6/19.    ASSESSMENT/PLANS 1. 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 Multiple family members with ADPKD. Markedly enlarged and cystic kidneys. Cystic liver. Numbers markedly better with CRRT (stopped 6/16)  Now transitioned to IHD. CLIP process started. Uninsured so emergency Medicaid being applied for. S/p left radiocephalic AVF and right IJ [facility] 6/18 by Dr. Scot Dock. Geographically will CLIP to [facility] 2. Metabolic acidosis - much improved with bicarb drip/CRRT; discontinued both  6/16. Resolved now with further HD 3. Hypocalcemia/hyperphosphatemia - Calcium based phos binders.   4. Secondary hyperparathyroidism - PTH 1123 - start hectorol 2 mcg with each HD 5. Anemia - Iron stores adequate. Aranesp 200 QWed HD; s/p PRBC's X 2 on 6/19 6. Skin Disla - unclear etiology; can't say this is all related to uremia; resolving 7. Hypertension - started po lopressor (baseline tachycardia) 8. Thrombocytopenia - mild decrease in plts - doubt significant but will check HIT panel  Subjective:  Feels much better No shortness of breath or pain Still working on his Medicaid application Had issues with HD cath yest - required TPA - but was able to complete his treatment Transfused 2 units for Hb 6.7 6/19  Objective:   Vital signs in last 24 hours: Filed Vitals:   04/06/14 2142 04/07/14 0500 04/07/14 0608 04/07/14 0900  BP: 121/87  120/81 125/77  Pulse: 106  73 81  Temp: 97.8 F (36.6 C)  98.2 F (36.8 C) 98.4 F (36.9 C)  TempSrc: Oral  Oral Oral  Resp: 16  20 20   Height: 5\' 9"  (1.753 m)     Weight:  72.621 kg (160 lb 1.6 oz) 72.621 kg (160 lb 1.6 oz)    SpO2: 95%  93% 96%   Weight change: 2.1 kg (4 lb 10.1 oz)  Intake/Output Summary (Last 24 hours) at 04/07/14 1434 Last data filed at 04/07/14 1321  Gross per 24 hour  Intake    910 ml  Output   3118 ml  Net  -2208 ml    Physical Exam:  Blood pressure 125/77, pulse 81, temperature 98.4 F (36.9 C), temperature source Oral, resp. rate 20, height 5\' 9"  (1.753 m), weight 72.621 kg (160 lb 1.6 oz), SpO2 96.00%. Thin WM Skin with multiple lesions Lungs clear No pericardial rub Abd soft No edema No asterixus now [facility] right neck with dry dressing Left RC-AVF + bruit    Recent Labs Lab 04/02/14 1228 04/02/14 1557  04/03/14 0245 04/03/14 0500 04/03/14 1540 04/03/14 2304 04/04/14 1333 04/05/14 0536 04/06/14 1500 04/07/14 0437  NA 135* 135*  < > 140 140 139  --  140 144 138 140  K 4.1 3.9  < > 2.8* 2.8* 2.9* 3.3* 3.4* 3.4* 3.6* 4.4  CL 90* 92*  < > 97 97 97  --  100 101 97 97  CO2 <7* <7*  < > 13* 17* 19  --  16* 23 18* 25  GLUCOSE 184* 139*  < > 117* 146* 194*  --  187* 121* 142* 137*  BUN 202* 201*  < > 150* 133* 108*  --  129* 62* 97* 47*  CREATININE 25.35* 24.55*  < > 15.74* 13.88* 11.37*  --  13.29* 7.64* 10.73* 6.26*  CALCIUM 4.1* 4.0*  < > 4.9* 5.0* 5.5*  --  5.0* 6.1* 6.1* 7.1*  PHOS 14.0* 13.1*  --   --  4.0 2.8  --   --  2.5 3.6 2.8  < > = values in this interval not displayed.   Recent Labs Lab 04/04/14 1333 04/05/14 0536 04/06/14 1500 04/07/14 0437  AST 28  --   --   --   ALT 19  --   --   --   ALKPHOS 119*  --   --   --   BILITOT 0.5  --   --   --   PROT 6.0  --   --   --   ALBUMIN 3.0* 3.0* 2.8* 3.0*    Recent Labs Lab 04/02/14 1228 04/03/14 0500 04/04/14 1333 04/06/14 1500 04/07/14 0437  WBC 16.0* 9.3 9.6 10.4 12.8*  NEUTROABS 14.9*  --   --   --   --   HGB 9.7* 7.7* 7.3* 6.7* 9.1*  HCT 27.6* 21.6* 20.8* 20.7* 28.1*  MCV 83.9 80.3 84.9 88.1 86.7  PLT 341 242 203 180 146*    Recent  Labs Lab 04/02/14 1645  GLUCAP 132*    Recent Labs Lab 04/02/14 1557  IRON 178*  TIBC NOT CALC  FERRITIN 383*   Results for TABB, NORIEGA (MRN [id number]) as of 04/04/2014 13:16  Ref. Range 04/02/2014 15:57  PTH Latest Range: 14.0-72.0 pg/mL 1120.6 (H)   SCHEDULED MEDICATIONS . sodium chloride Stopped (04/05/14 1300)   . calcium acetate  1,334 mg Oral TID WC  . darbepoetin (ARANESP) injection - DIALYSIS  200 mcg Intravenous Q Wed-HD  . doxercalciferol  2 mcg Intravenous Q M,W,F-HD  . feeding supplement (ENSURE COMPLETE)  237 mL Oral BID BM  . metoprolol tartrate  25 mg Oral BID    Jamal Maes, MD [facility] [phone] Pager 04/07/2014, 2:34 PM

## 2014-04-08 ENCOUNTER — Encounter (HOSPITAL_COMMUNITY): Payer: Self-pay | Admitting: Vascular Surgery

## 2014-04-08 LAB — RENAL FUNCTION PANEL
Albumin: 2.8 g/dL — ABNORMAL LOW (ref 3.5–5.2)
BUN: 73 mg/dL — ABNORMAL HIGH (ref 6–23)
CALCIUM: 6.9 mg/dL — AB (ref 8.4–10.5)
CO2: 24 meq/L (ref 19–32)
Chloride: 98 mEq/L (ref 96–112)
Creatinine, Ser: 9.19 mg/dL — ABNORMAL HIGH (ref 0.50–1.35)
GFR, EST AFRICAN AMERICAN: 7 mL/min — AB (ref >90)
GFR, EST NON AFRICAN AMERICAN: 6 mL/min — AB (ref >90)
Glucose, Bld: 123 mg/dL — ABNORMAL HIGH (ref 70–99)
PHOSPHORUS: 3.3 mg/dL (ref 2.3–4.6)
Potassium: 4.3 mEq/L (ref 3.7–5.3)
SODIUM: 139 meq/L (ref 137–147)

## 2014-04-08 LAB — CULTURE, BLOOD (ROUTINE X 2)
CULTURE: NO GROWTH
Culture: NO GROWTH

## 2014-04-08 LAB — CBC
HCT: 25.6 % — ABNORMAL LOW (ref 39.0–52.0)
HEMOGLOBIN: 8.2 g/dL — AB (ref 13.0–17.0)
MCH: 28.6 pg (ref 26.0–34.0)
MCHC: 32 g/dL (ref 30.0–36.0)
MCV: 89.2 fL (ref 78.0–100.0)
Platelets: 139 10*3/uL — ABNORMAL LOW (ref 150–400)
RBC: 2.87 MIL/uL — ABNORMAL LOW (ref 4.22–5.81)
RDW: 16.4 % — AB (ref 11.5–15.5)
WBC: 12.3 10*3/uL — AB (ref 4.0–10.5)

## 2014-04-08 MED ORDER — RENA-VITE PO TABS
1.0000 | ORAL_TABLET | Freq: Every morning | ORAL | Status: DC
  Administered 2014-04-08 – 2014-04-10 (×3): 1 via ORAL
  Filled 2014-04-08 (×3): qty 1

## 2014-04-08 MED ORDER — METOPROLOL TARTRATE 25 MG PO TABS
25.0000 mg | ORAL_TABLET | Freq: Two times a day (BID) | ORAL | Status: DC
  Administered 2014-04-08 – 2014-04-10 (×4): 25 mg via ORAL
  Filled 2014-04-08 (×6): qty 1

## 2014-04-08 NOTE — Progress Notes (Signed)
Background 53 yo WM with known ADPKD and strongly + FH of ESRD who chose not to seek medical care despite known dx, presented 6/15 with severe renal failure (BUN/creat 202/25.3)  , profound metabolic acidosis (pH 7, unmeas bicarb) hypocalcemia (calcium 4, phos 13.5)), necessitating initiation of CRRT acutely for correction of severe metabolic abnormalities, transitioned to IHD 6/17  and pursuing outpt placement.  CLIP process pending (pt uninsured) Have taken pt on our service as of 6/19.    ASSESSMENT/PLANS 1. 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 Multiple family members with ADPKD. Markedly enlarged and cystic kidneys. Cystic liver. Numbers markedly better with CRRT (stopped 6/16)  Now transitioned to IHD. S/p 3 treatments. CLIP process started. Uninsured so emergency Medicaid being applied for. S/p left radiocephalic AVF and right IJ [facility] 6/18 by Dr. Scot Dock. Geographically will CLIP to [facility]. Next HD 6/22 (pending an outpt schedule 2. Vascular access - s/p TDC R IJ and left RC AVF 6/18 (Dr. Scot Dock wants 6 week followup) 3. Metabolic acidosis - much improved with bicarb drip/CRRT; discontinued both  6/16. Resolved now with further HD 4. Hypocalcemia/hyperphosphatemia - Calcium based phos binders. Calcium improved. Phos running low side  5. Secondary hyperparathyroidism - PTH 1123 - started hectorol 2 mcg with each HD 6. Anemia - Iron stores adequate. Aranesp 200 QWed HD; s/p PRBC's X 2 on 6/19 for Hb of 6.7. Up to 9.1 drifted back to 8.2 Check stools. 7. Skin [place] - unclear etiology; can't say this is all related to uremia; resolving; advised to stop "picking" at skin 8. Hypertension - started po lopressor (baseline tachycardia) and BP's now at goal 9. Thrombocytopenia - mild decrease in plts - doubt significant but will check HIT panel and hold hep with HD 10. Glucose intolerance - has had some elevated BS's this admit. No prior DM history. Check A1C.  Subjective:  Feels  "great" No shortness of breath or pain Walking in the halls Still working on his Medicaid application  Objective:   Vital signs in last 24 hours: Filed Vitals:   04/07/14 0900 04/07/14 1702 04/07/14 2045 04/08/14 0451  BP: 125/77 134/80 117/79 116/77  Pulse: 81 78 93 81  Temp: 98.4 F (36.9 C) 98.1 F (36.7 C) 98.1 F (36.7 C) 97.1 F (36.2 C)  TempSrc: Oral Oral Oral Oral  Resp: 20 20 18 18   Height:      Weight:    74.98 kg (165 lb 4.8 oz)  SpO2: 96% 97% 93% 91%   Weight change: -0.921 kg (-2 lb 0.5 oz)  Intake/Output Summary (Last 24 hours) at 04/08/14 0827 Last data filed at 04/07/14 1856  Gross per 24 hour  Intake    840 ml  Output    625 ml  Net    215 ml    Physical Exam:  Blood pressure 116/77, pulse 81, temperature 97.1 F (36.2 C), temperature source Oral, resp. rate 18, height 5\' 9"  (1.753 m), weight 74.98 kg (165 lb 4.8 oz), SpO2 91.00%. Thin WM NAD Skin with multiple lesions that are fading, other that are unroofed d/t his picking at them Lungs clear No pericardial rub Abd soft No edema No asterixus TDC right neck with dry dressing Left RC-AVF + bruit; incisions dry   Recent Labs Lab 04/02/14 1557  04/03/14 0500 04/03/14 1540 04/03/14 2304 04/04/14 1333 04/05/14 0536 04/06/14 1500 04/07/14 0437 04/08/14 0513  NA 135*  < > 140 139  --  140 144 138 140 139  K 3.9  < > 2.8* 2.9* 3.3* 3.4* 3.4* 3.6* 4.4 4.3  CL 92*  < > 97 97  --  100 101 97 97 98  CO2 <7*  < > 17* 19  --  16* 23 18* 25 24  GLUCOSE 139*  < > 146* 194*  --  187* 121* 142* 137* 123*  BUN 201*  < > 133* 108*  --  129* 62* 97* 47* 73*  CREATININE 24.55*  < > 13.88* 11.37*  --  13.29* 7.64* 10.73* 6.26* 9.19*  CALCIUM 4.0*  < > 5.0* 5.5*  --  5.0* 6.1* 6.1* 7.1* 6.9*  PHOS 13.1*  --  4.0 2.8  --   --  2.5 3.6 2.8 3.3  < > = values in this interval not displayed.   Recent Labs Lab 04/04/14 1333  04/06/14 1500 04/07/14 0437 04/08/14 0513  AST 28  --   --   --   --   ALT 19   --   --   --   --   ALKPHOS 119*  --   --   --   --   BILITOT 0.5  --   --   --   --   PROT 6.0  --   --   --   --   ALBUMIN 3.0*  < > 2.8* 3.0* 2.8*  < > = values in this interval not displayed.  Recent Labs Lab 04/02/14 1228  04/04/14 1333 04/06/14 1500 04/07/14 0437 04/08/14 0513  WBC 16.0*  < > 9.6 10.4 12.8* 12.3*  NEUTROABS 14.9*  --   --   --   --   --   HGB 9.7*  < > 7.3* 6.7* 9.1* 8.2*  HCT 27.6*  < > 20.8* 20.7* 28.1* 25.6*  MCV 83.9  < > 84.9 88.1 86.7 89.2  PLT 341  < > 203 180 146* 139*  < > = values in this interval not displayed.  Recent Labs Lab 04/02/14 1645  GLUCAP 132*    Recent Labs Lab 04/02/14 1557  IRON 178*  TIBC NOT CALC  FERRITIN 383*   Results for [name] [name], [name] [name] (MRN [id number]) as of 04/04/2014 13:16  Ref. Range 04/02/2014 15:57  PTH Latest Range: 14.0-72.0 pg/mL 1120.6 (H)   SCHEDULED MEDICATIONS . sodium chloride Stopped (04/05/14 1300)   . calcium acetate  1,334 mg Oral TID WC  . darbepoetin (ARANESP) injection - DIALYSIS  200 mcg Intravenous Q Wed-HD  . doxercalciferol  2 mcg Intravenous Q M,W,F-HD  . feeding supplement (ENSURE COMPLETE)  237 mL Oral BID BM  . metoprolol tartrate  25 mg Oral BID    Jamal Maes, MD [facility] [phone] Pager 04/08/2014, 8:27 AM

## 2014-04-09 LAB — RENAL FUNCTION PANEL
Albumin: 2.8 g/dL — ABNORMAL LOW (ref 3.5–5.2)
BUN: 97 mg/dL — ABNORMAL HIGH (ref 6–23)
CO2: 22 mEq/L (ref 19–32)
Calcium: 6.6 mg/dL — ABNORMAL LOW (ref 8.4–10.5)
Chloride: 95 mEq/L — ABNORMAL LOW (ref 96–112)
Creatinine, Ser: 10.59 mg/dL — ABNORMAL HIGH (ref 0.50–1.35)
GFR, EST AFRICAN AMERICAN: 6 mL/min — AB (ref >90)
GFR, EST NON AFRICAN AMERICAN: 5 mL/min — AB (ref >90)
Glucose, Bld: 111 mg/dL — ABNORMAL HIGH (ref 70–99)
PHOSPHORUS: 3.4 mg/dL (ref 2.3–4.6)
POTASSIUM: 4.5 meq/L (ref 3.7–5.3)
SODIUM: 137 meq/L (ref 137–147)

## 2014-04-09 LAB — HEMOGLOBIN A1C
HEMOGLOBIN A1C: 5.5 % (ref <5.7)
Mean Plasma Glucose: 111 mg/dL (ref <117)

## 2014-04-09 LAB — CBC
HCT: 26 % — ABNORMAL LOW (ref 39.0–52.0)
Hemoglobin: 8.2 g/dL — ABNORMAL LOW (ref 13.0–17.0)
MCH: 28.6 pg (ref 26.0–34.0)
MCHC: 31.5 g/dL (ref 30.0–36.0)
MCV: 90.6 fL (ref 78.0–100.0)
Platelets: 131 10*3/uL — ABNORMAL LOW (ref 150–400)
RBC: 2.87 MIL/uL — AB (ref 4.22–5.81)
RDW: 16.4 % — ABNORMAL HIGH (ref 11.5–15.5)
WBC: 10.9 10*3/uL — AB (ref 4.0–10.5)

## 2014-04-09 MED ORDER — DOXERCALCIFEROL 4 MCG/2ML IV SOLN
INTRAVENOUS | Status: AC
  Filled 2014-04-09: qty 2

## 2014-04-09 NOTE — Procedures (Signed)
Patient was seen on dialysis and the procedure was supervised.  BFR 400  Via PC BP is  112/56.   Patient appears to be tolerating treatment well  GOLDSBOROUGH,[name] [name]

## 2014-04-09 NOTE — Progress Notes (Signed)
Background 53 yo WM with known ADPKD and strongly + FH of ESRD who chose not to seek medical care despite known dx, presented 6/15 with severe renal failure (BUN/creat 202/25.3) ,  necessitating initiation of CRRT acutely for correction of severe metabolic abnormalities, transitioned to IHD 6/17  and pursuing outpt placement.  CLIP process pending (pt uninsured) Have taken pt on our service as of 6/19.    ASSESSMENT/PLANS 1. 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 Multiple family members with ADPKD. Markedly enlarged and cystic kidneys. Cystic liver.  Now transitioned to IHD. S/p 3 treatments. CLIP process started. Uninsured so emergency Medicaid being applied for. S/p left radiocephalic AVF and right IJ [facility] 6/18 by Dr. Scot Dock. Geographically will CLIP to [facility]. Next HD 6/22 (pending an outpt schedule) 2. Vascular access - s/p TDC R IJ and left RC AVF 6/18 (Dr. Scot Dock wants 6 week followup) 3. Metabolic acidosis - much improved  HD 4. Hypocalcemia/hyperphosphatemia - Calcium based phos binders. Calcium improved. Phos running low side  5. Secondary hyperparathyroidism - PTH 1123 - started hectorol 2 mcg with each HD 6. Anemia - Iron stores adequate. Aranesp 200 QWed HD; s/p PRBC's X 2 on 6/19 for Hb of 6.7.  7. Skin [place] - unclear etiology; can't say this is all related to uremia; resolving; advised to stop "picking" at skin 8. Hypertension - started po lopressor (baseline tachycardia) and BP's now at goal 9. Thrombocytopenia - mild decrease in plts - doubt significant but will check HIT panel and hold hep with HD 10. Glucose intolerance - has had some elevated BS's this admit. No prior DM history. Check A1C.  Subjective:  Feels "great" No shortness of breath or pain Walking in the halls- thinks he has done all the paperwork needed    Objective:   Vital signs in last 24 hours: Filed Vitals:   04/09/14 1010 04/09/14 1155 04/09/14 1218 04/09/14 1222  BP: 120/74 134/90  111/66 136/70  Pulse: 80 87 81 81  Temp: 98 F (36.7 C) 97.6 F (36.4 C)    TempSrc: Oral Oral Oral   Resp: 17 18 16    Height:      Weight:  76.7 kg (169 lb 1.5 oz)    SpO2: 93% 98%     Weight change: 0.72 kg (1 lb 9.4 oz)  Intake/Output Summary (Last 24 hours) at 04/09/14 1246 Last data filed at 04/08/14 2146  Gross per 24 hour  Intake    120 ml  Output    200 ml  Net    -80 ml    Physical Exam:  Blood pressure 136/70, pulse 81, temperature 97.6 F (36.4 C), temperature source Oral, resp. rate 16, height 5\' 9"  (1.753 m), weight 76.7 kg (169 lb 1.5 oz), SpO2 98.00%. Thin WM NAD Skin with multiple lesions that are fading, other that are unroofed d/t his picking at them Lungs clear No pericardial rub Abd soft TDC right neck with dry dressing Left RC-AVF + bruit; incisions dry   Recent Labs Lab 04/03/14 0500 04/03/14 1540 04/03/14 2304 04/04/14 1333 04/05/14 0536 04/06/14 1500 04/07/14 0437 04/08/14 0513 04/09/14 0353  NA 140 139  --  140 144 138 140 139 137  K 2.8* 2.9* 3.3* 3.4* 3.4* 3.6* 4.4 4.3 4.5  CL 97 97  --  100 101 97 97 98 95*  CO2 17* 19  --  16* 23 18* 25 24 22   GLUCOSE 146* 194*  --  187* 121* 142*  137* 123* 111*  BUN 133* 108*  --  129* 62* 97* 47* 73* 97*  CREATININE 13.88* 11.37*  --  13.29* 7.64* 10.73* 6.26* 9.19* 10.59*  CALCIUM 5.0* 5.5*  --  5.0* 6.1* 6.1* 7.1* 6.9* 6.6*  PHOS 4.0 2.8  --   --  2.5 3.6 2.8 3.3 3.4     Recent Labs Lab 04/04/14 1333  04/07/14 0437 04/08/14 0513 04/09/14 0353  AST 28  --   --   --   --   ALT 19  --   --   --   --   ALKPHOS 119*  --   --   --   --   BILITOT 0.5  --   --   --   --   PROT 6.0  --   --   --   --   ALBUMIN 3.0*  < > 3.0* 2.8* 2.8*  < > = values in this interval not displayed.  Recent Labs Lab 04/06/14 1500 04/07/14 0437 04/08/14 0513 04/09/14 0353  WBC 10.4 12.8* 12.3* 10.9*  HGB 6.7* 9.1* 8.2* 8.2*  HCT 20.7* 28.1* 25.6* 26.0*  MCV 88.1 86.7 89.2 90.6  PLT 180 146* 139* 131*     Recent Labs Lab 04/02/14 1645  GLUCAP 132*    Recent Labs Lab 04/02/14 1557  IRON 178*  TIBC NOT CALC  FERRITIN 383*   Results for AMITAI, CORNELY (MRN [id number]) as of 04/04/2014 13:16  Ref. Range 04/02/2014 15:57  PTH Latest Range: 14.0-72.0 pg/mL 1120.6 (H)   SCHEDULED MEDICATIONS . sodium chloride Stopped (04/05/14 1300)   . calcium acetate  1,334 mg Oral TID WC  . darbepoetin (ARANESP) injection - DIALYSIS  200 mcg Intravenous Q Wed-HD  . doxercalciferol  2 mcg Intravenous Q M,W,F-HD  . feeding supplement (ENSURE COMPLETE)  237 mL Oral BID BM  . metoprolol tartrate  25 mg Oral BID  . multivitamin  1 tablet Oral q morning - 10a    GOLDSBOROUGH,KELLIE A   04/09/2014, 12:46 PM

## 2014-04-10 LAB — HEPARIN INDUCED THROMBOCYTOPENIA PNL
Heparin Induced Plt Ab: NEGATIVE
Patient O.D.: 0.076
UFH HIGH DOSE UFH H: 0 %
UFH Low Dose 0.1 IU/mL: 0 % Release
UFH Low Dose 0.5 IU/mL: 1 % Release
UFH SRA RESULT: NEGATIVE

## 2014-04-10 MED ORDER — METOPROLOL TARTRATE 12.5 MG HALF TABLET
12.5000 mg | ORAL_TABLET | Freq: Two times a day (BID) | ORAL | Status: DC
  Filled 2014-04-10: qty 1

## 2014-04-10 MED ORDER — METOPROLOL TARTRATE 25 MG PO TABS: 12.5000 mg | ORAL_TABLET | Freq: Two times a day (BID) | ORAL | Status: DC

## 2014-04-10 MED ORDER — RENA-VITE PO TABS: 1.0000 | ORAL_TABLET | Freq: Every morning | ORAL | Status: AC

## 2014-04-10 MED ORDER — CALCIUM ACETATE 667 MG PO CAPS: 1334.0000 mg | ORAL_CAPSULE | Freq: Three times a day (TID) | ORAL | Status: DC

## 2014-04-10 MED ORDER — ENSURE COMPLETE PO LIQD: 237.0000 mL | Freq: Two times a day (BID) | ORAL | Status: DC

## 2014-04-10 NOTE — Progress Notes (Signed)
04/10/2014 10:02 AM Hemodialysis Outpatient Note; this patient has been accepted at the [facility] on a Tuesday, Thursday and Saturday 2nd shift schedule. Patient can begin treatment Thursday April 12, 2014 at 11:00 AM. Thank you. Gordy Savers

## 2014-04-10 NOTE — Progress Notes (Signed)
Background 53 yo WM with known ADPKD and strongly + FH of ESRD who chose not to seek medical care despite known dx, presented 6/15 with severe renal failure (BUN/creat 202/25.3) ,  necessitating initiation of CRRT acutely for correction of severe metabolic abnormalities, transitioned to IHD 6/17  and pursuing outpt placement.  CLIP process pending (pt uninsured) Have taken pt on our service as of 6/19.  Ow found out that [facility] will go to [facility] can start on Thursday  ASSESSMENT/PLANS 1. 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 Multiple family members with ADPKD. Markedly enlarged and cystic kidneys. Cystic liver.  Now transitioned to IHD. S/p 3 treatments. CLIP process done, [facility] spot at [facility].  S/p left radiocephalic AVF and right IJ [facility] 6/18 by Dr. Scot Dock. Geographically will CLIP to [facility]. Next HD on Thursday at [place] 2. Vascular access - s/p TDC R IJ and left RC AVF 6/18 (Dr. Scot Dock wants 6 week followup) 3. Metabolic acidosis - much improved  HD 4. Hypocalcemia/hyperphosphatemia - Calcium based phos binders. Calcium improved. Phos running low side  5. Secondary hyperparathyroidism - PTH 1123 - started hectorol 2 mcg with each HD 6. Anemia - Iron stores adequate. Aranesp 200 QWed HD; s/p PRBC's X 2 on 6/19 for Hb of 6.7. - now in the 8's 7. Skin [place] - unclear etiology; can't say this is all related to uremia; resolving; advised to stop "picking" at skin 8. Hypertension - started po lopressor for tachy and BP's low, will decrease to 12.5 BID 9. Thrombocytopenia -  doubt significant - plan was to check HIT panel and hold hep with HD- actually never got done- platelets 131- resume heparin 10. Glucose intolerance - has had some elevated BS's this admit. No prior DM history. Check A1C.- was 5.5%- no action needed at this time 11. Dispo- has spot at [facility] dialysis center so will D/C to home today  Subjective:  Feels "great"  Walking in the halls-    Objective:   Vital signs in last  24 hours: Filed Vitals:   04/09/14 1629 04/09/14 1710 04/09/14 2055 04/10/14 0936  BP: 130/74 100/70 95/63 97/64   Pulse: 70 112 94 68  Temp: 97.9 F (36.6 C) 97.6 F (36.4 C) 97.7 F (36.5 C) 97.7 F (36.5 C)  TempSrc: Oral Oral Oral Oral  Resp: 16 17 18 16   Height:      Weight: 74.8 kg (164 lb 14.5 oz)     SpO2: 94%  94% 93%   Weight change: 1 kg (2 lb 3.3 oz)  Intake/Output Summary (Last 24 hours) at 04/10/14 1148 Last data filed at 04/10/14 0845  Gross per 24 hour  Intake    360 ml  Output   2400 ml  Net  -2040 ml    Physical Exam:  Blood pressure 97/64, pulse 68, temperature 97.7 F (36.5 C), temperature source Oral, resp. rate 16, height 5\' 9"  (1.753 m), weight 74.8 kg (164 lb 14.5 oz), SpO2 93.00%. Thin WM NAD Skin with multiple lesions that are fading, other that are unroofed d/t his picking at them Lungs clear No pericardial rub Abd soft TDC right neck with dry dressing Left RC-AVF + bruit; incisions dry   Recent Labs Lab 04/03/14 1540 04/03/14 2304 04/04/14 1333 04/05/14 0536 04/06/14 1500 04/07/14 0437 04/08/14 0513 04/09/14 0353  NA 139  --  140 144 138 140 139 137  K 2.9* 3.3* 3.4* 3.4* 3.6* 4.4 4.3 4.5  CL 97  --  100 101 97 97 98 95*  CO2 19  --  16* 23 18* 25 24 22   GLUCOSE 194*  --  187* 121* 142* 137* 123* 111*  BUN 108*  --  129* 62* 97* 47* 73* 97*  CREATININE 11.37*  --  13.29* 7.64* 10.73* 6.26* 9.19* 10.59*  CALCIUM 5.5*  --  5.0* 6.1* 6.1* 7.1* 6.9* 6.6*  PHOS 2.8  --   --  2.5 3.6 2.8 3.3 3.4     Recent Labs Lab 04/04/14 1333  04/07/14 0437 04/08/14 0513 04/09/14 0353  AST 28  --   --   --   --   ALT 19  --   --   --   --   ALKPHOS 119*  --   --   --   --   BILITOT 0.5  --   --   --   --   PROT 6.0  --   --   --   --   ALBUMIN 3.0*  < > 3.0* 2.8* 2.8*  < > = values in this interval not displayed.  Recent Labs Lab 04/06/14 1500 04/07/14 0437 04/08/14 0513 04/09/14 0353  WBC 10.4 12.8* 12.3* 10.9*  HGB 6.7* 9.1*  8.2* 8.2*  HCT 20.7* 28.1* 25.6* 26.0*  MCV 88.1 86.7 89.2 90.6  PLT 180 146* 139* 131*   No results found for this basename: GLUCAP,  in the last 168 hours No results found for this basename: IRON, TIBC, TRANSFERRIN, FERRITIN,  in the last 168 hours Results for KAMI, ROTAR (MRN [id number]) as of 04/04/2014 13:16  Ref. Range 04/02/2014 15:57  PTH Latest Range: 14.0-72.0 pg/mL 1120.6 (H)   SCHEDULED MEDICATIONS . sodium chloride Stopped (04/05/14 1300)   . calcium acetate  1,334 mg Oral TID WC  . darbepoetin (ARANESP) injection - DIALYSIS  200 mcg Intravenous Q Wed-HD  . doxercalciferol  2 mcg Intravenous Q M,W,F-HD  . feeding supplement (ENSURE COMPLETE)  237 mL Oral BID BM  . metoprolol tartrate  25 mg Oral BID  . multivitamin  1 tablet Oral q morning - 10a    GOLDSBOROUGH,KELLIE A   04/10/2014, 11:48 AM

## 2014-04-10 NOTE — Discharge Summary (Signed)
Physician Discharge Summary  Patient ID: [name] [name] MRN: [id number] DOB/AGE: January 17, 1961 53 y.o.  Admit date: 04/02/2014 Discharge date: 04/10/2014  Discharge Diagnoses:  Active Problems:   Polycystic kidney disease   Acute renal failure   Metabolic acidosis   Essential hypertension, benign   Acute encephalopathy   Discharged Condition: good  Hospital Course: Karlis Kreh is a 53 year old patient with adult-dominant polycystic kidney disease, known for ten years with strong family history, who chose not to seek medical care, but presented to [facility] 6/15 with anorexia, fatigue, swelling, and dyspnea, secondary to severe renal failure (BUN/Cr 202/25.3), profound metabolic acidosis (pH 7 with unmeasured bicarb), and hypocalcemia (Ca 4, P 13.5), requiring initiation of CRRT acutely for correction of severe metabolic abnormalities and subsequent transition to inpatient hemodialysis on 6/17.    1. ADPKD - Although he had had known disease for at least ten years, he chose not to seek medical care. After he was admitted 6/15 with severe renal failure, he responded well with CRRT and was transitioned to HD the next day.  On 6/18 he received a right IJ dialysis catheter and a left radiocephalic AV fistula and has follow-up with Dr. Scot Dock of [facility] in six weeks. He is currently tolerating hemodialysis well and will begin outpatient treatments at the [facility] on [id number]. 2. Metabolic acidosis - He improved significantly with a bicarb drip and CRRT, both of which were discontinued on 6/16, and has been stable on hemodialysis.  3. Secondary hyperparathyroidism - His calcium has improved to 6.6 (7.6 corrected), and his phosphorus most recently is 3.4.  He received calcium gluconate on 6/18 and will continue calcium-based phosphorus binders.  His intact PTH was initially elevated at 1123, but he will now receive Hectorol 2 mcg per dialysis. 4. Anemia - He required 2 units of PRBCs on 6/19 for a  hemoglobin of 6.7, which has improved to 8.2, and he will continue to receive Aranesp 200 mcg weekly at dialysis.  Iron stores are currently adequate. 5. Hypertension - His blood pressure is now fairly stable on Metoprolol 12.5 mg bid. 6. Thrombocytopenia - His platelets dropped from 341K on 6/15 to 131K on 6/22.  Heparin was stopped briefly, but then resumed.  HIT panel was not checked, but platelets should be monitored closely as outpatient. 7. Glucose intolerance - Although his blood glucose was mildly elevated at times, he has no history of diabetes and his Hgb A1C was 5.5.  Treatments: dialysis: CRRT and hemodialysis Blood pressure 97/64, pulse 68, temperature 97.7 F (36.5 C), temperature source Oral, resp. rate 16, height 5\' 9"  (1.753 m), weight 74.8 kg (164 lb 14.5 oz), SpO2 93.00%.  Disposition: Final discharge disposition not confirmed  Discharge Instructions   Activity as tolerated    Complete by:  As directed      Avoid straining    Complete by:  As directed      Bring all medications to your doctor's appointment    Complete by:  As directed      CALL 911 for chest discomfort not relieved by NTG or lasting longer than 20 minutes    Complete by:  As directed      Call doctor for chest discomfort that is more frequent or severe    Complete by:  As directed      Call doctor for fainting or near blackouts    Complete by:  As directed      Call doctor for more than 3-4 kg weight  gain between hemodialysis treatments    Complete by:  As directed      Call doctor for shortness of breath, with or without a dry hacking cough    Complete by:  As directed      Call doctor for swellling in the hands, feet or stomach not improved after hemodialysis    Complete by:  As directed      Call doctor if you have to sleep on extra pillows at night in order to breathe    Complete by:  As directed      Continue to follow the Renal Care Notes you received during your hospital stay    Complete by:   As directed      Diet renal 80/[id number]    Complete by:  As directed      Do not skip any hemodialysis appointments unless directed by your doctor    Complete by:  As directed      STOP ANY ACTIVITY THAT CAUSES CHEST PAIN, SHORTNESS OF BREATH, DIZZINESS, SWEATING OR EXCESSIVE WEAKNESS    Complete by:  As directed             Medication List    STOP taking these medications       ibuprofen 200 MG tablet  Commonly known as:  ADVIL,MOTRIN      TAKE these medications       calcium acetate 667 MG capsule  Commonly known as:  PHOSLO  Take 2 capsules (1,334 mg total) by mouth 3 (three) times daily with meals.     cetirizine 10 MG tablet  Commonly known as:  ZYRTEC  Take 10 mg by mouth daily as needed for allergies.     feeding supplement (ENSURE COMPLETE) Liqd  Take 237 mLs by mouth 2 (two) times daily between meals.     metoprolol tartrate 25 MG tablet  Commonly known as:  LOPRESSOR  Take 0.5 tablets (12.5 mg total) by mouth 2 (two) times daily.     multivitamin Tabs tablet  Take 1 tablet by mouth every morning.           Follow-up Information   Follow up with DICKSON,CHRISTOPHER S, MD In 6 weeks. (office will call you to arrange an appointment (sent))    Specialty:  Vascular Surgery   Contact information:   [address] [place] [address] [phone]       Signed: Ramiro Harvest 04/10/2014, 12:48 PM    Pt discharged to begin outpt dialysis at [facility]. Dorthia Tout B,MD 05/07/2014 2:18 PM

## 2014-04-18 ENCOUNTER — Other Ambulatory Visit (HOSPITAL_COMMUNITY): Payer: Self-pay | Admitting: Nephrology

## 2014-04-18 DIAGNOSIS — M7989 Other specified soft tissue disorders: Secondary | ICD-10-CM

## 2014-04-20 ENCOUNTER — Emergency Department (HOSPITAL_COMMUNITY)
Admission: EM | Admit: 2014-04-20 | Discharge: 2014-04-20 | Disposition: A | Discharge status: Home / Self Care | Payer: Self-pay | Attending: Emergency Medicine | Admitting: Emergency Medicine

## 2014-04-20 ENCOUNTER — Encounter (HOSPITAL_COMMUNITY): Payer: Self-pay | Admitting: Emergency Medicine

## 2014-04-20 ENCOUNTER — Ambulatory Visit (HOSPITAL_COMMUNITY)
Admission: RE | Admit: 2014-04-20 | Discharge: 2014-04-20 | Disposition: A | Discharge status: Home / Self Care | Payer: Self-pay | Source: Ambulatory Visit | Attending: Nephrology | Admitting: Nephrology

## 2014-04-20 DIAGNOSIS — I82409 Acute embolism and thrombosis of unspecified deep veins of unspecified lower extremity: Secondary | ICD-10-CM | POA: Insufficient documentation

## 2014-04-20 DIAGNOSIS — Z87891 Personal history of nicotine dependence: Secondary | ICD-10-CM | POA: Insufficient documentation

## 2014-04-20 DIAGNOSIS — Z9889 Other specified postprocedural states: Secondary | ICD-10-CM | POA: Insufficient documentation

## 2014-04-20 DIAGNOSIS — N186 End stage renal disease: Secondary | ICD-10-CM | POA: Insufficient documentation

## 2014-04-20 DIAGNOSIS — M7989 Other specified soft tissue disorders: Secondary | ICD-10-CM

## 2014-04-20 DIAGNOSIS — I824Z9 Acute embolism and thrombosis of unspecified deep veins of unspecified distal lower extremity: Secondary | ICD-10-CM | POA: Insufficient documentation

## 2014-04-20 DIAGNOSIS — I824Y9 Acute embolism and thrombosis of unspecified deep veins of unspecified proximal lower extremity: Secondary | ICD-10-CM | POA: Insufficient documentation

## 2014-04-20 DIAGNOSIS — Q613 Polycystic kidney, unspecified: Secondary | ICD-10-CM | POA: Insufficient documentation

## 2014-04-20 DIAGNOSIS — Z992 Dependence on renal dialysis: Secondary | ICD-10-CM | POA: Insufficient documentation

## 2014-04-20 DIAGNOSIS — I82402 Acute embolism and thrombosis of unspecified deep veins of left lower extremity: Secondary | ICD-10-CM

## 2014-04-20 DIAGNOSIS — I120 Hypertensive chronic kidney disease with stage 5 chronic kidney disease or end stage renal disease: Secondary | ICD-10-CM | POA: Insufficient documentation

## 2014-04-20 DIAGNOSIS — Z79899 Other long term (current) drug therapy: Secondary | ICD-10-CM | POA: Insufficient documentation

## 2014-04-20 DIAGNOSIS — Z862 Personal history of diseases of the blood and blood-forming organs and certain disorders involving the immune mechanism: Secondary | ICD-10-CM | POA: Insufficient documentation

## 2014-04-20 LAB — BASIC METABOLIC PANEL
Anion gap: 13 (ref 5–15)
BUN: 38 mg/dL — ABNORMAL HIGH (ref 6–23)
CO2: 29 mEq/L (ref 19–32)
Calcium: 8.8 mg/dL (ref 8.4–10.5)
Chloride: 104 mEq/L (ref 96–112)
Creatinine, Ser: 6.46 mg/dL — ABNORMAL HIGH (ref 0.50–1.35)
GFR calc Af Amer: 10 mL/min — ABNORMAL LOW (ref >90)
GFR calc non Af Amer: 9 mL/min — ABNORMAL LOW (ref >90)
Glucose, Bld: 105 mg/dL — ABNORMAL HIGH (ref 70–99)
Potassium: 4.3 mEq/L (ref 3.7–5.3)
Sodium: 146 mEq/L (ref 137–147)

## 2014-04-20 LAB — CBC WITH DIFFERENTIAL/PLATELET
Basophils Absolute: 0 10*3/uL (ref 0.0–0.1)
Basophils Relative: 1 % (ref 0–1)
Eosinophils Absolute: 0.2 10*3/uL (ref 0.0–0.7)
Eosinophils Relative: 3 % (ref 0–5)
HCT: 30.5 % — ABNORMAL LOW (ref 39.0–52.0)
Hemoglobin: 9.2 g/dL — ABNORMAL LOW (ref 13.0–17.0)
Lymphocytes Relative: 21 % (ref 12–46)
Lymphs Abs: 1.2 10*3/uL (ref 0.7–4.0)
MCH: 29.6 pg (ref 26.0–34.0)
MCHC: 30.2 g/dL (ref 30.0–36.0)
MCV: 98.1 fL (ref 78.0–100.0)
Monocytes Absolute: 0.6 10*3/uL (ref 0.1–1.0)
Monocytes Relative: 12 % (ref 3–12)
Neutro Abs: 3.6 10*3/uL (ref 1.7–7.7)
Neutrophils Relative %: 63 % (ref 43–77)
Platelets: 240 10*3/uL (ref 150–400)
RBC: 3.11 MIL/uL — ABNORMAL LOW (ref 4.22–5.81)
RDW: 17.7 % — ABNORMAL HIGH (ref 11.5–15.5)
WBC: 5.6 10*3/uL (ref 4.0–10.5)

## 2014-04-20 LAB — PROTIME-INR
INR: 1.08 (ref 0.00–1.49)
Prothrombin Time: 14 seconds (ref 11.6–15.2)

## 2014-04-20 MED ORDER — ENOXAPARIN SODIUM 30 MG/0.3ML ~~LOC~~ SOLN: 30.0000 mg | SUBCUTANEOUS | Status: DC

## 2014-04-20 MED ORDER — WARFARIN - PHARMACIST DOSING INPATIENT: Freq: Every day | Status: DC

## 2014-04-20 MED ORDER — WARFARIN SODIUM 7.5 MG PO TABS
7.5000 mg | ORAL_TABLET | Freq: Once | ORAL | Status: AC
  Administered 2014-04-20: 7.5 mg via ORAL
  Filled 2014-04-20: qty 1

## 2014-04-20 MED ORDER — ENOXAPARIN SODIUM 80 MG/0.8ML ~~LOC~~ SOLN
70.0000 mg | SUBCUTANEOUS | Status: DC
  Administered 2014-04-20: 70 mg via SUBCUTANEOUS
  Filled 2014-04-20: qty 0.8

## 2014-04-20 MED ORDER — WARFARIN SODIUM 5 MG PO TABS
5.0000 mg | ORAL_TABLET | Freq: Every day | ORAL | Status: DC
Start: 2014-04-20 — End: 2018-09-16

## 2014-04-20 NOTE — ED Notes (Signed)
Instructions given to pt and pt.s mother on correct Lovenox injection.  They both verbalized understanding of Lovenox and injection technique.

## 2014-04-20 NOTE — Progress Notes (Signed)
ANTICOAGULATION CONSULT NOTE - Initial Consult  Pharmacy Consult for Lovenox + Warfarin Indication: New LLE DVT  No Known Allergies  Patient Measurements: Height: 5' 8.9" (175 cm) Weight: 164 lb 14.5 oz (74.8 kg) IBW/kg (Calculated) : 70.47  Vital Signs: Temp: 97.3 F (36.3 C) (07/03 1108) Temp src: Oral (07/03 1108) BP: 106/64 mmHg (07/03 1145) Pulse Rate: 74 (07/03 1145)  Labs: No results found for this basename: HGB, HCT, PLT, APTT, LABPROT, INR, HEPARINUNFRC, CREATININE, CKTOTAL, CKMB, TROPONINI,  in the last 72 hours  Estimated Creatinine Clearance: 8.2 ml/min (by C-G formula based on Cr of 10.59).   Medical History: Past Medical History  Diagnosis Date  . Chronic kidney disease   . Polycystic kidney disease   . Leucocytosis   . Hypertension   . Anemia   . Shortness of breath     Assessment: 34 YOM recently admitted from 6/15 >> 6/23 with AoCKD (hx polycystic kidney disease), metabolic acidosis, and encephalopathy. The patient required CRRT last admission and was transitioned to IHD-TTS. Represented to the [facility] on 7/3 with LLE swelling and found to be positive for DVT. Pharmacy consulted to dose lovenox + warfarin.  Prolonged lovenox use is not recommended in ESRD d/t risk of accumulation and variable kinetics. This was discussed with Dr. Wilson Singer however the patient is not to be admitted and will still require bridging. Will continue with lovenox as ordered - however the patient will need close monitoring for risk of accumulation (can draw levels at HD center). Wt: 74.8 kg, new ESRD-TTS, Warf points~7, baseline INR 1.08  The patient will need a minimum of 5 days overlap per the CHEST guidelines. If the patient remains on lovenox for >1 week, would recommend collecting an anti-Xa (lovenox) level and adjusting the dose as needed at that time.   Goal of Therapy:  INR 2-3 Anti-Xa level 0.6-1 units/ml 4hrs after LMWH dose given Monitor platelets by anticoagulation  protocol: Yes   Plan:  1. Lovenox 70 mg SQ every 24 hours (rounding down to help with risk of accumulation) 2. Warfarin 7.5 mg x 1 dose now 3. Upon discharge - would recommend 5 mg daily until PT/INR check at the HD center on Tues, 7/7 4. Noted plans to discharge from the [facility] - would recommend that the patient get an anti-Xa drawn and monitored if he remains on lovenox for >1 week.  5. The patient was educated prior to discharge  Alycia Rossetti, PharmD, BCPS Clinical Pharmacist Pager: [phone] 04/20/2014 12:25 PM

## 2014-04-20 NOTE — Discharge Instructions (Signed)
Deep Vein Thrombosis °A deep vein thrombosis (DVT) is a blood clot that develops in the deep, larger veins of the leg, arm, or pelvis. These are more dangerous than clots that might form in veins near the surface of the body. A DVT can lead to complications if the clot breaks off and travels in the bloodstream to the lungs.  °A DVT can damage the valves in your leg veins, so that instead of flowing upward, the blood pools in the lower leg. This is called post-thrombotic syndrome, and it can result in pain, swelling, discoloration, and sores on the leg. °CAUSES °Usually, several things contribute to blood clots forming. Contributing factors include: °· The flow of blood slows down. °· The inside of the vein is damaged in some way. °· You have a condition that makes blood clot more easily. °RISK FACTORS °Some people are more likely than others to develop blood clots. Risk factors include:  °· Older age, especially over 75 years of age. °· Having a family history of blood clots or if you have already had a blot clot. °· Having major or lengthy surgery. This is especially true for surgery on the hip, knee, or belly (abdomen). Hip surgery is particularly high risk. °· Breaking a hip or leg. °· Sitting or lying still for a long time. This includes long-distance travel, paralysis, or recovery from an illness or surgery. °· Having cancer or cancer treatment. °· Having a long, thin tube (catheter) placed inside a vein during a medical procedure. °· Being overweight (obese). °· Pregnancy and childbirth. °¨ Hormone changes make the blood clot more easily during pregnancy. °¨ The fetus puts pressure on the veins of the pelvis. °¨ There is a risk of injury to veins during delivery or a caesarean. The risk is highest just after childbirth. °· Medicines with the male hormone estrogen. This includes birth control pills and hormone replacement therapy. °· Smoking. °· Other circulation or heart problems. ° °SIGNS AND SYMPTOMS °When  a clot forms, it can either partially or totally block the blood flow in that vein. Symptoms of a DVT can include: °· Swelling of the leg or arm, especially if one side is much worse. °· Warmth and redness of the leg or arm, especially if one side is much worse. °· Pain in an arm or leg. If the clot is in the leg, symptoms may be more noticeable or worse when standing or walking. °The symptoms of a DVT that has traveled to the lungs (pulmonary embolism, PE) usually start suddenly and include: °· Shortness of breath. °· Coughing. °· Coughing up blood or blood-tinged phlegm. °· Chest pain. The chest pain is often worse with deep breaths. °· Rapid heartbeat. °Anyone with these symptoms should get emergency medical treatment right away. Call your local emergency services (911 in the [place] if you have these symptoms. °DIAGNOSIS °If a DVT is suspected, your health care provider will take a full medical history and perform a physical exam. Tests that also may be required include: °· Blood tests, including studies of the clotting properties of the blood. °· Ultrasonography to see if you have clots in your legs or lungs. °· X-rays to show the flow of blood when dye is injected into the veins (venography). °· Studies of your lungs if you have any chest symptoms. °PREVENTION °· Exercise the legs regularly. Take a brisk 30-minute walk every day. °· Maintain a weight that is appropriate for your height. °· Avoid sitting or lying in bed   for long periods of time without moving your legs. °· Women, particularly those over the age of 35 years, should consider the risks and benefits of taking estrogen medicines, including birth control pills. °· Do not smoke, especially if you take estrogen medicines. °· Long-distance travel can increase your risk of DVT. You should exercise your legs by walking or pumping the muscles every hour. °· In-hospital prevention: °¨ Many of the risk factors above relate to situations that exist with  hospitalization, either for illness, injury, or elective surgery. °¨ Your health care provider will assess you for the need for venous thromboembolism prophylaxis when you are admitted to the hospital. If you are having surgery, your surgeon will assess you the day of or day after surgery. °¨ Prevention may include medical and nonmedical measures. °TREATMENT °Once identified, a DVT can be treated. It can also be prevented in some circumstances. Once you have had a DVT, you may be at increased risk for a DVT in the future. The most common treatment for DVT is blood thinning (anticoagulant) medicine, which reduces the blood's tendency to clot. Anticoagulants can stop new blood clots from forming and stop old ones from growing. They cannot dissolve existing clots. Your body does this by itself over time. Anticoagulants can be given by mouth, by IV access, or by injection. Your health care provider will determine the best program for you. Other medicines or treatments that may be used are: °· Heparin or related medicines (low molecular weight heparin) are usually the first treatment for a blood clot. They act quickly. However, they cannot be taken orally. °¨ Heparin can cause a fall in a component of blood that stops bleeding and forms blood clots (platelets). You will be monitored with blood tests to be sure this does not occur. °· Warfarin is an anticoagulant that can be swallowed. It takes a few days to start working, so usually heparin or related medicines are used in combination. Once warfarin is working, heparin is usually stopped. °· Less commonly, clot dissolving drugs (thrombolytics) are used to dissolve a DVT. They carry a high risk of bleeding, so they are used mainly in severe cases, where your life or a limb is threatened. °· Very rarely, a blood clot in the leg needs to be removed surgically. °· If you are unable to take anticoagulants, your health care provider may arrange for you to have a filter placed  in a main vein in your abdomen. This filter prevents clots from traveling to your lungs. °HOME CARE INSTRUCTIONS °· Take all medicines prescribed by your health care provider. Only take over-the-counter or prescription medicines for pain, fever, or discomfort as directed by your health care provider. °· Warfarin. Most people will continue taking warfarin after hospital discharge. Your health care provider will advise you on the length of treatment (usually 3-6 months, sometimes lifelong). °¨ Too much and too little warfarin are both dangerous. Too much warfarin increases the risk of bleeding. Too little warfarin continues to allow the risk for blood clots. While taking warfarin, you will need to have regular blood tests to measure your blood clotting time. These blood tests usually include both the prothrombin time (PT) and international normalized ratio (INR) tests. The PT and INR results allow your health care provider to adjust your dose of warfarin. The dose can change for many reasons. It is critically important that you take warfarin exactly as prescribed, and that you have your PT and INR levels drawn exactly as directed. °¨   Many foods, especially foods high in vitamin K, can interfere with warfarin and affect the PT and INR results. Foods high in vitamin K include spinach, kale, broccoli, cabbage, collard and turnip greens, brussel sprouts, peas, cauliflower, seaweed, and parsley as well as beef and pork liver, green tea, and soybean oil. You should eat a consistent amount of foods high in vitamin K. Avoid major changes in your diet, or notify your health care provider before changing your diet. Arrange a visit with a dietitian to answer your questions. °¨ Many medicines can interfere with warfarin and affect the PT and INR results. You must tell your health care provider about any and all medicines you take. This includes all vitamins and supplements. Be especially cautious with aspirin and  anti-inflammatory medicines. Ask your health care provider before taking these. Do not take or discontinue any prescribed or over-the-counter medicine except on the advice of your health care provider or pharmacist. °¨ Warfarin can have side effects, primarily excessive bruising or bleeding. You will need to hold pressure over cuts for longer than usual. Your health care provider or pharmacist will discuss other potential side effects. °¨ Alcohol can change the body's ability to handle warfarin. It is best to avoid alcoholic drinks or consume only very small amounts while taking warfarin. Notify your health care provider if you change your alcohol intake. °¨ Notify your dentist or other health care providers before procedures. °· Activity. Ask your health care provider how soon you can go back to normal activities. It is important to stay active to prevent blood clots. If you are on anticoagulant medicine, avoid contact sports. °· Exercise. It is very important to exercise. This is especially important while traveling, sitting, or standing for long periods of time. Exercise your legs by walking or by pumping the muscles frequently. Take frequent walks. °· Compression stockings. These are tight elastic stockings that apply pressure to the lower legs. This pressure can help keep the blood in the legs from clotting. You may need to wear compression stockings at home to help prevent a DVT. °· Do not smoke. If you smoke, quit. Ask your health care provider for help with quitting smoking. °· Learn as much as you can about DVT. Knowing more about the condition should help you keep it from coming back. °· Wear a medical alert bracelet or carry a medical alert card. °SEEK MEDICAL CARE IF: °· You notice a rapid heartbeat. °· You feel weaker or more tired than usual. °· You feel faint. °· You notice increased bruising. °· You feel your symptoms are not getting better in the time expected. °· You believe you are having side  effects of medicine. °SEEK IMMEDIATE MEDICAL CARE IF: °· You have chest pain. °· You have trouble breathing. °· You have new or increased swelling or pain in one leg. °· You cough up blood. °· You notice blood in vomit, in a bowel movement, or in urine. °MAKE SURE YOU: °· Understand these instructions. °· Will watch your condition. °· Will get help right away if you are not doing well or get worse. °Document Released: 10/05/2005 Document Revised: 07/26/2013 Document Reviewed: 06/12/2013 °ExitCare® Patient Information ©2015 [employer]. This information is not intended to replace advice given to you by your health care provider. Make sure you discuss any questions you have with your health care provider. ° °

## 2014-04-20 NOTE — ED Notes (Signed)
MD at bedside.Kohut  

## 2014-04-20 NOTE — ED Notes (Signed)
PT family member came out to ask when MD would be back -told her i'd work on finding him

## 2014-04-20 NOTE — ED Notes (Addendum)
Per pt sts LLE pain and swelling 2 weeks ago. Pt had doppler this am. Last dialysis yesterday. sts was told positive for DVT

## 2014-04-20 NOTE — Progress Notes (Addendum)
VASCULAR LAB PRELIMINARY  PRELIMINARY  PRELIMINARY  PRELIMINARY  Bilateral lower extremity venous duplex completed.    Preliminary report:  Right - No evidence of deep vein thrombosis, superficial thrombosis or Baker's cyst. Left - Positive for a partially thrombosed deep vein thrombus at the valve in the mid to distal femoral vein. Soft thrombus. Vein is extremely dilated at the site. Positive for acute deep vein thrombosis in the posterior tibial vein. Localized mid calf area.There is no evidence of a superfical thrombus or Baker's cyst.  SLAUGHTER, [place], RVS 04/20/2014, 11:32 AM

## 2014-04-20 NOTE — ED Provider Notes (Signed)
CSN: [id number]     Arrival date & time 04/20/14  1023 History   First MD Initiated Contact with Patient 04/20/14 1033     Chief Complaint  Patient presents with  . DVT     (Consider location/radiation/quality/duration/timing/severity/associated sxs/prior Treatment) HPI  45yM presenting from vascular center after having [place] positive for DVT. Reports about 2 weeks of LLE pain and swelling. No trauma. No CP, cough or SOB.  No fever or chills. Denies prior hx of blood clot. ESRD on dialysis and was last dialyzed yesterday.   Past Medical History  Diagnosis Date  . Chronic kidney disease   . Polycystic kidney disease   . Leucocytosis   . Hypertension   . Anemia   . Shortness of breath    Past Surgical History  Procedure Laterality Date  . Nose surgery    . Central venous catheter insertion  04/02/2014    DR Titus Mould  . Av fistula placement Left 04/05/2014    Procedure: CREATION OF ARTERIOVENOUS (AV) FISTULA ;  Surgeon: Angelia Mould, MD;  Location: [place];  Service: Vascular;  Laterality: Left;  . Insertion of dialysis catheter Right 04/05/2014    Procedure: ULTRASOUND GUIDED INSERTION OF DIALYSIS CATHETER;  Surgeon: Angelia Mould, MD;  Location: [facility] OR;  Service: Vascular;  Laterality: Right;   Family History  Problem Relation Age of Onset  . Polycystic kidney disease Father   . Polycystic kidney disease Sister    History  Substance Use Topics  . Smoking status: Former Research scientist (life sciences)  . Smokeless tobacco: Never Used     Comment: QUIT SMOKING CIGARETTES  IN 1994      QUIT SMOKING POT   . Alcohol Use: No    Review of Systems  All systems reviewed and negative, other than as noted in HPI.   Allergies  Review of patient's allergies indicates no known allergies.  Home Medications   Prior to Admission medications   Medication Sig Start Date End Date Taking? Authorizing Provider  calcium acetate (PHOSLO) 667 MG capsule Take 2 capsules (1,334 mg total) by mouth 3  (three) times daily with meals. 04/10/14  Yes Ramiro Harvest, PA-C  cetirizine (ZYRTEC) 10 MG tablet Take 10 mg by mouth daily as needed for allergies.   Yes Historical Provider, MD  feeding supplement, ENSURE COMPLETE, (ENSURE COMPLETE) LIQD Take 237 mLs by mouth 2 (two) times daily between meals. 04/10/14  Yes Ramiro Harvest, PA-C  metoprolol tartrate (LOPRESSOR) 25 MG tablet Take 0.5 tablets (12.5 mg total) by mouth 2 (two) times daily. 04/10/14  Yes Ramiro Harvest, PA-C  multivitamin (RENA-VIT) TABS tablet Take 1 tablet by mouth every morning. 04/10/14  Yes Charles Lyles, PA-C   BP 101/62  Pulse 82  Temp(Src) 97.3 F (36.3 C) (Oral)  Resp 17  SpO2 96% Physical Exam  Nursing note and vitals reviewed. Constitutional: He appears well-developed and well-nourished. No distress.  HENT:  Head: Normocephalic and atraumatic.  Eyes: Conjunctivae are normal. Right eye exhibits no discharge. Left eye exhibits no discharge.  Neck: Neck supple.  Cardiovascular: Normal rate, regular rhythm and normal heart sounds.  Exam reveals no gallop and no friction rub.   No murmur heard. Pulmonary/Chest: Effort normal and breath sounds normal. No respiratory distress.  Abdominal: Soft. He exhibits no distension. There is no tenderness.  Musculoskeletal: He exhibits edema. He exhibits no tenderness.  Mild swelling, erythema of LLE. No calf tendernes. Neg Homans.   Neurological: He is alert.  Skin: Skin is warm  and dry.  Psychiatric: He has a normal mood and affect. His behavior is normal. Thought content normal.    ED Course  Procedures (including critical care time) Labs Review Labs Reviewed - No data to display  Imaging Review No results found.   EKG Interpretation None      MDM   Final diagnoses:  DVT (deep venous thrombosis), left    11:26 AM No [place] report in EPIC yet. Called vascular center. RLE neg. LLE with acute DVT in femoral vein. Left radial cephalic AV fistula [id number]. Discussed with Dr  Bridgett Larsson and he's fine with blood thinners.   Reduced lovenox with dialysis per pharmacy. INR checks at dialysis. If remains on lovenox beyond week, needs anti-Xa checked.    Virgel Manifold, MD 05/01/14 [phone]

## 2014-05-22 ENCOUNTER — Encounter: Payer: Self-pay | Admitting: Vascular Surgery

## 2014-05-23 ENCOUNTER — Encounter: Payer: Self-pay | Admitting: Vascular Surgery

## 2014-05-23 ENCOUNTER — Ambulatory Visit (INDEPENDENT_AMBULATORY_CARE_PROVIDER_SITE_OTHER): Payer: Self-pay | Admitting: Vascular Surgery

## 2014-05-23 ENCOUNTER — Ambulatory Visit (HOSPITAL_COMMUNITY)
Admit: 2014-05-23 | Discharge: 2014-05-23 | Disposition: A | Discharge status: Home / Self Care | Payer: Self-pay | Source: Ambulatory Visit | Attending: Vascular Surgery | Admitting: Vascular Surgery

## 2014-05-23 VITALS — BP 100/86 | HR 57 | Ht 68.9 in | Wt 166.3 lb

## 2014-05-23 DIAGNOSIS — Z4931 Encounter for adequacy testing for hemodialysis: Secondary | ICD-10-CM | POA: Insufficient documentation

## 2014-05-23 DIAGNOSIS — N186 End stage renal disease: Secondary | ICD-10-CM | POA: Insufficient documentation

## 2014-05-23 NOTE — Progress Notes (Signed)
   Patient name: [name] [name] MRN: [id number] DOB: 18-Jul-1961 Sex: male  REASON FOR VISIT: Follow up after left radiocephalic AV fistula  HPI: [name] [name] is a 53 y.o. male who had a tunneled dialysis catheter and left radiocephalic AV fistula placed on 04/05/2014. He presents for a 6 week follow up visit. He has no specific complaints. He denies pain or paresthesias in his left upper extremity.  REVIEW OF SYSTEMS: Valu.Nieves ] denotes positive finding; [  ] denotes negative finding  CARDIOVASCULAR:  [ ]  chest pain   [ ]  dyspnea on exertion    CONSTITUTIONAL:  [ ]  fever   [ ]  chills  PHYSICAL EXAM: Filed Vitals:   05/23/14 1331  BP: 100/86  Pulse: 57  Height: 5' 8.9" (1.75 m)  Weight: 166 lb 4.8 oz (75.433 kg)  SpO2: 100%   Body mass index is 24.63 kg/(m^2). GENERAL: The patient is a well-nourished male, in no acute distress. The vital signs are documented above. CARDIOVASCULAR: There is a regular rate and rhythm. PULMONARY: There is good air exchange bilaterally without wheezing or rales. His fistula has an excellent thrill and appears to be maturing adequately. He has a palpable left radial pulse. The cephalic vein does branch in the mid upper arm into a cephalic branch and the basilic branch. However currently I do not see any advantage to ligating the cephalic branch which is the smaller of the 2 as this does not appear to be compromising maturation of the fistula beyond this branch.  DUPLEX: I have reviewed his duplex of his fistula. The diameters range from 0.66-0.52 cm. The cephalic vein branches into a cephalic branch and a basilic branch in the mid forearm.  MEDICAL ISSUES:  End stage renal disease His left radiocephalic AV fistula appears to be maturing adequately. I think that it could be used beginning in mid September. We'll be happy to see him back at any time if there are any problems with the fistula.   [facility]  [place] Beeper: [phone]

## 2014-05-23 NOTE — Assessment & Plan Note (Signed)
His left radiocephalic AV fistula appears to be maturing adequately. I think that it could be used beginning in mid September. We'll be happy to see him back at any time if there are any problems with the fistula.

## 2014-07-31 ENCOUNTER — Other Ambulatory Visit (HOSPITAL_COMMUNITY): Payer: Self-pay | Admitting: Nephrology

## 2014-07-31 DIAGNOSIS — M25562 Pain in left knee: Secondary | ICD-10-CM

## 2014-07-31 DIAGNOSIS — I82409 Acute embolism and thrombosis of unspecified deep veins of unspecified lower extremity: Secondary | ICD-10-CM

## 2014-08-01 ENCOUNTER — Ambulatory Visit (HOSPITAL_COMMUNITY)
Admission: RE | Admit: 2014-08-01 | Discharge: 2014-08-01 | Disposition: A | Discharge status: Home / Self Care | Payer: Medicare Other | Source: Ambulatory Visit | Attending: Nephrology | Admitting: Nephrology

## 2014-08-01 DIAGNOSIS — M25562 Pain in left knee: Secondary | ICD-10-CM | POA: Insufficient documentation

## 2014-08-01 DIAGNOSIS — I82409 Acute embolism and thrombosis of unspecified deep veins of unspecified lower extremity: Secondary | ICD-10-CM | POA: Diagnosis present

## 2014-08-01 DIAGNOSIS — Z86718 Personal history of other venous thrombosis and embolism: Secondary | ICD-10-CM

## 2014-08-01 NOTE — Progress Notes (Signed)
*  PRELIMINARY RESULTS* Vascular Ultrasound Left lower extremity venous duplex has been completed.  Preliminary findings: no evidence of DVT. Previous DVT appears to have resolved.   Called results to Dr. Lorrene Reid.  Landry Mellow, RDMS, RVT  08/01/2014, 10:17 AM

## 2016-06-02 ENCOUNTER — Other Ambulatory Visit: Payer: Self-pay | Admitting: Nephrology

## 2018-08-08 ENCOUNTER — Encounter (HOSPITAL_COMMUNITY)
Admission: RE | Admit: 2018-08-08 | Discharge: 2018-08-08 | Disposition: A | Discharge status: Home / Self Care | Payer: Medicare Other | Source: Ambulatory Visit | Attending: Nephrology | Admitting: Nephrology

## 2018-08-08 ENCOUNTER — Inpatient Hospital Stay (HOSPITAL_COMMUNITY): Admission: RE | Admit: 2018-08-08 | Payer: Medicare Other | Source: Ambulatory Visit

## 2018-08-08 ENCOUNTER — Encounter (HOSPITAL_COMMUNITY): Payer: Self-pay

## 2018-08-08 DIAGNOSIS — D751 Secondary polycythemia: Secondary | ICD-10-CM | POA: Diagnosis not present

## 2018-08-08 NOTE — Discharge Instructions (Signed)

## 2018-08-08 NOTE — Progress Notes (Signed)
Verified how may ml of blood to withdraw. 500 ml to be taken.

## 2018-08-08 NOTE — Progress Notes (Signed)
[name] [name] presents today for phlebotomy per MD orders.  Phlebotomy procedure started at 11:15 AM and ended at 11:30AM. 500 cc removed. Patient tolerated procedure well. IV needle removed intact.

## 2018-08-09 ENCOUNTER — Other Ambulatory Visit: Payer: Self-pay | Admitting: Nephrology

## 2018-08-09 DIAGNOSIS — D751 Secondary polycythemia: Secondary | ICD-10-CM

## 2018-08-12 ENCOUNTER — Ambulatory Visit
Admission: RE | Admit: 2018-08-12 | Discharge: 2018-08-12 | Disposition: A | Discharge status: Home / Self Care | Payer: Medicare Other | Source: Ambulatory Visit | Attending: Nephrology | Admitting: Nephrology

## 2018-08-12 DIAGNOSIS — D751 Secondary polycythemia: Secondary | ICD-10-CM

## 2018-08-12 MED ORDER — IOPAMIDOL (ISOVUE-300) INJECTION 61%
100.0000 mL | Freq: Once | INTRAVENOUS | Status: AC | PRN
  Administered 2018-08-12: 100 mL via INTRAVENOUS

## 2018-09-14 ENCOUNTER — Observation Stay (HOSPITAL_COMMUNITY): Payer: Medicare Other

## 2018-09-14 ENCOUNTER — Encounter (HOSPITAL_COMMUNITY): Payer: Self-pay | Admitting: Emergency Medicine

## 2018-09-14 ENCOUNTER — Other Ambulatory Visit: Payer: Self-pay

## 2018-09-14 ENCOUNTER — Inpatient Hospital Stay (HOSPITAL_COMMUNITY)
Admission: EM | Admit: 2018-09-14 | Discharge: 2018-09-16 | DRG: 871 | Disposition: A | Discharge status: Home / Self Care | Payer: Medicare Other | Attending: Family Medicine | Admitting: Family Medicine

## 2018-09-14 ENCOUNTER — Emergency Department (HOSPITAL_COMMUNITY): Payer: Medicare Other

## 2018-09-14 DIAGNOSIS — Z8271 Family history of polycystic kidney: Secondary | ICD-10-CM

## 2018-09-14 DIAGNOSIS — R8281 Pyuria: Secondary | ICD-10-CM | POA: Diagnosis present

## 2018-09-14 DIAGNOSIS — Z881 Allergy status to other antibiotic agents status: Secondary | ICD-10-CM

## 2018-09-14 DIAGNOSIS — Z79899 Other long term (current) drug therapy: Secondary | ICD-10-CM

## 2018-09-14 DIAGNOSIS — Z79890 Hormone replacement therapy (postmenopausal): Secondary | ICD-10-CM

## 2018-09-14 DIAGNOSIS — J0191 Acute recurrent sinusitis, unspecified: Secondary | ICD-10-CM | POA: Diagnosis not present

## 2018-09-14 DIAGNOSIS — Z87891 Personal history of nicotine dependence: Secondary | ICD-10-CM

## 2018-09-14 DIAGNOSIS — Z86718 Personal history of other venous thrombosis and embolism: Secondary | ICD-10-CM

## 2018-09-14 DIAGNOSIS — J302 Other seasonal allergic rhinitis: Secondary | ICD-10-CM | POA: Diagnosis present

## 2018-09-14 DIAGNOSIS — A419 Sepsis, unspecified organism: Secondary | ICD-10-CM | POA: Diagnosis present

## 2018-09-14 DIAGNOSIS — E8889 Other specified metabolic disorders: Secondary | ICD-10-CM | POA: Diagnosis present

## 2018-09-14 DIAGNOSIS — Z885 Allergy status to narcotic agent status: Secondary | ICD-10-CM

## 2018-09-14 DIAGNOSIS — R509 Fever, unspecified: Secondary | ICD-10-CM | POA: Diagnosis not present

## 2018-09-14 DIAGNOSIS — N2581 Secondary hyperparathyroidism of renal origin: Secondary | ICD-10-CM | POA: Diagnosis present

## 2018-09-14 DIAGNOSIS — J019 Acute sinusitis, unspecified: Secondary | ICD-10-CM | POA: Diagnosis present

## 2018-09-14 DIAGNOSIS — I120 Hypertensive chronic kidney disease with stage 5 chronic kidney disease or end stage renal disease: Secondary | ICD-10-CM | POA: Diagnosis present

## 2018-09-14 DIAGNOSIS — N186 End stage renal disease: Secondary | ICD-10-CM | POA: Diagnosis not present

## 2018-09-14 DIAGNOSIS — D631 Anemia in chronic kidney disease: Secondary | ICD-10-CM | POA: Diagnosis present

## 2018-09-14 DIAGNOSIS — Z992 Dependence on renal dialysis: Secondary | ICD-10-CM

## 2018-09-14 DIAGNOSIS — D473 Essential (hemorrhagic) thrombocythemia: Secondary | ICD-10-CM | POA: Diagnosis present

## 2018-09-14 DIAGNOSIS — J189 Pneumonia, unspecified organism: Secondary | ICD-10-CM | POA: Diagnosis present

## 2018-09-14 DIAGNOSIS — I10 Essential (primary) hypertension: Secondary | ICD-10-CM | POA: Diagnosis present

## 2018-09-14 DIAGNOSIS — Z8249 Family history of ischemic heart disease and other diseases of the circulatory system: Secondary | ICD-10-CM

## 2018-09-14 DIAGNOSIS — R0902 Hypoxemia: Secondary | ICD-10-CM | POA: Diagnosis present

## 2018-09-14 DIAGNOSIS — R1310 Dysphagia, unspecified: Secondary | ICD-10-CM | POA: Diagnosis present

## 2018-09-14 DIAGNOSIS — Q613 Polycystic kidney, unspecified: Secondary | ICD-10-CM

## 2018-09-14 LAB — COMPREHENSIVE METABOLIC PANEL
ALK PHOS: 93 U/L (ref 38–126)
ALT: 21 U/L (ref 0–44)
AST: 22 U/L (ref 15–41)
Albumin: 3.7 g/dL (ref 3.5–5.0)
Anion gap: 13 (ref 5–15)
BILIRUBIN TOTAL: 1 mg/dL (ref 0.3–1.2)
BUN: 16 mg/dL (ref 6–20)
CALCIUM: 9.2 mg/dL (ref 8.9–10.3)
CO2: 29 mmol/L (ref 22–32)
CREATININE: 6.85 mg/dL — AB (ref 0.61–1.24)
Chloride: 94 mmol/L — ABNORMAL LOW (ref 98–111)
GFR calc Af Amer: 10 mL/min — ABNORMAL LOW (ref >60)
GFR, EST NON AFRICAN AMERICAN: 8 mL/min — AB (ref >60)
Glucose, Bld: 98 mg/dL (ref 70–99)
Potassium: 3.9 mmol/L (ref 3.5–5.1)
Sodium: 136 mmol/L (ref 135–145)
TOTAL PROTEIN: 7.3 g/dL (ref 6.5–8.1)

## 2018-09-14 LAB — I-STAT CG4 LACTIC ACID, ED
Lactic Acid, Venous: 2.18 mmol/L (ref 0.5–1.9)
Lactic Acid, Venous: 1.17 mmol/L (ref 0.5–1.9)

## 2018-09-14 LAB — CBC WITH DIFFERENTIAL/PLATELET
Abs Immature Granulocytes: 0.07 10*3/uL (ref 0.00–0.07)
BASOS PCT: 0 %
Basophils Absolute: 0 10*3/uL (ref 0.0–0.1)
EOS ABS: 0.1 10*3/uL (ref 0.0–0.5)
EOS PCT: 1 %
HCT: 52.2 % — ABNORMAL HIGH (ref 39.0–52.0)
HEMOGLOBIN: 16.8 g/dL (ref 13.0–17.0)
Immature Granulocytes: 1 %
LYMPHS PCT: 3 %
Lymphs Abs: 0.4 10*3/uL — ABNORMAL LOW (ref 0.7–4.0)
MCH: 29.7 pg (ref 26.0–34.0)
MCHC: 32.2 g/dL (ref 30.0–36.0)
MCV: 92.2 fL (ref 80.0–100.0)
MONO ABS: 0.8 10*3/uL (ref 0.1–1.0)
Monocytes Relative: 7 %
Neutro Abs: 10.3 10*3/uL — ABNORMAL HIGH (ref 1.7–7.7)
Neutrophils Relative %: 88 %
Platelets: 179 10*3/uL (ref 150–400)
RBC: 5.66 MIL/uL (ref 4.22–5.81)
RDW: 12.7 % (ref 11.5–15.5)
WBC: 11.6 10*3/uL — AB (ref 4.0–10.5)
nRBC: 0 % (ref 0.0–0.2)

## 2018-09-14 LAB — URINALYSIS, ROUTINE W REFLEX MICROSCOPIC
Bilirubin Urine: NEGATIVE
Glucose, UA: NEGATIVE mg/dL
Ketones, ur: NEGATIVE mg/dL
Nitrite: NEGATIVE
Protein, ur: 30 mg/dL — AB
SPECIFIC GRAVITY, URINE: 1.01 (ref 1.005–1.030)
pH: 8 (ref 5.0–8.0)

## 2018-09-14 LAB — MRSA PCR SCREENING: MRSA by PCR: NEGATIVE

## 2018-09-14 LAB — CREATININE, SERUM
Creatinine, Ser: 7.37 mg/dL — ABNORMAL HIGH (ref 0.61–1.24)
GFR calc Af Amer: 9 mL/min — ABNORMAL LOW (ref >60)
GFR, EST NON AFRICAN AMERICAN: 8 mL/min — AB (ref >60)

## 2018-09-14 MED ORDER — SODIUM CHLORIDE 0.9 % IV BOLUS
250.0000 mL | Freq: Once | INTRAVENOUS | Status: AC
  Administered 2018-09-14: 250 mL via INTRAVENOUS

## 2018-09-14 MED ORDER — SODIUM CHLORIDE 0.9 % IV BOLUS
1000.0000 mL | Freq: Once | INTRAVENOUS | Status: AC
  Administered 2018-09-14: 1000 mL via INTRAVENOUS

## 2018-09-14 MED ORDER — IOPAMIDOL (ISOVUE-370) INJECTION 76%
100.0000 mL | Freq: Once | INTRAVENOUS | Status: AC | PRN
  Administered 2018-09-14: 63 mL via INTRAVENOUS

## 2018-09-14 MED ORDER — IPRATROPIUM-ALBUTEROL 0.5-2.5 (3) MG/3ML IN SOLN: 3.0000 mL | Freq: Four times a day (QID) | RESPIRATORY_TRACT | Status: DC | PRN

## 2018-09-14 MED ORDER — FERRIC CITRATE 1 GM 210 MG(FE) PO TABS
630.0000 mg | ORAL_TABLET | Freq: Three times a day (TID) | ORAL | Status: DC
  Administered 2018-09-15 – 2018-09-16 (×5): 630 mg via ORAL
  Filled 2018-09-14 (×8): qty 3

## 2018-09-14 MED ORDER — ENSURE ENLIVE PO LIQD: 237.0000 mL | Freq: Two times a day (BID) | ORAL | Status: DC

## 2018-09-14 MED ORDER — LEVOFLOXACIN IN D5W 500 MG/100ML IV SOLN: 500.0000 mg | INTRAVENOUS | Status: DC

## 2018-09-14 MED ORDER — ENOXAPARIN SODIUM 30 MG/0.3ML ~~LOC~~ SOLN
30.0000 mg | SUBCUTANEOUS | Status: DC
  Administered 2018-09-14 – 2018-09-15 (×2): 30 mg via SUBCUTANEOUS
  Filled 2018-09-14 (×2): qty 0.3

## 2018-09-14 MED ORDER — SODIUM CHLORIDE 0.9 % IV SOLN: 1.5000 g | Freq: Two times a day (BID) | INTRAVENOUS | Status: DC

## 2018-09-14 MED ORDER — WARFARIN SODIUM 5 MG PO TABS: 5.0000 mg | ORAL_TABLET | Freq: Every day | ORAL | Status: DC

## 2018-09-14 MED ORDER — SODIUM CHLORIDE 0.9 % IV SOLN: 1.5000 g | Freq: Three times a day (TID) | INTRAVENOUS | Status: DC

## 2018-09-14 MED ORDER — CALCIUM ACETATE (PHOS BINDER) 667 MG PO CAPS
1334.0000 mg | ORAL_CAPSULE | Freq: Three times a day (TID) | ORAL | Status: DC
  Filled 2018-09-14: qty 2

## 2018-09-14 MED ORDER — METOPROLOL TARTRATE 25 MG PO TABS
12.5000 mg | ORAL_TABLET | Freq: Two times a day (BID) | ORAL | Status: DC
  Administered 2018-09-14 – 2018-09-16 (×4): 12.5 mg via ORAL
  Filled 2018-09-14 (×4): qty 1

## 2018-09-14 MED ORDER — ACETAMINOPHEN 325 MG PO TABS
650.0000 mg | ORAL_TABLET | Freq: Once | ORAL | Status: AC
  Administered 2018-09-14: 650 mg via ORAL
  Filled 2018-09-14: qty 2

## 2018-09-14 MED ORDER — LORATADINE 10 MG PO TABS
10.0000 mg | ORAL_TABLET | Freq: Every day | ORAL | Status: DC
  Administered 2018-09-15 – 2018-09-16 (×2): 10 mg via ORAL
  Filled 2018-09-14 (×2): qty 1

## 2018-09-14 MED ORDER — LEVOFLOXACIN IN D5W 750 MG/150ML IV SOLN
750.0000 mg | Freq: Once | INTRAVENOUS | Status: AC
  Administered 2018-09-14: 750 mg via INTRAVENOUS
  Filled 2018-09-14: qty 150

## 2018-09-14 MED ORDER — SODIUM CHLORIDE 0.9 % IV SOLN: INTRAVENOUS | Status: DC

## 2018-09-14 MED ORDER — CINACALCET HCL 30 MG PO TABS
30.0000 mg | ORAL_TABLET | Freq: Every day | ORAL | Status: DC
  Administered 2018-09-15: 30 mg via ORAL
  Filled 2018-09-14: qty 1

## 2018-09-14 MED ORDER — PANTOPRAZOLE SODIUM 40 MG PO TBEC
40.0000 mg | DELAYED_RELEASE_TABLET | Freq: Every day | ORAL | Status: DC
  Administered 2018-09-15 – 2018-09-16 (×2): 40 mg via ORAL
  Filled 2018-09-14 (×2): qty 1

## 2018-09-14 MED ORDER — FLUTICASONE PROPIONATE 50 MCG/ACT NA SUSP
2.0000 | Freq: Every day | NASAL | Status: DC
  Administered 2018-09-16: 2 via NASAL
  Filled 2018-09-14: qty 16

## 2018-09-14 MED ORDER — RENA-VITE PO TABS
1.0000 | ORAL_TABLET | Freq: Every day | ORAL | Status: DC
  Administered 2018-09-14 – 2018-09-15 (×2): 1 via ORAL
  Filled 2018-09-14 (×2): qty 1

## 2018-09-14 NOTE — Progress Notes (Signed)
Pharmacy Antibiotic Note  [name] [name] is a 57 y.o. male admitted on 09/14/2018 with pneumonia.  Pharmacy has been consulted for levaquin dosing.  ESRD-HD pt TTS.    Plan: Levaquin 750mg  IV x 1, then 500mg  IV every 48 hours F/u LOT, clinical progression  Height: 5\' 9"  (175.3 cm) Weight: 198 lb 6.6 oz (90 kg) IBW/kg (Calculated) : 70.7  Temp (24hrs), Avg:98.3 F (36.8 C), Min:98.3 F (36.8 C), Max:98.3 F (36.8 C)  No results for input(s): WBC, CREATININE, LATICACIDVEN, VANCOTROUGH, VANCOPEAK, VANCORANDOM, GENTTROUGH, GENTPEAK, GENTRANDOM, TOBRATROUGH, TOBRAPEAK, TOBRARND, AMIKACINPEAK, AMIKACINTROU, AMIKACIN in the last 168 hours.  CrCl cannot be calculated (Patient's most recent lab result is older than the maximum 21 days allowed.).    Allergies  Allergen Reactions  . Fentanyl Nausea Only  . Vancomycin Other (See Comments)    Red face    Antimicrobials this admission: Levaquin 11/27>>  Dose adjustments this admission: N/a  Microbiology results: 11/27 BCx: sent  Bertis Ruddy, PharmD Clinical Pharmacist Please check AMION for all [place] numbers 09/14/2018 12:36 PM

## 2018-09-14 NOTE — H&P (Addendum)
History and Physical    DOA: 09/14/2018  PCP: Patient, No Pcp Per  Patient coming from: Referred from dialysis unit  Chief Complaint: fever and chills  HPI: [name] [name] is a 57 y.o. male with history h/o hypertension, polycystic kidney disease, thrombocytosis requiring phlebotomy treatments, anemia, DVT is post anticoagulant treatment for 3 months who is on Tuesday Thursday Saturday dialysis schedule was sent from dialysis unit today due to fever and chills.  Patient states he has had seasonal allergies and that his sinus symptoms have gotten worse with postnasal drip and productive cough with yellow phlegm.  This morning he was feeling chills and in the dialysis center was noted to have temperature of 102F.  On arrival here he was tachycardic with heart rate 125 and rectal temp of 103F.  He denies feeling short of breath although he was somewhat hypoxic with O2 sat 90% on room air which improved on 2 L.   He states it is not unusual for him to have sinus symptom flare this time of the year and that he has chronic postnasal drip even at baseline.  He denies any diarrhea, abdominal pain although reports that postnasal drip causes him to spit up or sometimes vomit phlegm.  He is up-to-date with his flu shot.  Denies any sick contacts.  Denies any pain or swelling along AV fistula site.  Work-up in the ED revealed WBC 11.6, chest x-ray without infiltrates, lactate of 2.18 .  UA could not be sent as patient produces minimal urine.  And has received 2 L of IV fluids and Levaquin for presumed pneumonia  Review of Systems: As per HPI otherwise 10 point review of systems negative.    Past Medical History:  Diagnosis Date  . Anemia   . Chronic kidney disease   . DVT (deep venous thrombosis) ([place])   . Hypertension   . Leucocytosis   . Polycystic kidney disease   . Shortness of breath     Past Surgical History:  Procedure Laterality Date  . AV FISTULA PLACEMENT Left 04/05/2014   Procedure:  CREATION OF ARTERIOVENOUS (AV) FISTULA ;  Surgeon: Angelia Mould, MD;  Location: [facility];  Service: Vascular;  Laterality: Left;  . CENTRAL VENOUS CATHETER INSERTION  04/02/2014   DR Titus Mould  . INSERTION OF DIALYSIS CATHETER Right 04/05/2014   Procedure: ULTRASOUND GUIDED INSERTION OF DIALYSIS CATHETER;  Surgeon: Angelia Mould, MD;  Location: [facility];  Service: Vascular;  Laterality: Right;  . NOSE SURGERY      Social history:  reports that he has quit smoking. He has never used smokeless tobacco. He reports that he does not drink alcohol or use drugs.   Allergies  Allergen Reactions  . Fentanyl Nausea Only  . Vancomycin Other (See Comments)    Red face    Family History  Problem Relation Age of Onset  . Polycystic kidney disease Father   . Hypertension Father   . Polycystic kidney disease Sister       Prior to Admission medications   Medication Sig Start Date End Date Taking? Authorizing Provider  cetirizine (ZYRTEC) 10 MG tablet Take 10 mg by mouth daily as needed for allergies.   Yes [provider]  cinacalcet (SENSIPAR) 30 MG tablet Take 30 mg by mouth See admin instructions.    Yes [provider]  ferric citrate (AURYXIA) 1 GM 210 MG(Fe) tablet Take 630 mg by mouth 3 (three) times daily with meals.    Yes [provider]  fluticasone (FLONASE) 50 MCG/ACT nasal spray Place 2 sprays into both nostrils daily as needed for allergies. 12/15/17  Yes [provider]  multivitamin (RENA-VIT) TABS tablet Take 1 tablet by mouth every morning. 04/10/14  Yes Lyles, Charles, PA-C  pantoprazole (PROTONIX) 40 MG tablet Take 40 mg by mouth daily.   Yes [provider]  calcium acetate (PHOSLO) 667 MG capsule Take 2 capsules (1,334 mg total) by mouth 3 (three) times daily with meals. Patient not taking: Reported on 09/14/2018 04/10/14   Ramiro Harvest, PA-C  enoxaparin (LOVENOX) 30 MG/0.3ML injection Inject 0.3 mLs (30 mg total) into  the skin daily. Patient not taking: Reported on 09/14/2018 04/20/14   Virgel Manifold, MD  feeding supplement, ENSURE COMPLETE, (ENSURE COMPLETE) LIQD Take 237 mLs by mouth 2 (two) times daily between meals. Patient not taking: Reported on 09/14/2018 04/10/14   Ramiro Harvest, PA-C  metoprolol tartrate (LOPRESSOR) 25 MG tablet Take 0.5 tablets (12.5 mg total) by mouth 2 (two) times daily. Patient not taking: Reported on 09/14/2018 04/10/14   Ramiro Harvest, PA-C  warfarin (COUMADIN) 5 MG tablet Take 1 tablet (5 mg total) by mouth daily. Patient not taking: Reported on 09/14/2018 04/20/14   Virgel Manifold, MD    Physical Exam: Vitals:   09/14/18 1600 09/14/18 1615 09/14/18 1630 09/14/18 1637  BP: 96/67 (!) 80/56 (!) 84/59 (!) 87/56  Pulse: 93 83 85 80  Resp: (!) 21 (!) 21 (!) 24 14  Temp:      TempSrc:      SpO2: 97% 95% 96% 96%  Weight:      Height:        Constitutional: NAD, calm, comfortable Vitals:   09/14/18 1600 09/14/18 1615 09/14/18 1630 09/14/18 1637  BP: 96/67 (!) 80/56 (!) 84/59 (!) 87/56  Pulse: 93 83 85 80  Resp: (!) 21 (!) 21 (!) 24 14  Temp:      TempSrc:      SpO2: 97% 95% 96% 96%  Weight:      Height:       Eyes: PERRL, lids and conjunctivae normal ENMT: Mucous membranes are moist. Posterior pharynx erythematous with no pustules, no exudate or lesions.Normal dentition.  Neck: normal, supple, no masses, no thyromegaly Respiratory: clear to auscultation bilaterally, no wheezing, no crackles. Normal respiratory effort. No accessory muscle use.  Cardiovascular: Regular rate and rhythm, no murmurs / rubs / gallops. No extremity edema. 2+ pedal pulses. No carotid bruits.  Abdomen: no tenderness, no masses palpated. No hepatosplenomegaly. Bowel sounds positive.  Musculoskeletal: no clubbing / cyanosis. No joint deformity upper and lower extremities. Good ROM, no contractures. Normal muscle tone. AV fistula in LUE , no tenderness or redness Neurologic: CN 2-12 grossly  intact. Sensation intact, DTR normal. Strength 5/5 in all 4.  Psychiatric: Normal judgment and insight. Alert and oriented x 3. Normal mood.  SKIN/catheters: no rashes, lesions, ulcers. No induration  Labs on Admission: I have personally reviewed following labs and imaging studies  CBC: Recent Labs  Lab 09/14/18 1233  WBC 11.6*  NEUTROABS 10.3*  HGB 16.8  HCT 52.2*  MCV 92.2  PLT 242   Basic Metabolic Panel: Recent Labs  Lab 09/14/18 1233  NA 136  K 3.9  CL 94*  CO2 29  GLUCOSE 98  BUN 16  CREATININE 6.85*  CALCIUM 9.2   GFR: Estimated Creatinine Clearance: 13.5 mL/min (A) (by C-G formula based on SCr of 6.85 mg/dL (H)). Liver Function Tests: Recent Labs  Lab  09/14/18 1233  AST 22  ALT 21  ALKPHOS 93  BILITOT 1.0  PROT 7.3  ALBUMIN 3.7   No results for input(s): LIPASE, AMYLASE in the last 168 hours. No results for input(s): AMMONIA in the last 168 hours. Coagulation Profile: No results for input(s): INR, PROTIME in the last 168 hours. Cardiac Enzymes: No results for input(s): CKTOTAL, CKMB, CKMBINDEX, TROPONINI in the last 168 hours. BNP (last 3 results) No results for input(s): PROBNP in the last 8760 hours. HbA1C: No results for input(s): HGBA1C in the last 72 hours. CBG: No results for input(s): GLUCAP in the last 168 hours. Lipid Profile: No results for input(s): CHOL, HDL, LDLCALC, TRIG, CHOLHDL, LDLDIRECT in the last 72 hours. Thyroid Function Tests: No results for input(s): TSH, T4TOTAL, FREET4, T3FREE, THYROIDAB in the last 72 hours. Anemia Panel: No results for input(s): VITAMINB12, FOLATE, FERRITIN, TIBC, IRON, RETICCTPCT in the last 72 hours. Urine analysis:    Component Value Date/Time   COLORURINE YELLOW 04/02/2014 1650   APPEARANCEUR CLEAR 04/02/2014 1650   LABSPEC 1.015 04/02/2014 1650   PHURINE 5.5 04/02/2014 1650   GLUCOSEU 100 (A) 04/02/2014 1650   HGBUR MODERATE (A) 04/02/2014 1650   BILIRUBINUR NEGATIVE 04/02/2014 1650    KETONESUR 15 (A) 04/02/2014 1650   PROTEINUR 100 (A) 04/02/2014 1650   UROBILINOGEN 0.2 04/02/2014 1650   NITRITE NEGATIVE 04/02/2014 1650   LEUKOCYTESUR TRACE (A) 04/02/2014 1650    Radiological Exams on Admission: Dg Chest 2 View  Result Date: 09/14/2018 CLINICAL DATA:  Fever EXAM: CHEST - 2 VIEW COMPARISON:  04/05/2014 FINDINGS: Mildly low lung volumes. Linear scarring or atelectasis at the bases. Heart size is normal. No pneumothorax. IMPRESSION: Low lung volumes with linear scarring or atelectasis at the left base. No focal pulmonary infiltrate. Electronically Signed   By: Donavan Foil M.D.   On: 09/14/2018 14:16    EKG: Independently reviewed.  Sinus tachycardia    Assessment and Plan:   1.  Sepsis syndrome: Source unclear.  Mild leukocytosis with high fever.  Blood cultures sent.  Chest x-ray negative so far.  Will repeat after hydration.  Patient received 2 L of IV fluids in the ED and repeat lactate down from 2.1 to 1.7.  Patient reports upper respiratory/sinus symptoms.  Will continue with IV levaquin for URI/ acute sinusitis.  CT sinuses ordered.  Given hypotension, tachycardia and hypoxia in the setting of prior DVT, will rule out PE.  Check for flu  2.  Polycystic kidney disease/ESRD: Resume dialysis TTF.  Being followed and evaluated by nephrology PA in the ED.  Resume home medications.  Patient underwent dialysis treatment today given holiday schedule.  3.  Prior history of DVT/thrombocytosis: Per chart review patient received anticoagulation and phlebotomy treatments in the past.  4.  Hypertension: Resume low-dose metoprolol with holding parameters   DVT prophylaxis: Lovenox  Code Status: Full code  Family Communication: Discussed with patient.  Consults called: Nephrology Admission status:  Patient admitted as observation as anticipated LOS less than 2 midnights    Guilford Shi MD [facility] Pager [phone]  If 7PM-7AM, please contact  night-coverage [email or web address] Password [facility]  09/14/2018, 4:44 PM

## 2018-09-14 NOTE — ED Provider Notes (Signed)
[place] EMERGENCY DEPARTMENT Provider Note   CSN: [id number] Arrival date & time: 09/14/18  1202     History   Chief Complaint No chief complaint on file.   HPI [name] [name] is a 57 y.o. male presenting for evaluation of fever.   Pt states he was at dialysis when he had acute onset of feeling cold and shaking.  His temperature was checked at that time, found to be 102 orally.  EMS was then called.  Patient states he was most of the way through his dialysis.  While waiting for EMS, patient states he coughed up what he felt was postnasal drip, and felt better.  He currently does not feel cold.  He has not been given anything including Tylenol or ibuprofen.pt states he has had sinus draiange for the past month. denies feeling poorly prior to dialysis. denies CP, SOB, n/v, abd pain or abnormal BMs. Pt produces minimal urine. He denies recent medication changes. H/o CKD 2/2 polycystic kidney disease.  Per EMS, pt was found to have low Spo2 enroute, 90% on RA, placed on 2 L O2 and sats improved. Pt is not on O2 at home.   Pt is not on blood thinners, h/o dvt several yra ago and d/c warfarin several yrs ago. No cp or sob. No leg pain or swelling.     HPI  Past Medical History:  Diagnosis Date  . Anemia   . Chronic kidney disease   . DVT (deep venous thrombosis) ([place])   . Hypertension   . Leucocytosis   . Polycystic kidney disease   . Shortness of breath     Patient Active Problem List   Diagnosis Date Noted  . End stage renal disease ([place]) 05/23/2014  . Metabolic acidosis 37/07/6268  . Essential hypertension, benign 04/04/2014  . Acute encephalopathy 04/04/2014  . Polycystic kidney disease 04/02/2014  . Acute renal failure ([place]) 04/02/2014    Past Surgical History:  Procedure Laterality Date  . AV FISTULA PLACEMENT Left 04/05/2014   Procedure: CREATION OF ARTERIOVENOUS (AV) FISTULA ;  Surgeon: Angelia Mould, MD;  Location: [place];  Service:  Vascular;  Laterality: Left;  . CENTRAL VENOUS CATHETER INSERTION  04/02/2014   DR Titus Mould  . INSERTION OF DIALYSIS CATHETER Right 04/05/2014   Procedure: ULTRASOUND GUIDED INSERTION OF DIALYSIS CATHETER;  Surgeon: Angelia Mould, MD;  Location: [place];  Service: Vascular;  Laterality: Right;  . NOSE SURGERY          Home Medications    Prior to Admission medications   Medication Sig Start Date End Date Taking? Authorizing Provider  calcium acetate (PHOSLO) 667 MG capsule Take 2 capsules (1,334 mg total) by mouth 3 (three) times daily with meals. 04/10/14   Ramiro Harvest, PA-C  cetirizine (ZYRTEC) 10 MG tablet Take 10 mg by mouth daily as needed for allergies.    [provider]  cinacalcet (SENSIPAR) 30 MG tablet Take 30 mg by mouth daily.    [provider]  enoxaparin (LOVENOX) 30 MG/0.3ML injection Inject 0.3 mLs (30 mg total) into the skin daily. 04/20/14   Virgel Manifold, MD  feeding supplement, ENSURE COMPLETE, (ENSURE COMPLETE) LIQD Take 237 mLs by mouth 2 (two) times daily between meals. 04/10/14   Ramiro Harvest, PA-C  Ferric Citrate (AURYXIA PO) Take by mouth.    [provider]  metoprolol tartrate (LOPRESSOR) 25 MG tablet Take 0.5 tablets (12.5 mg total) by mouth 2 (two) times daily. 04/10/14  Ramiro Harvest, PA-C  multivitamin (RENA-VIT) TABS tablet Take 1 tablet by mouth every morning. 04/10/14   Ramiro Harvest, PA-C  pantoprazole (PROTONIX) 40 MG tablet Take 40 mg by mouth daily.    [provider]  warfarin (COUMADIN) 5 MG tablet Take 1 tablet (5 mg total) by mouth daily. 04/20/14   Virgel Manifold, MD    Family History Family History  Problem Relation Age of Onset  . Polycystic kidney disease Father   . Hypertension Father   . Polycystic kidney disease Sister     Social History Social History   Tobacco Use  . Smoking status: Former Research scientist (life sciences)  . Smokeless tobacco: Never Used  . Tobacco comment: QUIT SMOKING CIGARETTES  IN 1994       QUIT SMOKING POT   Substance Use Topics  . Alcohol use: No  . Drug use: No     Allergies   Fentanyl and Vancomycin   Review of Systems Review of Systems  Constitutional: Positive for chills.  HENT: Positive for congestion.   All other systems reviewed and are negative.    Physical Exam Updated Vital Signs BP 102/79   Pulse (!) 123   Temp 98.3 F (36.8 C) (Oral)   Resp (!) 26   Ht 5\' 9"  (1.753 m)   Wt 90 kg   SpO2 97%   BMI 29.30 kg/m   Physical Exam  Constitutional: He is oriented to person, place, and time. He appears well-developed and well-nourished.  Chronically ill appearing. Feels warm to the touch  HENT:  Head: Normocephalic and atraumatic.  MM dry  Eyes: Pupils are equal, round, and reactive to light. Conjunctivae and EOM are normal.  Neck: Normal range of motion. Neck supple.  Cardiovascular: Regular rhythm and intact distal pulses.  tachycardic   Pulmonary/Chest: Effort normal. No respiratory distress. He has decreased breath sounds in the right lower field and the left lower field. He has no wheezes.  decreased lung sounds in lower fields bilaterally.   Abdominal: Soft. He exhibits no distension and no mass. There is no tenderness. There is no guarding.  Musculoskeletal: Normal range of motion. He exhibits no edema or tenderness.  No leg pian or swelling  Neurological: He is alert and oriented to person, place, and time.  Skin: Skin is warm and dry. Capillary refill takes less than 2 seconds.  Psychiatric: He has a normal mood and affect.  Nursing note and vitals reviewed.    ED Treatments / Results  Labs (all labs ordered are listed, but only abnormal results are displayed) Labs Reviewed  CULTURE, BLOOD (ROUTINE X 2)  CULTURE, BLOOD (ROUTINE X 2)  COMPREHENSIVE METABOLIC PANEL  CBC WITH DIFFERENTIAL/PLATELET  URINALYSIS, ROUTINE W REFLEX MICROSCOPIC  I-STAT CG4 LACTIC ACID, ED    EKG None  Radiology No results  found.  Procedures .Critical Care Performed by: Franchot Heidelberg, PA-C Authorized by: Franchot Heidelberg, PA-C   Critical care provider statement:    Critical care time (minutes):  45   Critical care time was exclusive of:  Separately billable procedures and treating other patients and teaching time   Critical care was necessary to treat or prevent imminent or life-threatening deterioration of the following conditions:  Sepsis   Critical care was time spent personally by me on the following activities:  Blood draw for specimens, development of treatment plan with patient or surrogate, discussions with consultants, evaluation of patient's response to treatment, examination of patient, obtaining history from patient or surrogate, ordering and performing  treatments and interventions, ordering and review of radiographic studies, review of old charts, re-evaluation of patient's condition, pulse oximetry and ordering and review of laboratory studies   I assumed direction of critical care for this patient from another provider in my specialty: no   Comments:     Pt meets sepsis criteria, iv fluid and abx started.   (including critical care time)  Medications Ordered in ED Medications - No data to display   Initial Impression / Assessment and Plan / ED Course  I have reviewed the triage vital signs and the nursing notes.  Pertinent labs & imaging results that were available during my care of the patient were reviewed by me and considered in my medical decision making (see chart for details).     Pt presenting for evaluation of fever.  Physical exam concerning, patient is tachycardic and tachypneic.  Sats stable on oxygen, although patient is not on oxygen at home.  He feels warm to the touch despite normal oral temperature.  Will order rectal temp.  Concern for sepsis, sitting patient's previous temperature of 102, tachycardia, and tachypnea.  Code sepsis called.  Will start with 1 L of fluids,  as patient is on dialysis and is not septic shock.  Consider pulmonary source, as patient has no hypoxia and tachypnea.  Will order Levaquin per pharmacy. Will consult with dialysis for further management of fluid needs. Case discussed with attending, Dr. Eulis Foster evaluated the pt.   Rectal temp elevated at 103.  Tylenol ordered.  Heart rate improving with fluids.  Labs with mild elevation of white count at 11.6.  Lactic mildly elevated at 2.18.  Chest x-ray and urine pending.  Chest x-ray viewed interpreted by me, no pneumonia, pneumothorax, effusion, or cardiomegaly.  However, I am still concerned about a pulmonary source.  Urine pending.  Informed by RN, the patient's blood pressure is decreasing, went down to 70 systolic.  Will increase fluids to meet 30 cc/kg, and will have nephrology continue to monitor for fluid management and dialysis needs in the future. Lactic improved as expected, and HR continues to improve. Sepsis reassessment done. Will call for admission.   Discussed with [facility] service, pt to be admitted.   Final Clinical Impressions(s) / ED Diagnoses   Final diagnoses:  Sepsis, due to unspecified organism, unspecified whether acute organ dysfunction present [facility])    ED Discharge Orders    None       Franchot Heidelberg, PA-C 09/14/18 1615    Daleen Bo, MD 09/14/18 2030

## 2018-09-14 NOTE — ED Triage Notes (Signed)
Pt brought in by [facility] from dialysis. Pt completes dialysis on T, THUR, SAT but went today due to thanksgiving. Pt completed 2.8 L of 3 L. Pt started having chills, fever and shaking during dialysis. Pts temp was 102. Pt A&O x4. Pt states he had increased SOB. Denies pain.  120/80 BP 130 HR ST 30 RR 98% 2L

## 2018-09-14 NOTE — Progress Notes (Addendum)
Mr. 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  PMHx also significant for polycystic liver disease, prior DVT in 2015 anticoagulated for 3 months He has required periodic phlembotomy for chronic thrombocytosis.  Patient presented to dialysis today with no complaints except feeling cold.  He started having shaking chills without fever. He completed his full dialysis treatment and was seen by the rounding provider.  BC were drawn but no antibioitcs given.  Following dialysis he had tachycardia and tachypnea and was sent to ED for further evaluation. He had a net UF of 2.2 L VS were stable per outpatient records.  HD orders: TTS [facility] 4 hr EDS 90 160 400/800 2 K 2.25 Ca var Na no profile heparin 5600 hectorol 3 No ESA/Fe Access: left lower AVF  Recent outpatient labs:  hgb 15.2 47% sat iPTH 387 Ca/P ok   Labs: today in ED WBC 11.6 with 88% Neutrophils, hgb 16.8 K 3.9 post HD plts 179 lactic acid 2.18    CXR: "no focal infiltrate"  BC drawn - levaquin empirically started. Has Vanc allergy.  Full renal consult to be done tomorrow.  No dialysis related needs today.  Amalia Hailey, PA-C [facility] Beeper [phone]  Pt seen, examined and agree w A/P as above.  Kelly Splinter MD [employer] 09/14/2018, 5:32 PM

## 2018-09-14 NOTE — ED Notes (Signed)
Patient transported to CT 

## 2018-09-14 NOTE — ED Provider Notes (Signed)
  Face-to-face evaluation   History: Patient presents from dialysis where he began to feel cold and started shaking.  Temperature checked and was elevated.  Dialysis treatment nearly completed, he was sent here for further evaluation.  Physical exam: Alert calm, cooperative. Oral mucous membranes dry.  Heart cardiac without murmur.  Lungs clear anteriorly.  Abdomen soft nontender.  Medical screening examination/treatment/procedure(s) were conducted as a shared visit with non-physician practitioner(s) and myself.  I personally evaluated the patient during the encounter    Daleen Bo, MD 09/14/18 2030

## 2018-09-14 NOTE — ED Notes (Signed)
Contacted admitting MD about pt's BP. See new orders.

## 2018-09-15 DIAGNOSIS — R8281 Pyuria: Secondary | ICD-10-CM | POA: Diagnosis present

## 2018-09-15 DIAGNOSIS — Z992 Dependence on renal dialysis: Secondary | ICD-10-CM | POA: Diagnosis not present

## 2018-09-15 DIAGNOSIS — Z87891 Personal history of nicotine dependence: Secondary | ICD-10-CM | POA: Diagnosis not present

## 2018-09-15 DIAGNOSIS — R0902 Hypoxemia: Secondary | ICD-10-CM | POA: Diagnosis present

## 2018-09-15 DIAGNOSIS — Z881 Allergy status to other antibiotic agents status: Secondary | ICD-10-CM | POA: Diagnosis not present

## 2018-09-15 DIAGNOSIS — J181 Lobar pneumonia, unspecified organism: Secondary | ICD-10-CM

## 2018-09-15 DIAGNOSIS — A419 Sepsis, unspecified organism: Secondary | ICD-10-CM | POA: Diagnosis not present

## 2018-09-15 DIAGNOSIS — I120 Hypertensive chronic kidney disease with stage 5 chronic kidney disease or end stage renal disease: Secondary | ICD-10-CM | POA: Diagnosis present

## 2018-09-15 DIAGNOSIS — R1310 Dysphagia, unspecified: Secondary | ICD-10-CM | POA: Diagnosis present

## 2018-09-15 DIAGNOSIS — E8889 Other specified metabolic disorders: Secondary | ICD-10-CM | POA: Diagnosis present

## 2018-09-15 DIAGNOSIS — J189 Pneumonia, unspecified organism: Secondary | ICD-10-CM | POA: Diagnosis present

## 2018-09-15 DIAGNOSIS — D631 Anemia in chronic kidney disease: Secondary | ICD-10-CM | POA: Diagnosis present

## 2018-09-15 DIAGNOSIS — I10 Essential (primary) hypertension: Secondary | ICD-10-CM | POA: Diagnosis not present

## 2018-09-15 DIAGNOSIS — N2581 Secondary hyperparathyroidism of renal origin: Secondary | ICD-10-CM | POA: Diagnosis present

## 2018-09-15 DIAGNOSIS — Z8271 Family history of polycystic kidney: Secondary | ICD-10-CM | POA: Diagnosis not present

## 2018-09-15 DIAGNOSIS — Z885 Allergy status to narcotic agent status: Secondary | ICD-10-CM | POA: Diagnosis not present

## 2018-09-15 DIAGNOSIS — Z8249 Family history of ischemic heart disease and other diseases of the circulatory system: Secondary | ICD-10-CM | POA: Diagnosis not present

## 2018-09-15 DIAGNOSIS — J019 Acute sinusitis, unspecified: Secondary | ICD-10-CM | POA: Diagnosis present

## 2018-09-15 DIAGNOSIS — Q613 Polycystic kidney, unspecified: Secondary | ICD-10-CM | POA: Diagnosis not present

## 2018-09-15 DIAGNOSIS — J302 Other seasonal allergic rhinitis: Secondary | ICD-10-CM | POA: Diagnosis present

## 2018-09-15 DIAGNOSIS — Z79899 Other long term (current) drug therapy: Secondary | ICD-10-CM | POA: Diagnosis not present

## 2018-09-15 DIAGNOSIS — Z86718 Personal history of other venous thrombosis and embolism: Secondary | ICD-10-CM | POA: Diagnosis not present

## 2018-09-15 DIAGNOSIS — R509 Fever, unspecified: Secondary | ICD-10-CM | POA: Diagnosis present

## 2018-09-15 DIAGNOSIS — N186 End stage renal disease: Secondary | ICD-10-CM | POA: Diagnosis not present

## 2018-09-15 DIAGNOSIS — Z79890 Hormone replacement therapy (postmenopausal): Secondary | ICD-10-CM | POA: Diagnosis not present

## 2018-09-15 DIAGNOSIS — D473 Essential (hemorrhagic) thrombocythemia: Secondary | ICD-10-CM | POA: Diagnosis present

## 2018-09-15 LAB — CBC
HCT: 41.9 % (ref 39.0–52.0)
HEMATOCRIT: 42.3 % (ref 39.0–52.0)
HEMOGLOBIN: 13.3 g/dL (ref 13.0–17.0)
Hemoglobin: 13.4 g/dL (ref 13.0–17.0)
MCH: 30.1 pg (ref 26.0–34.0)
MCH: 29.6 pg (ref 26.0–34.0)
MCHC: 31.4 g/dL (ref 30.0–36.0)
MCHC: 32 g/dL (ref 30.0–36.0)
MCV: 95.7 fL (ref 80.0–100.0)
MCV: 92.5 fL (ref 80.0–100.0)
NRBC: 0 % (ref 0.0–0.2)
PLATELETS: 136 10*3/uL — AB (ref 150–400)
Platelets: 157 10*3/uL (ref 150–400)
RBC: 4.42 MIL/uL (ref 4.22–5.81)
RBC: 4.53 MIL/uL (ref 4.22–5.81)
RDW: 12.8 % (ref 11.5–15.5)
RDW: 13.1 % (ref 11.5–15.5)
WBC: 9.4 10*3/uL (ref 4.0–10.5)
WBC: 9.2 10*3/uL (ref 4.0–10.5)
nRBC: 0 % (ref 0.0–0.2)

## 2018-09-15 LAB — RESPIRATORY PANEL BY PCR
ADENOVIRUS-RVPPCR: NOT DETECTED
Bordetella pertussis: NOT DETECTED
CHLAMYDOPHILA PNEUMONIAE-RVPPCR: NOT DETECTED
CORONAVIRUS OC43-RVPPCR: NOT DETECTED
Coronavirus 229E: NOT DETECTED
Coronavirus HKU1: NOT DETECTED
Coronavirus NL63: NOT DETECTED
INFLUENZA A-RVPPCR: NOT DETECTED
INFLUENZA B-RVPPCR: NOT DETECTED
Metapneumovirus: NOT DETECTED
Mycoplasma pneumoniae: NOT DETECTED
Parainfluenza Virus 1: NOT DETECTED
Parainfluenza Virus 2: NOT DETECTED
Parainfluenza Virus 3: NOT DETECTED
Parainfluenza Virus 4: NOT DETECTED
RHINOVIRUS / ENTEROVIRUS - RVPPCR: NOT DETECTED
Respiratory Syncytial Virus: NOT DETECTED

## 2018-09-15 LAB — BASIC METABOLIC PANEL
Anion gap: 10 (ref 5–15)
BUN: 30 mg/dL — AB (ref 6–20)
CALCIUM: 8.8 mg/dL — AB (ref 8.9–10.3)
CO2: 22 mmol/L (ref 22–32)
CREATININE: 8.82 mg/dL — AB (ref 0.61–1.24)
Chloride: 103 mmol/L (ref 98–111)
GFR calc Af Amer: 7 mL/min — ABNORMAL LOW (ref >60)
GFR, EST NON AFRICAN AMERICAN: 6 mL/min — AB (ref >60)
GLUCOSE: 96 mg/dL (ref 70–99)
Potassium: 4.9 mmol/L (ref 3.5–5.1)
Sodium: 135 mmol/L (ref 135–145)

## 2018-09-15 LAB — HIV ANTIBODY (ROUTINE TESTING W REFLEX): HIV Screen 4th Generation wRfx: NONREACTIVE

## 2018-09-15 LAB — STREP PNEUMONIAE URINARY ANTIGEN: STREP PNEUMO URINARY ANTIGEN: NEGATIVE

## 2018-09-15 MED ORDER — CINACALCET HCL 30 MG PO TABS: 30.0000 mg | ORAL_TABLET | ORAL | Status: DC

## 2018-09-15 MED ORDER — AZITHROMYCIN 500 MG PO TABS
500.0000 mg | ORAL_TABLET | ORAL | Status: DC
  Administered 2018-09-15 – 2018-09-16 (×2): 500 mg via ORAL
  Filled 2018-09-15 (×2): qty 1

## 2018-09-15 MED ORDER — DOXERCALCIFEROL 4 MCG/2ML IV SOLN
3.0000 ug | INTRAVENOUS | Status: DC
  Filled 2018-09-15: qty 2

## 2018-09-15 MED ORDER — SODIUM CHLORIDE 0.9 % IV SOLN
1.0000 g | INTRAVENOUS | Status: DC
  Administered 2018-09-15 – 2018-09-16 (×2): 1 g via INTRAVENOUS
  Filled 2018-09-15 (×2): qty 10

## 2018-09-15 NOTE — Consult Note (Addendum)
[facility] Renal Consultation Note    Indication for Consultation:  Management of ESRD/hemodialysis; anemia, hypertension/volume and secondary hyperparathyroidism PCP: None  HPI: [name] [name] is a 57 y.o. 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 He presented to dialysis yesterday in his usual state of health and then felt cold when the air was blowing on him.  He did not spike a temp at dialysis but had rigors and was sent to the ED for evaluation post HD after [facility] were drawn but no antibiotics given.  He had net UF of 2.2 with stable VS.  Post HD temp was 97.8 and weight was 90.2  He is compliant with treatments. He c/o sore throat today, coughing phelgm at times.    Temp in the ED yesterday spike to 103.1 He was given 1.4 L fluid yesterday per sepsis protocol and low BP BC are [pending UA showed 6 - 10 RBC 21 -50 WBC and rare bacteria.  EKG sinus tach CXR neg, CT angio no PE, bibasilar atx, potential PNA, mod centrilobular emphysema (prior smoker). Sinus CT neg for acute sinusitis  Past Medical History:  Diagnosis Date  . Anemia   . Chronic kidney disease   . DVT (deep venous thrombosis) ([place])   . Hypertension   . Leucocytosis   . Polycystic kidney disease   . Shortness of breath    Past Surgical History:  Procedure Laterality Date  . AV FISTULA PLACEMENT Left 04/05/2014   Procedure: CREATION OF ARTERIOVENOUS (AV) FISTULA ;  Surgeon: Angelia Mould, MD;  Location: [facility];  Service: Vascular;  Laterality: Left;  . CENTRAL VENOUS CATHETER INSERTION  04/02/2014   DR Titus Mould  . INSERTION OF DIALYSIS CATHETER Right 04/05/2014   Procedure: ULTRASOUND GUIDED INSERTION OF DIALYSIS CATHETER;  Surgeon: Angelia Mould, MD;  Location: [facility];  Service: Vascular;  Laterality: Right;  . NOSE  SURGERY     Family History  Problem Relation Age of Onset  . Polycystic kidney disease Father   . Hypertension Father   . Polycystic kidney disease Sister    Social History:  reports that he has quit smoking. He has never used smokeless tobacco. He reports that he does not drink alcohol or use drugs. Allergies  Allergen Reactions  . Fentanyl Nausea Only  . Vancomycin Other (See Comments)    Red face   Prior to Admission medications   Medication Sig Start Date End Date Taking? Authorizing Provider  cetirizine (ZYRTEC) 10 MG tablet Take 10 mg by mouth daily as needed for allergies.   Yes [provider]  cinacalcet (SENSIPAR) 30 MG tablet Take 30 mg by mouth See admin instructions.    Yes [provider]  ferric citrate (AURYXIA) 1 GM 210 MG(Fe) tablet Take 630 mg by mouth 3 (three) times daily with meals.    Yes [provider]  fluticasone (FLONASE) 50 MCG/ACT nasal spray Place 2 sprays into both nostrils daily as needed for allergies. 12/15/17  Yes [provider]  multivitamin (RENA-VIT) TABS tablet Take 1 tablet by mouth every morning. 04/10/14  Yes Lyles, Charles, PA-C  pantoprazole (PROTONIX) 40 MG tablet Take 40 mg by mouth daily.   Yes [provider]  calcium acetate (PHOSLO) 667 MG capsule Take 2 capsules (1,334 mg total) by mouth 3 (three) times daily with meals.  Patient not taking: Reported on 09/14/2018 04/10/14   Ramiro Harvest, PA-C  enoxaparin (LOVENOX) 30 MG/0.3ML injection Inject 0.3 mLs (30 mg total) into the skin daily. Patient not taking: Reported on 09/14/2018 04/20/14   Virgel Manifold, MD  feeding supplement, ENSURE COMPLETE, (ENSURE COMPLETE) LIQD Take 237 mLs by mouth 2 (two) times daily between meals. Patient not taking: Reported on 09/14/2018 04/10/14   Ramiro Harvest, PA-C  metoprolol tartrate (LOPRESSOR) 25 MG tablet Take 0.5 tablets (12.5 mg total) by mouth 2 (two) times daily. Patient not taking: Reported on  09/14/2018 04/10/14   Ramiro Harvest, PA-C  warfarin (COUMADIN) 5 MG tablet Take 1 tablet (5 mg total) by mouth daily. Patient not taking: Reported on 09/14/2018 04/20/14   Virgel Manifold, MD   Current Facility-Administered Medications  Medication Dose Route Frequency Provider Last Rate Last Dose  . azithromycin (ZITHROMAX) tablet 500 mg  500 mg Oral Q24H Charlynne Cousins, MD      . calcium acetate (PHOSLO) capsule 1,334 mg  1,334 mg Oral TID WC Kamineni, Neelima, MD      . cefTRIAXone (ROCEPHIN) 1 g in sodium chloride 0.9 % 100 mL IVPB  1 g Intravenous Q24H Charlynne Cousins, MD      . cinacalcet [facility]) tablet 30 mg  30 mg Oral Q breakfast Guilford Shi, MD   30 mg at 09/15/18 0859  . enoxaparin (LOVENOX) injection 30 mg  30 mg Subcutaneous Q24H Guilford Shi, MD   30 mg at 09/14/18 2221  . feeding supplement (ENSURE ENLIVE) (ENSURE ENLIVE) liquid 237 mL  237 mL Oral BID BM Kamineni, Neelima, MD      . ferric citrate (AURYXIA) tablet 630 mg  630 mg Oral TID WC Guilford Shi, MD   630 mg at 09/15/18 0858  . fluticasone (FLONASE) 50 MCG/ACT nasal spray 2 spray  2 spray Each Nare Daily Kamineni, Neelima, MD      . ipratropium-albuterol (DUONEB) 0.5-2.5 (3) MG/3ML nebulizer solution 3 mL  3 mL Nebulization Q6H PRN Guilford Shi, MD      . loratadine (CLARITIN) tablet 10 mg  10 mg Oral Daily Guilford Shi, MD   10 mg at 09/15/18 0938  . metoprolol tartrate (LOPRESSOR) tablet 12.5 mg  12.5 mg Oral BID Guilford Shi, MD   12.5 mg at 09/15/18 0938  . multivitamin (RENA-VIT) tablet 1 tablet  1 tablet Oral QHS Guilford Shi, MD   1 tablet at 09/14/18 2221  . pantoprazole (PROTONIX) EC tablet 40 mg  40 mg Oral Daily Guilford Shi, MD   40 mg at 09/15/18 4627   Labs: Basic Metabolic Panel: Recent Labs  Lab 09/14/18 1233 09/14/18 1826 09/15/18 0539  NA 136  --  135  K 3.9  --  4.9  CL 94*  --  103  CO2 29  --  22  GLUCOSE 98  --  96  BUN 16  --  30*   CREATININE 6.85* 7.37* 8.82*  CALCIUM 9.2  --  8.8*   Liver Function Tests: Recent Labs  Lab 09/14/18 1233  AST 22  ALT 21  ALKPHOS 93  BILITOT 1.0  PROT 7.3  ALBUMIN 3.7   CBC: Recent Labs  Lab 09/14/18 1233 09/14/18 1826 09/15/18 0539  WBC 11.6* 9.4 9.2  NEUTROABS 10.3*  --   --   HGB 16.8 13.3 13.4  HCT 52.2* 42.3 41.9  MCV 92.2 95.7 92.5  PLT 179 157 136*   Studies/Results: Dg Chest 2 View  Result Date:  09/14/2018 CLINICAL DATA:  Fever EXAM: CHEST - 2 VIEW COMPARISON:  04/05/2014 FINDINGS: Mildly low lung volumes. Linear scarring or atelectasis at the bases. Heart size is normal. No pneumothorax. IMPRESSION: Low lung volumes with linear scarring or atelectasis at the left base. No focal pulmonary infiltrate. Electronically Signed   By: Donavan Foil M.D.   On: 09/14/2018 14:16   Ct Angio Chest Pe W Or Wo Contrast  Result Date: 09/14/2018 CLINICAL DATA:  Chills, shortness of breath. History of end-stage renal disease on dialysis, deep vein thrombosis. EXAM: CT ANGIOGRAPHY CHEST WITH CONTRAST TECHNIQUE: Multidetector CT imaging of the chest was performed using the standard protocol during bolus administration of intravenous contrast. Multiplanar CT image reconstructions and MIPs were obtained to evaluate the vascular anatomy. CONTRAST:  16mL ISOVUE-370 IOPAMIDOL (ISOVUE-370) INJECTION 76% COMPARISON:  Chest radiograph September 14, 2018 FINDINGS: CARDIOVASCULAR: Adequate contrast opacification of the pulmonary artery's. Main pulmonary artery is not enlarged. No pulmonary arterial filling defects to the level of the subsegmental branches. Heart size is normal. Mild coronary artery calcification. No pericardial effusion. Thoracic aorta is normal course and caliber, trace calcific atherosclerosis descending aorta. MEDIASTINUM/NODES: No lymphadenopathy by CT size criteria. LUNGS/PLEURA: Tracheobronchial tree is patent, no pneumothorax. Patchy bibasilar and lingular subpleural  consolidation with air bronchograms. No pleural effusion. Moderate centrilobular emphysema. UPPER ABDOMEN: Non-acute. Small hiatal hernia. Stable numerous renal cysts and liver cysts. MUSCULOSKELETAL: Non-acute. Mildly sclerotic axial skeleton seen with renal osteodystrophy. Review of the MIP images confirms the above findings. IMPRESSION: 1. No pulmonary embolism. 2. Bibasilar atelectasis, potential pneumonia. 3. Sequelae of polycystic kidney disease. Aortic Atherosclerosis (ICD10-I70.0) and Emphysema (ICD10-J43.9). Electronically Signed   By: Elon Alas M.D.   On: 09/14/2018 18:12   Ct Maxillofacial Wo Contrast  Result Date: 09/14/2018 CLINICAL DATA:  Chills with shortness of breath and sinus drainage for 1 month. Hemodialysis patient. EXAM: CT MAXILLOFACIAL WITHOUT CONTRAST TECHNIQUE: Multidetector CT imaging of the maxillofacial structures was performed. Multiplanar CT image reconstructions were also generated. COMPARISON:  None. FINDINGS: Osseous: No evidence of acute fracture, dislocation or bone destruction. 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 The maxillary incisors are missing. There is some periodontal disease involving the maxillary lateral incisors. Orbits: The globes are intact. No inflammatory changes or foreign bodies are identified. The optic nerves and extraocular muscles appear normal. Sinuses: There is a small mucous retention cyst or polyp inferiorly in the left maxillary sinus. The visualized paranasal sinuses are otherwise clear without air-fluid levels. The mastoid air cells and middle ears are clear. Soft tissues: No inflammatory changes or focal fluid collections. Limited intracranial: Unremarkable. IMPRESSION: 1. No evidence of sinusitis or acute osseous findings. 2. The orbits appear normal. 3. Incidental findings including a small left maxillary sinus mucous retention cyst or polyp, central maxillary periodontal disease and left TMJ  osteoarthritis. Electronically Signed   By: Richardean Sale M.D.   On: 09/14/2018 18:05    ROS: General: No weight loss, + fever, chills acute onset yesterday - better now, chronic allergies "allergic to fall" HEENT: No recent headaches, nasal bleeding,  visual changes, + sore throat + sinus drainage Neurologic: No dizziness, blackouts, seizures Cardiac: No recent episodes of chest pain/pressure,  shortness of breath at rest or DOE.  Vascular: No history of rest pain in feet, claudication, nonhealing ulcers  or DVT  Pulmonary: No home oxygen,no cough, hemoptysis, or wheezing + phlegm in throat with associated coughing Musculoskeletal: no arthritis, low back pain, or joint pain  Hematologic:No  history of hypercoagulable state. No history of easy bleeding. No history of anemia  Gastrointestinal: No hematochezia or melena, No gastroesophageal reflux, no trouble swallowing + dark loose stools since starting Aurixya Urinary: Anuric or makes small amount of urine, no nurning with urination or frequency   Skin: No rashes or lesions or open wounds Psychological: No  anxiety or depression   Physical Exam: Vitals:   09/14/18 1845 09/14/18 2159 09/15/18 0520 09/15/18 0943  BP: 96/65 95/63 126/75 125/67  Pulse: 61 67 97 72  Resp: 16 16 16 18   Temp: 97.6 F (36.4 C) 98.2 F (36.8 C) 98.2 F (36.8 C) 97.8 F (36.6 C)  TempSrc: Oral Oral Oral Oral  SpO2: 95% 98% 92% 97%  Weight:  90 kg    Height:         General: WDWN NAD breathing easily  Head: NCAT sclera not icteric MMM,  OP - no erythema but some cobblestoning of post pharynx - no exudates  Neck: Supple.  Lungs: CTA bilaterally without wheezes, rales, or rhonchi. Breathing is unlabored. Heart: RRR  Abdomen: full with palpable kidneys soft NT + BS Lower extremities:without edema or ischemic changes, no open wounds  Neuro: A & O  X 3. Moves all extremities spontaneously. Psych:  Responds to questions appropriately with a normal  affect. Dialysis Access: left lower AVF + bruit - dressing removed - no evidence of infection  Dialysis Orders: TTS NW 4 hr 160 EDW 90 gets +/- EDW 2 K 2.25 Ca var Na left AVF heparin 5600 Hectorol 3 no ESA or Fe Binders 3 aurixya ac and sensipar 3 qod  Assessment/Plan: 1.  Fever - possible PNA - antibiotics per primary 2.  ESRD secondary to PCKD-  TTS - next HD Satuday K 3.9 post HD up to 4.9 today - repeat tomorrow - diet changed to renal 3.  Hypertension/volume  - had been receiving IVF at 16 - have discontinued. MTP 12.5 bid BP controlled usually runs SBP 100 - 115  4.  Anemia  - actually has chronic thrombocytosis  Requiring periodic phlebotomy - no ESA/Fe  5.  Metabolic bone disease -  On 3 aurixya and sensipar 30 qod/Hectorol 3 d/c calcium acetate - he is not taking 6.  Nutrition - changed from heart to renal diet due to high K content of Heart Healthy diet 7.  Hx DVT 2015 -[place], PA-C [facility] Beeper [phone] 09/15/2018, 11:13 AM   Pt seen, examined and agree w A/P as above.  Kelly Splinter MD [employer] pager [phone]   09/15/2018, 1:30 PM

## 2018-09-15 NOTE — Progress Notes (Signed)
[facility] PROGRESS NOTE    Progress Note  [name] [name]  RSW:[id number] DOB: January 24, 1961 DOA: 09/14/2018 PCP: Patient, No Pcp Per     Brief Narrative:   [name] [name] is an 57 y.o. male past medical history of hypertension, polycystic kidney disease, thrombocytosis requiring phlebotomy treatments, DVT, who is status post anticoagulation for 3 months, end-stage renal disease on Tuesday Thursdays and Saturdays was sent from dialysis unit due to fever and chills, he relates he has had a productive cough on the day of admission had fever and chills at the dialysis center was noted to have a temperature of 102.  Assessment/Plan:   Sepsis ([place]): With fevers on admission, mild leukocytosis, was started on the sepsis pathway. Resperatory panel negative started empirically on IV Levaquin. HIV negative. CTA of the chest done on 09/15/2018 showed no PE but possible pneumonia. The history of fever and productive cough with a leukocytosis and CTA showing possible pneumonia we will continue to treat with antibiotics. Antibiotic to IV Rocephin and azithro.  Polycystic kidney disease/end-stage renal disease: Further management per nephrology.  Essential hypertension, benign: To metoprolol.   DVT prophylaxis: lovenox Family Communication:none Disposition Plan/Barrier to D/C: home in 1-2 days Code Status:     Code Status Orders  (From admission, onward)         Start     Ordered   09/14/18 1651  Full code  Continuous     09/14/18 1651        Code Status History    Date Active Date Inactive Code Status Order ID Comments User Context   04/02/2014 1523 04/10/2014 1837 Full Code [id number]  Donita Brooks, NP ED        IV Access:    Peripheral IV   Procedures and diagnostic studies:   Dg Chest 2 View  Result Date: 09/14/2018 CLINICAL DATA:  Fever EXAM: CHEST - 2 VIEW COMPARISON:  04/05/2014 FINDINGS: Mildly low lung volumes. Linear scarring or atelectasis at the bases.  Heart size is normal. No pneumothorax. IMPRESSION: Low lung volumes with linear scarring or atelectasis at the left base. No focal pulmonary infiltrate. Electronically Signed   By: Donavan Foil M.D.   On: 09/14/2018 14:16   Ct Angio Chest Pe W Or Wo Contrast  Result Date: 09/14/2018 CLINICAL DATA:  Chills, shortness of breath. History of end-stage renal disease on dialysis, deep vein thrombosis. EXAM: CT ANGIOGRAPHY CHEST WITH CONTRAST TECHNIQUE: Multidetector CT imaging of the chest was performed using the standard protocol during bolus administration of intravenous contrast. Multiplanar CT image reconstructions and MIPs were obtained to evaluate the vascular anatomy. CONTRAST:  59mL ISOVUE-370 IOPAMIDOL (ISOVUE-370) INJECTION 76% COMPARISON:  Chest radiograph September 14, 2018 FINDINGS: CARDIOVASCULAR: Adequate contrast opacification of the pulmonary artery's. Main pulmonary artery is not enlarged. No pulmonary arterial filling defects to the level of the subsegmental branches. Heart size is normal. Mild coronary artery calcification. No pericardial effusion. Thoracic aorta is normal course and caliber, trace calcific atherosclerosis descending aorta. MEDIASTINUM/NODES: No lymphadenopathy by CT size criteria. LUNGS/PLEURA: Tracheobronchial tree is patent, no pneumothorax. Patchy bibasilar and lingular subpleural consolidation with air bronchograms. No pleural effusion. Moderate centrilobular emphysema. UPPER ABDOMEN: Non-acute. Small hiatal hernia. Stable numerous renal cysts and liver cysts. MUSCULOSKELETAL: Non-acute. Mildly sclerotic axial skeleton seen with renal osteodystrophy. Review of the MIP images confirms the above findings. IMPRESSION: 1. No pulmonary embolism. 2. Bibasilar atelectasis, potential pneumonia. 3. Sequelae of polycystic kidney disease. Aortic Atherosclerosis (ICD10-I70.0) and Emphysema (ICD10-J43.9). Electronically Signed  By: Elon Alas M.D.   On: 09/14/2018 18:12   Ct  Maxillofacial Wo Contrast  Result Date: 09/14/2018 CLINICAL DATA:  Chills with shortness of breath and sinus drainage for 1 month. Hemodialysis patient. EXAM: CT MAXILLOFACIAL WITHOUT CONTRAST TECHNIQUE: Multidetector CT imaging of the maxillofacial structures was performed. Multiplanar CT image reconstructions were also generated. COMPARISON:  None. FINDINGS: Osseous: No evidence of acute fracture, dislocation or bone destruction. 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 The maxillary incisors are missing. There is some periodontal disease involving the maxillary lateral incisors. Orbits: The globes are intact. No inflammatory changes or foreign bodies are identified. The optic nerves and extraocular muscles appear normal. Sinuses: There is a small mucous retention cyst or polyp inferiorly in the left maxillary sinus. The visualized paranasal sinuses are otherwise clear without air-fluid levels. The mastoid air cells and middle ears are clear. Soft tissues: No inflammatory changes or focal fluid collections. Limited intracranial: Unremarkable. IMPRESSION: 1. No evidence of sinusitis or acute osseous findings. 2. The orbits appear normal. 3. 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 Electronically Signed   By: Richardean Sale M.D.   On: 09/14/2018 18:05     Medical Consultants:    None.  Anti-Infectives:   IV Rocephin and azithro  Subjective:    [name] [name] seems to have a productive cough he feels nauseated continues to vomit, not able to tolerate much orals patient is very tangential and superficial in his answers.  Objective:    Vitals:   09/14/18 1845 09/14/18 2159 09/15/18 0520 09/15/18 0943  BP: 96/65 95/63 126/75 125/67  Pulse: 61 67 97 72  Resp: 16 16 16 18   Temp: 97.6 F (36.4 C) 98.2 F (36.8 C) 98.2 F (36.8 C) 97.8 F  (36.6 C)  TempSrc: Oral Oral Oral Oral  SpO2: 95% 98% 92% 97%  Weight:  90 kg    Height:        Intake/Output Summary (Last 24 hours) at 09/15/2018 0948 Last data filed at 09/15/2018 0617 Gross per 24 hour  Intake 1400 ml  Output 60 ml  Net 1340 ml   Filed Weights   09/14/18 1205 09/14/18 2159  Weight: 90 kg 90 kg    Exam: General exam: In no acute distress. Respiratory system: Good air movement and clear to auscultation. Cardiovascular system: S1 & S2 heard, RRR.  Gastrointestinal system: Abdomen is nondistended, soft and nontender.  Central nervous system: Alert and oriented. No focal neurological deficits. Extremities: No pedal edema. Skin: No rashes, lesions or ulcers Psychiatry: Judgement and insight appear normal. Mood & affect appropriate.    Data Reviewed:    Labs: Basic Metabolic Panel: Recent Labs  Lab 09/14/18 1233 09/14/18 1826 09/15/18 0539  NA 136  --  135  K 3.9  --  4.9  CL 94*  --  103  CO2 29  --  22  GLUCOSE 98  --  96  BUN 16  --  30*  CREATININE 6.85* 7.37* 8.82*  CALCIUM 9.2  --  8.8*   GFR Estimated Creatinine Clearance: 10.5 mL/min (A) (by C-G formula based on SCr of 8.82 mg/dL (H)). Liver Function Tests: Recent Labs  Lab 09/14/18 1233  AST 22  ALT 21  ALKPHOS 93  BILITOT 1.0  PROT 7.3  ALBUMIN 3.7   No results for input(s): LIPASE, AMYLASE in the last 168 hours. No results for  input(s): AMMONIA in the last 168 hours. Coagulation profile No results for input(s): INR, PROTIME in the last 168 hours.  CBC: Recent Labs  Lab 09/14/18 1233 09/14/18 1826 09/15/18 0539  WBC 11.6* 9.4 9.2  NEUTROABS 10.3*  --   --   HGB 16.8 13.3 13.4  HCT 52.2* 42.3 41.9  MCV 92.2 95.7 92.5  PLT 179 157 136*   Cardiac Enzymes: No results for input(s): CKTOTAL, CKMB, CKMBINDEX, TROPONINI in the last 168 hours. BNP (last 3 results) No results for input(s): PROBNP in the last 8760 hours. CBG: No results for input(s): GLUCAP in the last  168 hours. D-Dimer: No results for input(s): DDIMER in the last 72 hours. Hgb A1c: No results for input(s): HGBA1C in the last 72 hours. Lipid Profile: No results for input(s): CHOL, HDL, LDLCALC, TRIG, CHOLHDL, LDLDIRECT in the last 72 hours. Thyroid function studies: No results for input(s): TSH, T4TOTAL, T3FREE, THYROIDAB in the last 72 hours.  Invalid input(s): FREET3 Anemia work up: No results for input(s): VITAMINB12, FOLATE, FERRITIN, TIBC, IRON, RETICCTPCT in the last 72 hours. Sepsis Labs: Recent Labs  Lab 09/14/18 1233 09/14/18 1258 09/14/18 1540 09/14/18 1826 09/15/18 0539  WBC 11.6*  --   --  9.4 9.2  LATICACIDVEN  --  2.18* 1.17  --   --    Microbiology Recent Results (from the past 240 hour(s))  Respiratory Panel by PCR     Status: None   Collection Time: 09/14/18  6:50 PM  Result Value Ref Range Status   Adenovirus NOT DETECTED NOT DETECTED Final   Coronavirus 229E NOT DETECTED NOT DETECTED Final   Coronavirus HKU1 NOT DETECTED NOT DETECTED Final   Coronavirus NL63 NOT DETECTED NOT DETECTED Final   Coronavirus OC43 NOT DETECTED NOT DETECTED Final   Metapneumovirus NOT DETECTED NOT DETECTED Final   Rhinovirus / Enterovirus NOT DETECTED NOT DETECTED Final   Influenza A NOT DETECTED NOT DETECTED Final   Influenza B NOT DETECTED NOT DETECTED Final   Parainfluenza Virus 1 NOT DETECTED NOT DETECTED Final   Parainfluenza Virus 2 NOT DETECTED NOT DETECTED Final   Parainfluenza Virus 3 NOT DETECTED NOT DETECTED Final   Parainfluenza Virus 4 NOT DETECTED NOT DETECTED Final   Respiratory Syncytial Virus NOT DETECTED NOT DETECTED Final   Bordetella pertussis NOT DETECTED NOT DETECTED Final   Chlamydophila pneumoniae NOT DETECTED NOT DETECTED Final   Mycoplasma pneumoniae NOT DETECTED NOT DETECTED Final    Comment: Performed at [facility], [place] [address], [place], [place] [address]  MRSA PCR Screening     Status: None   Collection Time: 09/14/18  6:50 PM    Result Value Ref Range Status   MRSA by PCR NEGATIVE NEGATIVE Final    Comment:        The GeneXpert MRSA Assay (FDA approved for NASAL specimens only), is one component of a comprehensive MRSA colonization surveillance program. It is not intended to diagnose MRSA infection nor to guide or monitor treatment for MRSA infections. Performed at [facility], [place] [address] [place], [place] [address]      Medications:   . calcium acetate  1,334 mg Oral TID WC  . cinacalcet  30 mg Oral Q breakfast  . enoxaparin (LOVENOX) injection  30 mg Subcutaneous Q24H  . feeding supplement (ENSURE ENLIVE)  237 mL Oral BID BM  . ferric citrate  630 mg Oral TID WC  . fluticasone  2 spray Each Nare Daily  .  loratadine  10 mg Oral Daily  . metoprolol tartrate  12.5 mg Oral BID  . multivitamin  1 tablet Oral QHS  . pantoprazole  40 mg Oral Daily   Continuous Infusions: . sodium chloride    . [START ON 09/16/2018] levofloxacin (LEVAQUIN) IV       LOS: 0 days   Charlynne Cousins  [facility]   *Please refer to [email or web address], password [email or web address] to get updated schedule on who will round on this patient, as hospitalists switch teams weekly. If 7PM-7AM, please contact night-coverage at [email or web address], password [email or web address] for any overnight needs.  09/15/2018, 9:48 AM

## 2018-09-16 LAB — BASIC METABOLIC PANEL
ANION GAP: 15 (ref 5–15)
BUN: 59 mg/dL — ABNORMAL HIGH (ref 6–20)
CALCIUM: 9.1 mg/dL (ref 8.9–10.3)
CO2: 21 mmol/L — ABNORMAL LOW (ref 22–32)
Chloride: 99 mmol/L (ref 98–111)
Creatinine, Ser: 12.61 mg/dL — ABNORMAL HIGH (ref 0.61–1.24)
GFR calc non Af Amer: 4 mL/min — ABNORMAL LOW (ref >60)
GFR, EST AFRICAN AMERICAN: 5 mL/min — AB (ref >60)
Glucose, Bld: 72 mg/dL (ref 70–99)
POTASSIUM: 4.9 mmol/L (ref 3.5–5.1)
Sodium: 135 mmol/L (ref 135–145)

## 2018-09-16 LAB — CBC
HCT: 46.5 % (ref 39.0–52.0)
Hemoglobin: 14.2 g/dL (ref 13.0–17.0)
MCH: 29 pg (ref 26.0–34.0)
MCHC: 30.5 g/dL (ref 30.0–36.0)
MCV: 94.9 fL (ref 80.0–100.0)
NRBC: 0 % (ref 0.0–0.2)
PLATELETS: 154 10*3/uL (ref 150–400)
RBC: 4.9 MIL/uL (ref 4.22–5.81)
RDW: 13.2 % (ref 11.5–15.5)
WBC: 8.2 10*3/uL (ref 4.0–10.5)

## 2018-09-16 MED ORDER — AZITHROMYCIN 500 MG PO TABS: 500.0000 mg | ORAL_TABLET | ORAL | 0 refills | Status: AC

## 2018-09-16 MED ORDER — CEFDINIR 300 MG PO CAPS: 300.0000 mg | ORAL_CAPSULE | ORAL | 0 refills | Status: AC

## 2018-09-16 MED ORDER — CHLORHEXIDINE GLUCONATE CLOTH 2 % EX PADS: 6.0000 | MEDICATED_PAD | Freq: Every day | CUTANEOUS | Status: DC

## 2018-09-16 NOTE — Progress Notes (Signed)
Patient ambulated 500 feet on room air. Patients oxygen saturation maintained between 91-96%. Patient had no complaints of shortness of breath. MD notified. Will continue to monitor.

## 2018-09-16 NOTE — Progress Notes (Addendum)
[facility] Progress Note   Dialysis Orders: TTS NW 4 hr 160 EDW 90 gets +/- EDW 2 K 2.25 Ca var Na left AVF heparin 5600 Hectorol 3 no ESA or Fe Binders 3 aurixya ac and sensipar 3 qod  Assessment/Plan: 1.  Fever - possible PNA - antibiotics per primary- azithro, ceftiraxone  2.  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  Hypertension/volume  - MTP 12.5 bid BP controlled usually runs SBP 100 - 115  4.  Anemia  - actually has chronic thrombocytosis  Requiring periodic phlebotomy - no ESA/Fe hgb 13.4  Lower than usual 5.  Metabolic bone disease -  On 3 aurixya and sensipar 30 qod/Hectorol 3 d/c calcium acetate - he is not taking calcium binders 6.  Nutrition - changed from heart to renal diet due to high K content of Heart Healthy diet 7.  Hx DVT 2015 -lovenox for DVT prophylaxis  Myriam Jacobson, PA-C [facility] Beeper [phone] 09/16/2018,9:55 AM  LOS: 1 day   Pt seen, examined and agree w A/P as above.  Kelly Splinter MD [employer] pager [phone]   09/16/2018, 2:23 PM    Subjective:   Eating some - has been up walking some  Throat still sore. Still doesn't feel quite back to normal.  Objective Vitals:   09/15/18 1628 09/15/18 2035 09/16/18 0501 09/16/18 0834  BP: 113/85 (!) 117/91 124/76 106/75  Pulse: 60 71 67 83  Resp: 18 20 20 18   Temp:  97.6 F (36.4 C) 97.6 F (36.4 C)   TempSrc:  Oral Oral   SpO2: 97% 96% 92%   Weight:      Height:       Physical Exam General: NAD  Heart: RRR Lungs: bilateral soft crackles Abdomen: soft NT +BS Extremities: no LE edema Dialysis Access: left lower AVF + bruit   Additional Objective Labs: Basic Metabolic Panel: Recent Labs  Lab 09/14/18 1233 09/14/18 1826 09/15/18 0539  NA 136  --  135  K 3.9  --  4.9  CL 94*  --  103  CO2 29  --  22  GLUCOSE 98  --  96  BUN 16  --  30*  CREATININE  6.85* 7.37* 8.82*  CALCIUM 9.2  --  8.8*   Liver Function Tests: Recent Labs  Lab 09/14/18 1233  AST 22  ALT 21  ALKPHOS 93  BILITOT 1.0  PROT 7.3  ALBUMIN 3.7   No results for input(s): LIPASE, AMYLASE in the last 168 hours. CBC: Recent Labs  Lab 09/14/18 1233 09/14/18 1826 09/15/18 0539  WBC 11.6* 9.4 9.2  NEUTROABS 10.3*  --   --   HGB 16.8 13.3 13.4  HCT 52.2* 42.3 41.9  MCV 92.2 95.7 92.5  PLT 179 157 136*   Blood Culture    Component Value Date/Time   SDES BLOOD RIGHT ANTECUBITAL 09/14/2018 1233   SDES BLOOD RIGHT HAND 09/14/2018 1233   SPECREQUEST  09/14/2018 1233    BOTTLES DRAWN AEROBIC AND ANAEROBIC Blood Culture adequate volume   SPECREQUEST  09/14/2018 1233    BOTTLES DRAWN AEROBIC AND ANAEROBIC Blood Culture adequate volume   CULT  09/14/2018 1233    NO GROWTH 2 DAYS Performed at [facility] [address], [place], [place] [address]    CULT  09/14/2018 1233    NO GROWTH  2 DAYS Performed at [facility], [place] [address] [place], [place] [address]    REPTSTATUS PENDING 09/14/2018 1233   REPTSTATUS PENDING 09/14/2018 1233    Cardiac Enzymes: No results for input(s): CKTOTAL, CKMB, CKMBINDEX, TROPONINI in the last 168 hours. CBG: No results for input(s): GLUCAP in the last 168 hours. Iron Studies: No results for input(s): IRON, TIBC, TRANSFERRIN, FERRITIN in the last 72 hours. Lab Results  Component Value Date   INR 1.08 04/20/2014   INR 1.24 04/04/2014   INR 1.51 (H) 04/02/2014   Studies/Results: Dg Chest 2 View  Result Date: 09/14/2018 CLINICAL DATA:  Fever EXAM: CHEST - 2 VIEW COMPARISON:  04/05/2014 FINDINGS: Mildly low lung volumes. Linear scarring or atelectasis at the bases. Heart size is normal. No pneumothorax. IMPRESSION: Low lung volumes with linear scarring or atelectasis at the left base. No focal pulmonary infiltrate. Electronically Signed   By: Donavan Foil M.D.   On: 09/14/2018 14:16   Ct Angio Chest Pe W Or Wo  Contrast  Result Date: 09/14/2018 CLINICAL DATA:  Chills, shortness of breath. History of end-stage renal disease on dialysis, deep vein thrombosis. EXAM: CT ANGIOGRAPHY CHEST WITH CONTRAST TECHNIQUE: Multidetector CT imaging of the chest was performed using the standard protocol during bolus administration of intravenous contrast. Multiplanar CT image reconstructions and MIPs were obtained to evaluate the vascular anatomy. CONTRAST:  22mL ISOVUE-370 IOPAMIDOL (ISOVUE-370) INJECTION 76% COMPARISON:  Chest radiograph September 14, 2018 FINDINGS: CARDIOVASCULAR: Adequate contrast opacification of the pulmonary artery's. Main pulmonary artery is not enlarged. No pulmonary arterial filling defects to the level of the subsegmental branches. Heart size is normal. Mild coronary artery calcification. No pericardial effusion. Thoracic aorta is normal course and caliber, trace calcific atherosclerosis descending aorta. MEDIASTINUM/NODES: No lymphadenopathy by CT size criteria. LUNGS/PLEURA: Tracheobronchial tree is patent, no pneumothorax. Patchy bibasilar and lingular subpleural consolidation with air bronchograms. No pleural effusion. Moderate centrilobular emphysema. UPPER ABDOMEN: Non-acute. Small hiatal hernia. Stable numerous renal cysts and liver cysts. MUSCULOSKELETAL: Non-acute. Mildly sclerotic axial skeleton seen with renal osteodystrophy. Review of the MIP images confirms the above findings. IMPRESSION: 1. No pulmonary embolism. 2. Bibasilar atelectasis, potential pneumonia. 3. Sequelae of polycystic kidney disease. Aortic Atherosclerosis (ICD10-I70.0) and Emphysema (ICD10-J43.9). Electronically Signed   By: Elon Alas M.D.   On: 09/14/2018 18:12   Ct Maxillofacial Wo Contrast  Result Date: 09/14/2018 CLINICAL DATA:  Chills with shortness of breath and sinus drainage for 1 month. Hemodialysis patient. EXAM: CT MAXILLOFACIAL WITHOUT CONTRAST TECHNIQUE: Multidetector CT imaging of the maxillofacial  structures was performed. Multiplanar CT image reconstructions were also generated. COMPARISON:  None. FINDINGS: Osseous: No evidence of acute fracture, dislocation or bone destruction. 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 The maxillary incisors are missing. There is some periodontal disease involving the maxillary lateral incisors. Orbits: The globes are intact. No inflammatory changes or foreign bodies are identified. The optic nerves and extraocular muscles appear normal. Sinuses: There is a small mucous retention cyst or polyp inferiorly in the left maxillary sinus. The visualized paranasal sinuses are otherwise clear without air-fluid levels. The mastoid air cells and middle ears are clear. Soft tissues: No inflammatory changes or focal fluid collections. Limited intracranial: Unremarkable. IMPRESSION: 1. No evidence of sinusitis or acute osseous findings. 2. The orbits appear normal. 3. 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 Electronically Signed   By: Caryl Comes.D.  On: 09/14/2018 18:05   Medications: . cefTRIAXone (ROCEPHIN)  IV 1 g (09/16/18 0926)   . azithromycin  500 mg Oral Q24H  . [START ON 09/17/2018] cinacalcet  30 mg Oral QODAY  . doxercalciferol  3 mcg Intravenous Q T,Th,Sa-HD  . enoxaparin (LOVENOX) injection  30 mg Subcutaneous Q24H  . feeding supplement (ENSURE ENLIVE)  237 mL Oral BID BM  . ferric citrate  630 mg Oral TID WC  . fluticasone  2 spray Each Nare Daily  . loratadine  10 mg Oral Daily  . metoprolol tartrate  12.5 mg Oral BID  . multivitamin  1 tablet Oral QHS  . pantoprazole  40 mg Oral Daily

## 2018-09-16 NOTE — Progress Notes (Signed)
[name] [name] to be D/C'd Home per MD order.  Discussed prescriptions and follow up appointments with the patient. Prescriptions given to patient, medication list explained in detail. Pt verbalized understanding.  Allergies as of 09/16/2018      Reactions   Fentanyl Nausea Only   Vancomycin Other (See Comments)   Red face      Medication List    STOP taking these medications   calcium acetate 667 MG capsule Commonly known as:  PHOSLO   enoxaparin 30 MG/0.3ML injection Commonly known as:  LOVENOX   feeding supplement (ENSURE COMPLETE) Liqd   metoprolol tartrate 25 MG tablet Commonly known as:  LOPRESSOR   warfarin 5 MG tablet Commonly known as:  COUMADIN     TAKE these medications   AURYXIA 1 GM 210 MG(Fe) tablet Generic drug:  ferric citrate Take 630 mg by mouth 3 (three) times daily with meals.   azithromycin 500 MG tablet Commonly known as:  ZITHROMAX Take 1 tablet (500 mg total) by mouth daily for 2 days. (starting 11/30) Start taking on:  09/17/2018   cefdinir 300 MG capsule Commonly known as:  OMNICEF Take 1 capsule (300 mg total) by mouth every other day for 4 days. (starting 11/30 after dialysis)   cetirizine 10 MG tablet Commonly known as:  ZYRTEC Take 10 mg by mouth daily as needed for allergies.   cinacalcet 30 MG tablet Commonly known as:  SENSIPAR Take 30 mg by mouth See admin instructions.   fluticasone 50 MCG/ACT nasal spray Commonly known as:  FLONASE Place 2 sprays into both nostrils daily as needed for allergies.   multivitamin Tabs tablet Take 1 tablet by mouth every morning.   pantoprazole 40 MG tablet Commonly known as:  PROTONIX Take 40 mg by mouth daily.       Vitals:   09/16/18 0501 09/16/18 0834  BP: 124/76 106/75  Pulse: 67 83  Resp: 20 18  Temp: 97.6 F (36.4 C)   SpO2: 92%     Skin clean, dry and intact without evidence of skin break down, no evidence of skin tears noted. IV catheter discontinued intact. Site without  signs and symptoms of complications. Dressing and pressure applied. Pt denies pain at this time. No complaints noted.  An After Visit Summary was printed and given to the patient. Patient escorted via [address], and D/C home via private auto.  Aneta Mins BSN, RN

## 2018-09-16 NOTE — Discharge Summary (Signed)
Physician Discharge Summary  [name] [name] TMH:[id number] DOB: [birth year] DOA: 09/14/2018  PCP: Patient, No Pcp Per  Admit date: 09/14/2018 Discharge date: 09/16/2018  Time spent: 35 minutes  Recommendations for Outpatient Follow-up:  1. Follow up outpatient CBC/CMP 2. Follow up with PCP/nephrology as outpatient 3. Consider repeat imaging 4. Follow final cultures 5. Ensure completion of abx and continued improvement 6. Consider further evaluation of dysphagia if this persists, but cleared by speech here   Discharge Diagnoses:  Active Problems:   Polycystic kidney disease   Essential hypertension, benign   End stage renal disease ([place])   Sepsis ([place])   CAP (community acquired pneumonia)   Discharge Condition: stable  Diet recommendation: renal diet  Filed Weights   09/14/18 1205 09/14/18 2159 09/16/18 1424  Weight: 90 kg 90 kg 92.6 kg    History of present illness:  [name] [name] is an 57 y.o. male past medical history of hypertension, polycystic kidney disease, thrombocytosis requiring phlebotomy treatments, DVT, who is status post anticoagulation for 3 months, end-stage renal disease on Tuesday Thursdays and Saturdays was sent from dialysis unit due to fever and chills, he relates he has had Isidro Monks productive cough on the day of admission had fever and chills at the dialysis center was noted to have Samanthajo Payano temperature of 102.  Hospital Course:  He was admitted for fever and noted to have findings concerning for pneumonia on CT scan.  His urine was notable for pyuria, but no culture was obtained.  He improved on abx to cover CAP.  He was narrowed to cefdinir and azithromycin on the day of discharge and discharged with outpatient follow up.  See below for further details    Sepsis [facility]): Last fever 11/27  Negative RVP No sputum collected, negative urine strep HIV negative. CTA of the chest done on 09/15/2018 showed no PE but possible pneumonia. Discharged on cefdinir and  azithromycin Consider follow up repeat CXR as outpatient  Dysphagia: noted some difficulty swallowing solid food today.  Did fine with pudding and liquid.  Speech notes clear for regular diet. Recommend f/u outpatient if persistent  Polycystic kidney disease/end-stage renal disease: Further management per nephrology.  Essential hypertension, benign: Pt no longer taking metop per our discussion  Hx DVT:  Previously on warfarin  Medications reviewed on discharge  Procedures:  none  Consultations:  renal  Discharge Exam: Vitals:   09/16/18 0501 09/16/18 0834  BP: 124/76 106/75  Pulse: 67 83  Resp: 20 18  Temp: 97.6 F (36.4 C)   SpO2: 92%    Feeling better. N[place] with d/c today. No SOB. Mom at bedside  General: No acute distress. Cardiovascular: Heart sounds show Grettell Ransdell regular rate, and rhythm. Lungs: Clear to auscultation bilaterally  Abdomen: Soft, nontender, nondistended  Neurological: Alert and oriented 3. Moves all extremities 4 . Cranial nerves II through XII grossly intact. Skin: Warm and dry. No rashes or lesions. Extremities: No clubbing or cyanosis. No edema.  Psychiatric: Mood and affect are normal. Insight and judgment are appropriate.  Discharge Instructions   Discharge Instructions    Call MD for:  difficulty breathing, headache or visual disturbances   Complete by:  As directed    Call MD for:  extreme fatigue   Complete by:  As directed    Call MD for:  persistant dizziness or light-headedness   Complete by:  As directed    Call MD for:  persistant nausea and vomiting   Complete by:  As directed  Call MD for:  redness, tenderness, or signs of infection (pain, swelling, redness, odor or green/yellow discharge around incision site)   Complete by:  As directed    Call MD for:  severe uncontrolled pain   Complete by:  As directed    Call MD for:  temperature >100.4   Complete by:  As directed    Diet - low sodium heart healthy   Complete by:   As directed    Diet renal 60/70-11-20-1198   Complete by:  As directed    Discharge instructions   Complete by:  As directed    You were seen for fever.   We've started you on antibiotics for pneumonia and you've improved.  We'll send you home with an additional 4 days of antibiotics.  Please follow up with your kidney doctor in [place] few days.   Return for new, recurrent, or worsening symptoms.  Please ask your PCP to request records from this hospitalization so they know what was done and what the next steps will be.   Increase activity slowly   Complete by:  As directed    Increase activity slowly   Complete by:  As directed      Medications reviewed at d/c.   Pt no longer taking metop, lovenox, warfarin, phoslo Allergies as of 09/16/2018      Reactions   Fentanyl Nausea Only   Vancomycin Other (See Comments)   Red face      Medication List    STOP taking these medications   calcium acetate 667 MG capsule Commonly known as:  PHOSLO   enoxaparin 30 MG/0.3ML injection Commonly known as:  LOVENOX   feeding supplement (ENSURE COMPLETE) Liqd   metoprolol tartrate 25 MG tablet Commonly known as:  LOPRESSOR   warfarin 5 MG tablet Commonly known as:  COUMADIN     TAKE these medications   AURYXIA 1 GM 210 MG(Fe) tablet Generic drug:  ferric citrate Take 630 mg by mouth 3 (three) times daily with meals.   azithromycin 500 MG tablet Commonly known as:  ZITHROMAX Take 1 tablet (500 mg total) by mouth daily for 2 days. (starting 11/30) Start taking on:  09/17/2018   cefdinir 300 MG capsule Commonly known as:  OMNICEF Take 1 capsule (300 mg total) by mouth every other day for 4 days. (starting 11/30 after dialysis)   cetirizine 10 MG tablet Commonly known as:  ZYRTEC Take 10 mg by mouth daily as needed for allergies.   cinacalcet 30 MG tablet Commonly known as:  SENSIPAR Take 30 mg by mouth See admin instructions.   fluticasone 50 MCG/ACT nasal spray Commonly  known as:  FLONASE Place 2 sprays into both nostrils daily as needed for allergies.   multivitamin Tabs tablet Take 1 tablet by mouth every morning.   pantoprazole 40 MG tablet Commonly known as:  PROTONIX Take 40 mg by mouth daily.      Allergies  Allergen Reactions  . Fentanyl Nausea Only  . Vancomycin Other (See Comments)    Red face   Follow-up Information    Jamal Maes, MD Follow up.   Specialty:  Nephrology Why:  Follow up with Dr. Lorrene Reid within [place]ilarie Sinha week Contact information: [place] [address] [phone]            The results of significant diagnostics from this hospitalization (including imaging, microbiology, ancillary and laboratory) are listed below for reference.    Significant Diagnostic Studies: Dg Chest 2 View  Result Date: 09/14/2018 CLINICAL DATA:  Fever EXAM: CHEST - 2 VIEW COMPARISON:  04/05/2014 FINDINGS: Mildly low lung volumes. Linear scarring or atelectasis at the bases. Heart size is normal. No pneumothorax. IMPRESSION: Low lung volumes with linear scarring or atelectasis at the left base. No focal pulmonary infiltrate. Electronically Signed   By: Donavan Foil M.D.   On: 09/14/2018 14:16   Ct Angio Chest Pe W Or Wo Contrast  Result Date: 09/14/2018 CLINICAL DATA:  Chills, shortness of breath. History of end-stage renal disease on dialysis, deep vein thrombosis. EXAM: CT ANGIOGRAPHY CHEST WITH CONTRAST TECHNIQUE: Multidetector CT imaging of the chest was performed using the standard protocol during bolus administration of intravenous contrast. Multiplanar CT image reconstructions and MIPs were obtained to evaluate the vascular anatomy. CONTRAST:  66mL ISOVUE-370 IOPAMIDOL (ISOVUE-370) INJECTION 76% COMPARISON:  Chest radiograph September 14, 2018 FINDINGS: CARDIOVASCULAR: Adequate contrast opacification of the pulmonary artery's. Main pulmonary artery is not enlarged. No pulmonary arterial filling defects to the level of the  subsegmental branches. Heart size is normal. Mild coronary artery calcification. No pericardial effusion. Thoracic aorta is normal course and caliber, trace calcific atherosclerosis descending aorta. MEDIASTINUM/NODES: No lymphadenopathy by CT size criteria. LUNGS/PLEURA: Tracheobronchial tree is patent, no pneumothorax. Patchy bibasilar and lingular subpleural consolidation with air bronchograms. No pleural effusion. Moderate centrilobular emphysema. UPPER ABDOMEN: Non-acute. Small hiatal hernia. Stable numerous renal cysts and liver cysts. MUSCULOSKELETAL: Non-acute. Mildly sclerotic axial skeleton seen with renal osteodystrophy. Review of the MIP images confirms the above findings. IMPRESSION: 1. No pulmonary embolism. 2. Bibasilar atelectasis, potential pneumonia. 3. Sequelae of polycystic kidney disease. Aortic Atherosclerosis (ICD10-I70.0) and Emphysema (ICD10-J43.9). Electronically Signed   By: Elon Alas M.D.   On: 09/14/2018 18:12   Ct Maxillofacial Wo Contrast  Result Date: 09/14/2018 CLINICAL DATA:  Chills with shortness of breath and sinus drainage for 1 month. Hemodialysis patient. EXAM: CT MAXILLOFACIAL WITHOUT CONTRAST TECHNIQUE: Multidetector CT imaging of the maxillofacial structures was performed. Multiplanar CT image reconstructions were also generated. COMPARISON:  None. FINDINGS: Osseous: No evidence of acute fracture, dislocation or bone destruction. 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 The maxillary incisors are missing. There is some periodontal disease involving the maxillary lateral incisors. Orbits: The globes are intact. No inflammatory changes or foreign bodies are identified. The optic nerves and extraocular muscles appear normal. Sinuses: There is Latanza Pfefferkorn small mucous retention cyst or polyp inferiorly in the left maxillary sinus. The visualized paranasal sinuses are otherwise clear without air-fluid levels. The mastoid air cells and  middle ears are clear. Soft tissues: No inflammatory changes or focal fluid collections. Limited intracranial: Unremarkable. IMPRESSION: 1. No evidence of sinusitis or acute osseous findings. 2. The orbits appear normal. 3. Incidental findings including Kortnee Bas small left maxillary sinus mucous retention cyst or polyp, central maxillary periodontal disease and left TMJ osteoarthritis. Electronically Signed   By: Richardean Sale M.D.   On: 09/14/2018 18:05    Microbiology: Recent Results (from the past 240 hour(s))  Blood Culture (routine x 2)     Status: None (Preliminary result)   Collection Time: 09/14/18 12:33 PM  Result Value Ref Range Status   Specimen Description BLOOD RIGHT ANTECUBITAL  Final   Special Requests   Final    BOTTLES DRAWN AEROBIC AND ANAEROBIC Blood Culture adequate volume   Culture   Final    NO GROWTH 2 DAYS Performed at [facility], [address] [place], [place] [address]    Report Status  PENDING  Incomplete  Blood Culture (routine x 2)     Status: None (Preliminary result)   Collection Time: 09/14/18 12:33 PM  Result Value Ref Range Status   Specimen Description BLOOD RIGHT HAND  Final   Special Requests   Final    BOTTLES DRAWN AEROBIC AND ANAEROBIC Blood Culture adequate volume   Culture   Final    NO GROWTH 2 DAYS Performed at [facility], [address] [place], [place] [address]    Report Status PENDING  Incomplete  Respiratory Panel by PCR     Status: None   Collection Time: 09/14/18  6:50 PM  Result Value Ref Range Status   Adenovirus NOT DETECTED NOT DETECTED Final   Coronavirus 229E NOT DETECTED NOT DETECTED Final   Coronavirus HKU1 NOT DETECTED NOT DETECTED Final   Coronavirus NL63 NOT DETECTED NOT DETECTED Final   Coronavirus OC43 NOT DETECTED NOT DETECTED Final   Metapneumovirus NOT DETECTED NOT DETECTED Final   Rhinovirus / Enterovirus NOT DETECTED NOT DETECTED Final   Influenza Tamirra Sienkiewicz NOT DETECTED NOT DETECTED Final   Influenza B NOT  DETECTED NOT DETECTED Final   Parainfluenza Virus 1 NOT DETECTED NOT DETECTED Final   Parainfluenza Virus 2 NOT DETECTED NOT DETECTED Final   Parainfluenza Virus 3 NOT DETECTED NOT DETECTED Final   Parainfluenza Virus 4 NOT DETECTED NOT DETECTED Final   Respiratory Syncytial Virus NOT DETECTED NOT DETECTED Final   Bordetella pertussis NOT DETECTED NOT DETECTED Final   Chlamydophila pneumoniae NOT DETECTED NOT DETECTED Final   Mycoplasma pneumoniae NOT DETECTED NOT DETECTED Final    Comment: Performed at [facility], [place] [address], [place], [place] [address]  MRSA PCR Screening     Status: None   Collection Time: 09/14/18  6:50 PM  Result Value Ref Range Status   MRSA by PCR NEGATIVE NEGATIVE Final    Comment:        The GeneXpert MRSA Assay (FDA approved for NASAL specimens only), is one component of Davi Rotan comprehensive MRSA colonization surveillance program. It is not intended to diagnose MRSA infection nor to guide or monitor treatment for MRSA infections. Performed at [facility], [place] [address], [place],  [address]      Labs: Basic Metabolic Panel: Recent Labs  Lab 09/14/18 1233 09/14/18 1826 09/15/18 0539 09/16/18 1333  NA 136  --  135 135  K 3.9  --  4.9 4.9  CL 94*  --  103 99  CO2 29  --  22 21*  GLUCOSE 98  --  96 72  BUN 16  --  30* 59*  CREATININE 6.85* 7.37* 8.82* 12.61*  CALCIUM 9.2  --  8.8* 9.1   Liver Function Tests: Recent Labs  Lab 09/14/18 1233  AST 22  ALT 21  ALKPHOS 93  BILITOT 1.0  PROT 7.3  ALBUMIN 3.7   No results for input(s): LIPASE, AMYLASE in the last 168 hours. No results for input(s): AMMONIA in the last 168 hours. CBC: Recent Labs  Lab 09/14/18 1233 09/14/18 1826 09/15/18 0539 09/16/18 1333  WBC 11.6* 9.4 9.2 8.2  NEUTROABS 10.3*  --   --   --   HGB 16.8 13.3 13.4 14.2  HCT 52.2* 42.3 41.9 46.5  MCV 92.2 95.7 92.5 94.9  PLT 179 157 136* 154   Cardiac Enzymes: No results for input(s): CKTOTAL,  CKMB, CKMBINDEX, TROPONINI in the last 168 hours. BNP: BNP (last 3 results) No results  for input(s): BNP in the last 8760 hours.  ProBNP (last 3 results) No results for input(s): PROBNP in the last 8760 hours.  CBG: No results for input(s): GLUCAP in the last 168 hours.     Signed:  Fayrene Helper MD.  [facility] 09/16/2018, 3:59 PM

## 2018-09-16 NOTE — Evaluation (Signed)
Clinical/Bedside Swallow Evaluation Patient Details  Name: [name] [name] MRN: [id number] Date of Birth: [birth year]  Today's Date: 09/16/2018 Time: SLP Start Time (ACUTE ONLY): 1425 SLP Stop Time (ACUTE ONLY): 1439 SLP Time Calculation (min) (ACUTE ONLY): 14 min  Past Medical History:  Past Medical History:  Diagnosis Date  . Anemia   . Chronic kidney disease   . DVT (deep venous thrombosis) ([place])   . Hypertension   . Leucocytosis   . Polycystic kidney disease   . Shortness of breath    Past Surgical History:  Past Surgical History:  Procedure Laterality Date  . AV FISTULA PLACEMENT Left 04/05/2014   Procedure: CREATION OF ARTERIOVENOUS (AV) FISTULA ;  Surgeon: Angelia Mould, MD;  Location: [place];  Service: Vascular;  Laterality: Left;  . CENTRAL VENOUS CATHETER INSERTION  04/02/2014   DR Titus Mould  . INSERTION OF DIALYSIS CATHETER Right 04/05/2014   Procedure: ULTRASOUND GUIDED INSERTION OF DIALYSIS CATHETER;  Surgeon: Angelia Mould, MD;  Location: [facility];  Service: Vascular;  Laterality: Right;  . NOSE SURGERY     HPI:  57 y.o. male past medical history of hypertension, polycystic kidney disease, thrombocytosis requiring phlebotomy treatments, DVT, who is status post anticoagulation for 3 months, end-stage renal disease with HD Tuesday Thursdays and Saturdays was sent from dialysis unit due to fever and chills. Dx sepsis due to possible pna. Swallow evaluation ordered secondary to pt c/o odynophagia.    Assessment / Plan / Recommendation Clinical Impression  Pt c/o acute odynophagia last two days after frequent coughing/throat-clearing in an effort to remove phlegm.  He demonstrated no s/s of dysphagia - there are no focal CN deficits; there is adequate mastication, brisk swallow response, no s/s of aspiration, no oral residue post-swallow.  Pt points to larynx as source of discomfort. Voice is clear/strong; cough normal.  Recommend continue current diet; thin liquids.   Instructed pt to contact MD/go to urgent care if discomfort does not improve in three-four days.  Pt verbalizes understanding.  No SLP f/u needed.  SLP Visit Diagnosis: Dysphagia, unspecified (R13.10)    Aspiration Risk  No limitations    Diet Recommendation   regular solids, thin liquids  Medication Administration: Whole meds with liquid    Other  Recommendations Oral Care Recommendations: Oral care BID   Follow up Recommendations None      Frequency and Duration            Prognosis        Swallow Study   General Date of Onset: 09/14/18 HPI: 57 y.o. male past medical history of hypertension, polycystic kidney disease, thrombocytosis requiring phlebotomy treatments, DVT, who is status post anticoagulation for 3 months, end-stage renal disease with HD Tuesday Thursdays and Saturdays was sent from dialysis unit due to fever and chills. Dx sepsis due to possible pna. Swallow evaluation ordered secondary to pt c/o odynophagia.  Type of Study: Bedside Swallow Evaluation Previous Swallow Assessment: no Diet Prior to this Study: Regular;Thin liquids Temperature Spikes Noted: No Respiratory Status: Room air History of Recent Intubation: No Behavior/Cognition: Alert;Cooperative Oral Cavity Assessment: Within Functional Limits Oral Care Completed by SLP: No Oral Cavity - Dentition: Adequate natural dentition Vision: Functional for self-feeding Self-Feeding Abilities: Able to feed self Patient Positioning: Upright in bed Baseline Vocal Quality: Normal Volitional Cough: Strong Volitional Swallow: Able to elicit    Oral/Motor/Sensory Function Overall Oral Motor/Sensory Function: Within functional limits   Ice Chips Ice chips: Within functional limits   Thin Liquid Thin  Liquid: Within functional limits    Nectar Thick Nectar Thick Liquid: Not tested   Honey Thick Honey Thick Liquid: Not tested   Puree Puree: Within functional limits   Solid     Solid: Within functional limits       Juan Quam Laurice 09/16/2018,2:43 PM   Estill Bamberg L. Tivis Ringer, [place] Office number [phone] Pager [phone]

## 2018-09-19 LAB — CULTURE, BLOOD (ROUTINE X 2)
CULTURE: NO GROWTH
Culture: NO GROWTH
SPECIAL REQUESTS: ADEQUATE
Special Requests: ADEQUATE

## 2019-03-30 ENCOUNTER — Encounter (HOSPITAL_BASED_OUTPATIENT_CLINIC_OR_DEPARTMENT_OTHER): Payer: Self-pay | Admitting: Emergency Medicine

## 2019-03-30 ENCOUNTER — Emergency Department (HOSPITAL_BASED_OUTPATIENT_CLINIC_OR_DEPARTMENT_OTHER)
Admission: EM | Admit: 2019-03-30 | Discharge: 2019-03-30 | Disposition: A | Discharge status: Home / Self Care | Payer: Medicare Other | Attending: Emergency Medicine | Admitting: Emergency Medicine

## 2019-03-30 DIAGNOSIS — Z23 Encounter for immunization: Secondary | ICD-10-CM | POA: Diagnosis not present

## 2019-03-30 DIAGNOSIS — S0181XA Laceration without foreign body of other part of head, initial encounter: Secondary | ICD-10-CM | POA: Diagnosis not present

## 2019-03-30 DIAGNOSIS — Y999 Unspecified external cause status: Secondary | ICD-10-CM | POA: Insufficient documentation

## 2019-03-30 DIAGNOSIS — W228XXA Striking against or struck by other objects, initial encounter: Secondary | ICD-10-CM | POA: Diagnosis not present

## 2019-03-30 DIAGNOSIS — Z87891 Personal history of nicotine dependence: Secondary | ICD-10-CM | POA: Insufficient documentation

## 2019-03-30 DIAGNOSIS — Z992 Dependence on renal dialysis: Secondary | ICD-10-CM | POA: Insufficient documentation

## 2019-03-30 DIAGNOSIS — Y929 Unspecified place or not applicable: Secondary | ICD-10-CM | POA: Diagnosis not present

## 2019-03-30 DIAGNOSIS — I120 Hypertensive chronic kidney disease with stage 5 chronic kidney disease or end stage renal disease: Secondary | ICD-10-CM | POA: Insufficient documentation

## 2019-03-30 DIAGNOSIS — N186 End stage renal disease: Secondary | ICD-10-CM | POA: Diagnosis not present

## 2019-03-30 DIAGNOSIS — Y939 Activity, unspecified: Secondary | ICD-10-CM | POA: Diagnosis not present

## 2019-03-30 MED ORDER — LIDOCAINE-EPINEPHRINE (PF) 2 %-1:200000 IJ SOLN
10.0000 mL | Freq: Once | INTRAMUSCULAR | Status: AC
  Administered 2019-03-30: 10 mL
  Filled 2019-03-30 (×2): qty 10

## 2019-03-30 MED ORDER — TETANUS-DIPHTH-ACELL PERTUSSIS 5-2.5-18.5 LF-MCG/0.5 IM SUSP
0.5000 mL | Freq: Once | INTRAMUSCULAR | Status: AC
  Administered 2019-03-30: 0.5 mL via INTRAMUSCULAR
  Filled 2019-03-30: qty 0.5

## 2019-03-30 NOTE — ED Triage Notes (Signed)
Pt has laceration to forehead. Pt will say how laceration occurred.

## 2019-03-30 NOTE — ED Provider Notes (Addendum)
[facility] EMERGENCY DEPARTMENT Provider Note   CSN: [id number] Arrival date & time: 03/30/19  9211     History   Chief Complaint Chief Complaint  Patient presents with  . Laceration    HPI [name] [name] is a 58 y.o. male.   The history is provided by the patient.  He has history of hypertension, end-stage renal disease on hemodialysis comes in having been struck on the forehead by a broom handle suffering a laceration.  He states he was momentarily stunned, but denies loss of consciousness.  He denies dizziness, visual change, nausea, vomiting.  He does not know when his last tetanus immunization was.  He denies other injury.  Past Medical History:  Diagnosis Date  . Anemia   . Chronic kidney disease   . DVT (deep venous thrombosis) ([place])   . Hypertension   . Leucocytosis   . Polycystic kidney disease   . Shortness of breath     Patient Active Problem List   Diagnosis Date Noted  . CAP (community acquired pneumonia) 09/15/2018  . Sepsis ([place]) 09/14/2018  . End stage renal disease ([place]) 05/23/2014  . Metabolic acidosis 94/17/4081  . Essential hypertension, benign 04/04/2014  . Acute encephalopathy 04/04/2014  . Polycystic kidney disease 04/02/2014  . Acute renal failure ([place]) 04/02/2014    Past Surgical History:  Procedure Laterality Date  . AV FISTULA PLACEMENT Left 04/05/2014   Procedure: CREATION OF ARTERIOVENOUS (AV) FISTULA ;  Surgeon: Angelia Mould, MD;  Location: [facility];  Service: Vascular;  Laterality: Left;  . CENTRAL VENOUS CATHETER INSERTION  04/02/2014   DR Titus Mould  . INSERTION OF DIALYSIS CATHETER Right 04/05/2014   Procedure: ULTRASOUND GUIDED INSERTION OF DIALYSIS CATHETER;  Surgeon: Angelia Mould, MD;  Location: [facility];  Service: Vascular;  Laterality: Right;  . NOSE SURGERY          Home Medications    Prior to Admission medications   Medication Sig Start Date End Date Taking? Authorizing Provider  cetirizine  (ZYRTEC) 10 MG tablet Take 10 mg by mouth daily as needed for allergies.    [provider]  cinacalcet (SENSIPAR) 30 MG tablet Take 30 mg by mouth See admin instructions.     [provider]  ferric citrate (AURYXIA) 1 GM 210 MG(Fe) tablet Take 630 mg by mouth 3 (three) times daily with meals.     [provider]  fluticasone (FLONASE) 50 MCG/ACT nasal spray Place 2 sprays into both nostrils daily as needed for allergies. 12/15/17   [provider]  multivitamin (RENA-VIT) TABS tablet Take 1 tablet by mouth every morning. 04/10/14   Ramiro Harvest, PA-C  pantoprazole (PROTONIX) 40 MG tablet Take 40 mg by mouth daily.    [provider]    Family History Family History  Problem Relation Age of Onset  . Polycystic kidney disease Father   . Hypertension Father   . Polycystic kidney disease Sister     Social History Social History   Tobacco Use  . Smoking status: Former Research scientist (life sciences)  . Smokeless tobacco: Never Used  . Tobacco comment: QUIT SMOKING CIGARETTES  IN 1994      QUIT SMOKING POT   Substance Use Topics  . Alcohol use: No  . Drug use: No     Allergies   Fentanyl and Vancomycin   Review of Systems Review of Systems  All other systems reviewed and are negative.    Physical Exam Updated Vital Signs BP (!) 161/96  Pulse 82   Temp 98.4 F (36.9 C) (Oral)   Resp 20   Ht 5' 8.5" (1.74 m)   Wt 88.5 kg   SpO2 100%   BMI 29.22 kg/m   Physical Exam Vitals signs and nursing note reviewed.    58 year old male, resting comfortably and in no acute distress. Vital signs are significant for elevated blood pressure. Oxygen saturation is 100%, which is normal. Head is normocephalic.  And there is a 5 cm laceration in the middle of the forehead oriented longitudinally. PERRLA, EOMI. Oropharynx is clear. Neck is nontender and supple without adenopathy or JVD. Back is nontender and there is no CVA tenderness. Lungs are clear without  rales, wheezes, or rhonchi. Chest is nontender. Heart has regular rate and rhythm without murmur. Abdomen is soft, flat, nontender without masses or hepatosplenomegaly and peristalsis is normoactive. Extremities have no cyanosis or edema, full range of motion is present. AV fistula present in left forearm with thrill present. Skin is warm and dry without Ace. Neurologic: Mental status is normal, cranial nerves are intact, there are no motor or sensory deficits.  ED Treatments / Results   Procedures .Marland KitchenLaceration Repair  Date/Time: 03/30/2019 3:40 AM Performed by: Delora Fuel, MD Authorized by: Delora Fuel, MD   Consent:    Consent obtained:  Verbal   Consent given by:  Patient   Risks discussed:  Infection, pain and poor cosmetic result   Alternatives discussed:  No treatment Anesthesia (see MAR for exact dosages):    Anesthesia method:  Local infiltration   Local anesthetic:  Lidocaine 2% WITH epi Laceration details:    Location:  Face   Face location:  Forehead   Length (cm):  5   Depth (mm):  3 Repair type:    Repair type:  Simple Pre-procedure details:    Preparation:  Patient was prepped and draped in usual sterile fashion Exploration:    Hemostasis achieved with:  Direct pressure   Wound exploration: entire depth of wound probed and visualized     Wound extent: no foreign bodies/material noted     Contaminated: no   Treatment:    Area cleansed with:  Saline   Amount of cleaning:  Standard Skin repair:    Repair method:  Sutures   Suture size:  5-0   Suture material:  Prolene   Number of sutures:  10 Approximation:    Approximation:  Close Post-procedure details:    Dressing:  Antibiotic ointment   Patient tolerance of procedure:  Tolerated well, no immediate complications     Medications Ordered in ED Medications  Tdap (BOOSTRIX) injection 0.5 mL (has no administration in time range)  lidocaine-EPINEPHrine (XYLOCAINE W/EPI) 2 %-1:200000 (PF) injection  10 mL (has no administration in time range)     Initial Impression / Assessment and Plan / ED Course  I have reviewed the triage vital signs and the nursing notes.  Forehead laceration treated with suturing.  Tdap booster is given.  No historical or physical exam findings to suggest significant head injury, no indication for imaging at this visit.  Advised to have sutures removed in 5 days.  Old records are reviewed, and he has no relevant past visits.  Final Clinical Impressions(s) / ED Diagnoses   Final diagnoses:  Forehead laceration, initial encounter    ED Discharge Orders    None       Delora Fuel, MD 35/36/14 4315    Delora Fuel, MD 40/08/67 [phone]

## 2019-03-30 NOTE — Discharge Instructions (Addendum)
If any signs of a head injury develop, return immediately so a CT scan can be done.

## 2019-04-04 ENCOUNTER — Encounter (HOSPITAL_BASED_OUTPATIENT_CLINIC_OR_DEPARTMENT_OTHER): Payer: Self-pay | Admitting: *Deleted

## 2019-04-04 ENCOUNTER — Emergency Department (HOSPITAL_BASED_OUTPATIENT_CLINIC_OR_DEPARTMENT_OTHER)
Admission: EM | Admit: 2019-04-04 | Discharge: 2019-04-04 | Disposition: A | Discharge status: Home / Self Care | Payer: Medicare Other | Attending: Emergency Medicine | Admitting: Emergency Medicine

## 2019-04-04 ENCOUNTER — Other Ambulatory Visit: Payer: Self-pay

## 2019-04-04 DIAGNOSIS — Z992 Dependence on renal dialysis: Secondary | ICD-10-CM | POA: Diagnosis not present

## 2019-04-04 DIAGNOSIS — W268XXD Contact with other sharp object(s), not elsewhere classified, subsequent encounter: Secondary | ICD-10-CM | POA: Insufficient documentation

## 2019-04-04 DIAGNOSIS — Z79899 Other long term (current) drug therapy: Secondary | ICD-10-CM | POA: Diagnosis not present

## 2019-04-04 DIAGNOSIS — N186 End stage renal disease: Secondary | ICD-10-CM | POA: Insufficient documentation

## 2019-04-04 DIAGNOSIS — Z87891 Personal history of nicotine dependence: Secondary | ICD-10-CM | POA: Diagnosis not present

## 2019-04-04 DIAGNOSIS — I120 Hypertensive chronic kidney disease with stage 5 chronic kidney disease or end stage renal disease: Secondary | ICD-10-CM | POA: Insufficient documentation

## 2019-04-04 DIAGNOSIS — Z7982 Long term (current) use of aspirin: Secondary | ICD-10-CM | POA: Diagnosis not present

## 2019-04-04 DIAGNOSIS — S0181XD Laceration without foreign body of other part of head, subsequent encounter: Secondary | ICD-10-CM | POA: Insufficient documentation

## 2019-04-04 DIAGNOSIS — Z4802 Encounter for removal of sutures: Secondary | ICD-10-CM | POA: Diagnosis present

## 2019-04-04 MED ORDER — BACITRACIN ZINC 500 UNIT/GM EX OINT
TOPICAL_OINTMENT | Freq: Once | CUTANEOUS | Status: AC
  Administered 2019-04-04: 13:00:00 via TOPICAL

## 2019-04-04 NOTE — ED Triage Notes (Signed)
Here for suture removal from his forehead.

## 2019-04-04 NOTE — Discharge Instructions (Signed)
Return to the emergency department for any fever, surrounding warmth, redness, drainage from the wound.

## 2019-04-04 NOTE — ED Provider Notes (Signed)
[facility] EMERGENCY DEPARTMENT Provider Note   CSN: [id number] Arrival date & time: 04/04/19  1235    History   Chief Complaint Chief Complaint  Patient presents with  . Suture / Staple Removal    HPI [name] [name] is a 58 y.o. male who presents for evaluation of suture removal.  Patient was seen here on 03/30/2019 for evaluation of head laceration.  Had sutures placed at that time.  Comes today for removal.  No fevers, redness or swelling around the wound.  He has not noticed any drainage from the wound.     The history is provided by the patient.    Past Medical History:  Diagnosis Date  . Anemia   . Chronic kidney disease   . DVT (deep venous thrombosis) ([place])   . Hypertension   . Leucocytosis   . Polycystic kidney disease   . Shortness of breath     Patient Active Problem List   Diagnosis Date Noted  . CAP (community acquired pneumonia) 09/15/2018  . Sepsis ([place]) 09/14/2018  . End stage renal disease ([place]) 05/23/2014  . Metabolic acidosis 24/23/5361  . Essential hypertension, benign 04/04/2014  . Acute encephalopathy 04/04/2014  . Polycystic kidney disease 04/02/2014  . Acute renal failure ([place]) 04/02/2014    Past Surgical History:  Procedure Laterality Date  . AV FISTULA PLACEMENT Left 04/05/2014   Procedure: CREATION OF ARTERIOVENOUS (AV) FISTULA ;  Surgeon: Angelia Mould, MD;  Location: [facility];  Service: Vascular;  Laterality: Left;  . CENTRAL VENOUS CATHETER INSERTION  04/02/2014   DR Titus Mould  . INSERTION OF DIALYSIS CATHETER Right 04/05/2014   Procedure: ULTRASOUND GUIDED INSERTION OF DIALYSIS CATHETER;  Surgeon: Angelia Mould, MD;  Location: [facility];  Service: Vascular;  Laterality: Right;  . NOSE SURGERY          Home Medications    Prior to Admission medications   Medication Sig Start Date End Date Taking? Authorizing Provider  aspirin (ASPIRIN 81) 81 MG chewable tablet Chew by mouth daily.   Yes [provider]   cinacalcet (SENSIPAR) 30 MG tablet Take 30 mg by mouth See admin instructions.    Yes [provider]  ferric citrate (AURYXIA) 1 GM 210 MG(Fe) tablet Take 630 mg by mouth 3 (three) times daily with meals.    Yes [provider]  multivitamin (RENA-VIT) TABS tablet Take 1 tablet by mouth every morning. 04/10/14  Yes Lyles, Charles, PA-C  pantoprazole (PROTONIX) 40 MG tablet Take 40 mg by mouth daily.   Yes [provider]  cetirizine (ZYRTEC) 10 MG tablet Take 10 mg by mouth daily as needed for allergies.    [provider]  fluticasone (FLONASE) 50 MCG/ACT nasal spray Place 2 sprays into both nostrils daily as needed for allergies. 12/15/17   [provider]    Family History Family History  Problem Relation Age of Onset  . Polycystic kidney disease Father   . Hypertension Father   . Polycystic kidney disease Sister     Social History Social History   Tobacco Use  . Smoking status: Former Research scientist (life sciences)  . Smokeless tobacco: Never Used  . Tobacco comment: QUIT SMOKING CIGARETTES  IN 1994      QUIT SMOKING POT   Substance Use Topics  . Alcohol use: No  . Drug use: No     Allergies   Fentanyl and Vancomycin   Review of Systems Review of Systems  Constitutional: Negative for fever.  Skin:  Positive for wound. Negative for color change.  All other systems reviewed and are negative.    Physical Exam Updated Vital Signs BP 111/79   Pulse 78   Temp 97.6 F (36.4 C) (Oral)   Resp 12   Ht 5\' 9"  (1.753 m)   Wt 90.1 kg   SpO2 98%   BMI 29.34 kg/m   Physical Exam Vitals signs and nursing note reviewed.  Constitutional:      Appearance: He is well-developed.  HENT:     Head: Normocephalic and atraumatic.     Comments: Scabbed over laceration noted to scalp.  No surrounding warmth, erythema, edema.  No drainage noted.  Sutures in place. Eyes:     General: No scleral icterus.       Right eye: No discharge.        Left eye: No  discharge.     Conjunctiva/sclera: Conjunctivae normal.  Pulmonary:     Effort: Pulmonary effort is normal.  Skin:    General: Skin is warm and dry.  Neurological:     Mental Status: He is alert.  Psychiatric:        Speech: Speech normal.        Behavior: Behavior normal.      ED Treatments / Results  Labs (all labs ordered are listed, but only abnormal results are displayed) Labs Reviewed - No data to display  EKG None  Radiology No results found.  Procedures .Suture Removal  Date/Time: 04/04/2019 1:18 PM Performed by: Volanda Napoleon, PA-C Authorized by: Volanda Napoleon, PA-C   Consent:    Consent obtained:  Verbal   Consent given by:  Patient   Risks discussed:  Bleeding, pain and wound separation Location:    Location:  Head/neck   Head/neck location:  Scalp Procedure details:    Wound appearance:  No signs of infection   Number of sutures removed:  10 Post-procedure details:    Post-removal:  Antibiotic ointment applied   Patient tolerance of procedure:  Tolerated well, no immediate complications   (including critical care time)  Medications Ordered in ED Medications  bacitracin ointment ( Topical Given 04/04/19 1325)     Initial Impression / Assessment and Plan / ED Course  I have reviewed the triage vital signs and the nursing notes.  Pertinent labs & imaging results that were available during my care of the patient were reviewed by me and considered in my medical decision making (see chart for details).        58 year old male who presents for evaluation of suture removal.  Sustained head laceration on 03/30/2019.  Wound shows no rounding infectious signs. Patient is afebrile, non-toxic appearing, sitting comfortably on examination table. Vital signs reviewed and stable.  Sutures removed. Patient had ample opportunity for questions and discussion. All patient's questions were answered with full understanding. Strict return precautions  discussed. Patient expresses understanding and agreement to plan.   Portions of this note were generated with [employer]. Dictation errors may occur despite best attempts at proofreading.   Final Clinical Impressions(s) / ED Diagnoses   Final diagnoses:  Visit for suture removal    ED Discharge Orders    None       Volanda Napoleon, PA-C 04/04/19 1336    Maudie Flakes, MD 04/04/19 1538

## 2019-05-16 ENCOUNTER — Encounter: Payer: Self-pay | Admitting: *Deleted

## 2019-05-16 DIAGNOSIS — Z86718 Personal history of other venous thrombosis and embolism: Secondary | ICD-10-CM | POA: Insufficient documentation

## 2019-05-17 ENCOUNTER — Other Ambulatory Visit: Payer: Self-pay | Admitting: *Deleted

## 2019-05-17 ENCOUNTER — Encounter: Payer: Self-pay | Admitting: Vascular Surgery

## 2019-05-17 ENCOUNTER — Ambulatory Visit (INDEPENDENT_AMBULATORY_CARE_PROVIDER_SITE_OTHER): Payer: Medicare Other | Admitting: Vascular Surgery

## 2019-05-17 ENCOUNTER — Other Ambulatory Visit: Payer: Self-pay

## 2019-05-17 ENCOUNTER — Encounter: Payer: Self-pay | Admitting: *Deleted

## 2019-05-17 VITALS — BP 132/74 | HR 72 | Temp 97.4°F | Resp 20 | Ht 69.0 in | Wt 201.9 lb

## 2019-05-17 DIAGNOSIS — Z992 Dependence on renal dialysis: Secondary | ICD-10-CM | POA: Diagnosis not present

## 2019-05-17 DIAGNOSIS — N186 End stage renal disease: Secondary | ICD-10-CM

## 2019-05-17 NOTE — Progress Notes (Signed)
   Patient name: [name] [name] MRN: [id number] DOB: May 04, 1961 Sex: male  REASON FOR VISIT:   To evaluate aneurysm overlying AV fistula.  The consult is requested by Dr. Jamal Maes.  HPI:   [name] [name] is a pleasant 58 y.o. male who has a left radiocephalic fistula which is had for many years.  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  For this reason he was sent for vascular consultation.  He dialyzes on Tuesdays Thursdays and Saturdays.  He denies any pain or paresthesias in his left arm.  Current Outpatient Medications  Medication Sig Dispense Refill  . aspirin (ASPIRIN 81) 81 MG chewable tablet Chew by mouth daily.    . cetirizine (ZYRTEC) 10 MG tablet Take 10 mg by mouth daily as needed for allergies.    . cinacalcet (SENSIPAR) 30 MG tablet Take 30 mg by mouth See admin instructions.     . ferric citrate (AURYXIA) 1 GM 210 MG(Fe) tablet Take 630 mg by mouth 3 (three) times daily with meals.     . fluticasone (FLONASE) 50 MCG/ACT nasal spray Place 2 sprays into both nostrils daily as needed for allergies.    . multivitamin (RENA-VIT) TABS tablet Take 1 tablet by mouth every morning. 30 tablet PRN  . pantoprazole (PROTONIX) 40 MG tablet Take 40 mg by mouth daily.     No current facility-administered medications for this visit.     REVIEW OF SYSTEMS:  [X]  denotes positive finding, [ ]  denotes negative finding Vascular    Leg swelling    Cardiac    Chest pain or chest pressure:    Shortness of breath upon exertion:    Short of breath when lying flat:    Irregular heart rhythm:    Constitutional    Fever or chills:     PHYSICAL EXAM:   Vitals:   05/17/19 1227  BP: 132/74  Pulse: 72  Resp: 20  Temp: (!) 97.4 F (36.3 C)  SpO2: 96%  Weight: 201 lb 14.4 oz (91.6 kg)  Height: 5\' 9"  (1.753 m)    GENERAL: The patient is a well-nourished male, in no acute distress. The vital signs are documented above. CARDIOVASCULAR: There is a  regular rate and rhythm. PULMONARY: There is good air exchange bilaterally without wheezing or rales. VASCULAR: His fistula has a good thrill.  He has 2 aneurysms.  The skin is starting to thin out over the aneurysms especially the more central 1.     DATA:   No new data  MEDICAL ISSUES:   END-STAGE RENAL DISEASE: I have recommended plication of the more central aneurysm.  We would have to address these in a staged fashion so that he would still have room for access above and below the plication.  We could potentially address the more distal aneurysm in a staged fashion.  I have discussed the indications of the procedure and the potential complications and he is agreeable to proceed.  This has been scheduled for 05/26/2019.  Deitra Mayo [facility] [phone]

## 2019-05-20 ENCOUNTER — Other Ambulatory Visit (HOSPITAL_COMMUNITY)
Admission: RE | Admit: 2019-05-20 | Discharge: 2019-05-20 | Disposition: A | Discharge status: Home / Self Care | Payer: Medicare Other | Source: Ambulatory Visit | Attending: Vascular Surgery | Admitting: Vascular Surgery

## 2019-05-20 DIAGNOSIS — Z01812 Encounter for preprocedural laboratory examination: Secondary | ICD-10-CM | POA: Diagnosis present

## 2019-05-20 DIAGNOSIS — Z20828 Contact with and (suspected) exposure to other viral communicable diseases: Secondary | ICD-10-CM | POA: Diagnosis not present

## 2019-05-20 LAB — SARS CORONAVIRUS 2 (TAT 6-24 HRS): SARS Coronavirus 2: NEGATIVE

## 2019-05-22 ENCOUNTER — Encounter (HOSPITAL_COMMUNITY): Payer: Self-pay | Admitting: *Deleted

## 2019-05-22 ENCOUNTER — Other Ambulatory Visit: Payer: Self-pay

## 2019-05-22 NOTE — Progress Notes (Signed)
Spoke with pt for pre-op call. Pt denies cardiac history or diabetes.   Pt had Covid test done on 05/20/19 and it was negative. Pt states he has been in quarantine since, will go to dialysis tomorrow, but will wear mask.   Coronavirus Screening  Have you experienced the following symptoms:  Cough  NO Fever (>100.51F)  NO Runny nose NO Sore throat NO Difficulty breathing/shortness of breath  NO  Have you or a family member traveled in the last 14 days and where? NO    Patient reminded that hospital visitation restrictions are in effect and the importance of the restrictions. Informed pt that he allowed to 1 visitor wait in the waiting room while he is in pre-op, surgery and PACU. Pt voiced understanding.

## 2019-05-23 NOTE — Anesthesia Preprocedure Evaluation (Addendum)
Anesthesia Evaluation  Patient identified by MRN, date of birth, ID band Patient awake    Reviewed: Allergy & Precautions, H&P , NPO status , Patient's Chart, lab work & pertinent test results  Airway Mallampati: II  TM Distance: >3 FB Neck ROM: Full    Dental no notable dental hx. (+) Teeth Intact, Dental Advisory Given   Pulmonary former smoker,    Pulmonary exam normal breath sounds clear to auscultation       Cardiovascular Exercise Tolerance: Good hypertension, negative cardio ROS Normal cardiovascular exam Rhythm:Regular Rate:Normal     Neuro/Psych negative neurological ROS  negative psych ROS   GI/Hepatic   Endo/Other    Renal/GU Dialysis and ESRFRenal disease     Musculoskeletal   Abdominal   Peds  Hematology   Anesthesia Other Findings   Reproductive/Obstetrics                            Lab Results  Component Value Date   WBC 8.2 09/16/2018   HGB 14.2 09/16/2018   HCT 46.5 09/16/2018   MCV 94.9 09/16/2018   PLT 154 09/16/2018   Lab Results  Component Value Date   CREATININE 12.61 (H) 09/16/2018   BUN 59 (H) 09/16/2018   NA 135 09/16/2018   K 4.9 09/16/2018   CL 99 09/16/2018   CO2 21 (L) 09/16/2018     Anesthesia Physical Anesthesia Plan  ASA: IV  Anesthesia Plan: General   Post-op Pain Management:    Induction: Intravenous  PONV Risk Score and Plan: 3 and Ondansetron, Treatment may vary due to age or medical condition and Dexamethasone  Airway Management Planned: LMA  Additional Equipment:   Intra-op Plan:   Post-operative Plan:   Informed Consent: I have reviewed the patients History and Physical, chart, labs and discussed the procedure including the risks, benefits and alternatives for the proposed anesthesia with the patient or authorized representative who has indicated his/her understanding and acceptance.     Dental advisory given  Plan  Discussed with: CRNA and Anesthesiologist  Anesthesia Plan Comments:       Anesthesia Quick Evaluation

## 2019-05-24 ENCOUNTER — Encounter (HOSPITAL_COMMUNITY)
Admission: RE | Disposition: A | Discharge status: Home / Self Care | Payer: Self-pay | Source: Home / Self Care | Attending: Vascular Surgery

## 2019-05-24 ENCOUNTER — Other Ambulatory Visit: Payer: Self-pay

## 2019-05-24 ENCOUNTER — Ambulatory Visit (HOSPITAL_COMMUNITY): Payer: Medicare Other | Admitting: Anesthesiology

## 2019-05-24 ENCOUNTER — Encounter (HOSPITAL_COMMUNITY): Payer: Self-pay

## 2019-05-24 ENCOUNTER — Ambulatory Visit (HOSPITAL_COMMUNITY)
Admission: RE | Admit: 2019-05-24 | Discharge: 2019-05-24 | Disposition: A | Discharge status: Home / Self Care | Payer: Medicare Other | Attending: Vascular Surgery | Admitting: Vascular Surgery

## 2019-05-24 DIAGNOSIS — Y832 Surgical operation with anastomosis, bypass or graft as the cause of abnormal reaction of the patient, or of later complication, without mention of misadventure at the time of the procedure: Secondary | ICD-10-CM | POA: Insufficient documentation

## 2019-05-24 DIAGNOSIS — N186 End stage renal disease: Secondary | ICD-10-CM

## 2019-05-24 DIAGNOSIS — I120 Hypertensive chronic kidney disease with stage 5 chronic kidney disease or end stage renal disease: Secondary | ICD-10-CM | POA: Insufficient documentation

## 2019-05-24 DIAGNOSIS — T82898A Other specified complication of vascular prosthetic devices, implants and grafts, initial encounter: Secondary | ICD-10-CM

## 2019-05-24 DIAGNOSIS — Z992 Dependence on renal dialysis: Secondary | ICD-10-CM

## 2019-05-24 DIAGNOSIS — Z87891 Personal history of nicotine dependence: Secondary | ICD-10-CM | POA: Diagnosis not present

## 2019-05-24 DIAGNOSIS — Z452 Encounter for adjustment and management of vascular access device: Secondary | ICD-10-CM | POA: Diagnosis present

## 2019-05-24 HISTORY — DX: Gastro-esophageal reflux disease without esophagitis: K21.9

## 2019-05-24 HISTORY — PX: FISTULA SUPERFICIALIZATION: SHX6341

## 2019-05-24 LAB — POCT I-STAT 4, (NA,K, GLUC, HGB,HCT)
Glucose, Bld: 84 mg/dL (ref 70–99)
HCT: 47 % (ref 39.0–52.0)
Hemoglobin: 16 g/dL (ref 13.0–17.0)
Potassium: 4.4 mmol/L (ref 3.5–5.1)
Sodium: 136 mmol/L (ref 135–145)

## 2019-05-24 SURGERY — FISTULA SUPERFICIALIZATION
Anesthesia: General | Site: Arm Lower | Laterality: Left

## 2019-05-24 MED ORDER — SODIUM CHLORIDE 0.9 % IV SOLN
INTRAVENOUS | Status: DC
  Administered 2019-05-24 (×2): via INTRAVENOUS

## 2019-05-24 MED ORDER — MIDAZOLAM HCL 2 MG/2ML IJ SOLN
INTRAMUSCULAR | Status: AC
  Filled 2019-05-24: qty 2

## 2019-05-24 MED ORDER — SODIUM CHLORIDE 0.9 % IV SOLN
INTRAVENOUS | Status: DC | PRN
  Administered 2019-05-24: 500 mL

## 2019-05-24 MED ORDER — PROPOFOL 500 MG/50ML IV EMUL
INTRAVENOUS | Status: DC | PRN
  Administered 2019-05-24: 50 ug/kg/min via INTRAVENOUS

## 2019-05-24 MED ORDER — PROPOFOL 10 MG/ML IV BOLUS
INTRAVENOUS | Status: DC | PRN
  Administered 2019-05-24: 150 mg via INTRAVENOUS
  Administered 2019-05-24: 50 mg via INTRAVENOUS

## 2019-05-24 MED ORDER — PAPAVERINE HCL 30 MG/ML IJ SOLN
INTRAMUSCULAR | Status: AC
  Filled 2019-05-24: qty 2

## 2019-05-24 MED ORDER — CHLORHEXIDINE GLUCONATE 4 % EX LIQD: 60.0000 mL | Freq: Once | CUTANEOUS | Status: DC

## 2019-05-24 MED ORDER — FENTANYL CITRATE (PF) 250 MCG/5ML IJ SOLN
INTRAMUSCULAR | Status: AC
  Filled 2019-05-24: qty 5

## 2019-05-24 MED ORDER — FENTANYL CITRATE (PF) 100 MCG/2ML IJ SOLN
INTRAMUSCULAR | Status: DC | PRN
  Administered 2019-05-24: 50 ug via INTRAVENOUS

## 2019-05-24 MED ORDER — HEPARIN SODIUM (PORCINE) 1000 UNIT/ML IJ SOLN
INTRAMUSCULAR | Status: DC | PRN
  Administered 2019-05-24: 5000 [IU] via INTRAVENOUS

## 2019-05-24 MED ORDER — SODIUM CHLORIDE 0.9 % IV SOLN
INTRAVENOUS | Status: AC
  Filled 2019-05-24: qty 1.2

## 2019-05-24 MED ORDER — 0.9 % SODIUM CHLORIDE (POUR BTL) OPTIME
TOPICAL | Status: DC | PRN
  Administered 2019-05-24: 1000 mL

## 2019-05-24 MED ORDER — LIDOCAINE 2% (20 MG/ML) 5 ML SYRINGE
INTRAMUSCULAR | Status: DC | PRN
  Administered 2019-05-24: 80 mg via INTRAVENOUS

## 2019-05-24 MED ORDER — LIDOCAINE-EPINEPHRINE (PF) 1 %-1:200000 IJ SOLN
INTRAMUSCULAR | Status: AC
  Filled 2019-05-24: qty 30

## 2019-05-24 MED ORDER — HEPARIN SODIUM (PORCINE) 1000 UNIT/ML IJ SOLN
INTRAMUSCULAR | Status: AC
  Filled 2019-05-24: qty 1

## 2019-05-24 MED ORDER — PROPOFOL 10 MG/ML IV BOLUS
INTRAVENOUS | Status: AC
  Filled 2019-05-24: qty 20

## 2019-05-24 MED ORDER — LIDOCAINE 2% (20 MG/ML) 5 ML SYRINGE
INTRAMUSCULAR | Status: AC
  Filled 2019-05-24: qty 5

## 2019-05-24 MED ORDER — EPHEDRINE SULFATE-NACL 50-0.9 MG/10ML-% IV SOSY
PREFILLED_SYRINGE | INTRAVENOUS | Status: DC | PRN
  Administered 2019-05-24: 10 mg via INTRAVENOUS

## 2019-05-24 MED ORDER — OXYCODONE HCL 5 MG PO TABS: 5.0000 mg | ORAL_TABLET | Freq: Four times a day (QID) | ORAL | 0 refills | Status: DC | PRN

## 2019-05-24 MED ORDER — CEFAZOLIN SODIUM-DEXTROSE 2-4 GM/100ML-% IV SOLN
2.0000 g | INTRAVENOUS | Status: AC
  Administered 2019-05-24: 09:00:00 2 g via INTRAVENOUS
  Filled 2019-05-24 (×2): qty 100

## 2019-05-24 MED ORDER — MIDAZOLAM HCL 5 MG/5ML IJ SOLN
INTRAMUSCULAR | Status: DC | PRN
  Administered 2019-05-24: 2 mg via INTRAVENOUS

## 2019-05-24 MED ORDER — ONDANSETRON HCL 4 MG/2ML IJ SOLN
INTRAMUSCULAR | Status: DC | PRN
  Administered 2019-05-24: 4 mg via INTRAVENOUS

## 2019-05-24 MED ORDER — LIDOCAINE-EPINEPHRINE (PF) 1 %-1:200000 IJ SOLN
INTRAMUSCULAR | Status: DC | PRN
  Administered 2019-05-24: 10 mL

## 2019-05-24 MED ORDER — PHENYLEPHRINE 40 MCG/ML (10ML) SYRINGE FOR IV PUSH (FOR BLOOD PRESSURE SUPPORT)
PREFILLED_SYRINGE | INTRAVENOUS | Status: DC | PRN
  Administered 2019-05-24 (×3): 80 ug via INTRAVENOUS

## 2019-05-24 MED ORDER — DEXAMETHASONE SODIUM PHOSPHATE 10 MG/ML IJ SOLN
INTRAMUSCULAR | Status: DC | PRN
  Administered 2019-05-24: 10 mg via INTRAVENOUS

## 2019-05-24 SURGICAL SUPPLY — 37 items
ARMBAND PINK RESTRICT EXTREMIT (MISCELLANEOUS) ×3 IMPLANT
BNDG ESMARK 4X9 LF (GAUZE/BANDAGES/DRESSINGS) ×3 IMPLANT
CANISTER SUCT 3000ML PPV (MISCELLANEOUS) ×3 IMPLANT
CLIP VESOCCLUDE MED 6/CT (CLIP) ×3 IMPLANT
CLIP VESOCCLUDE SM WIDE 6/CT (CLIP) ×3 IMPLANT
COVER PROBE W GEL 5X96 (DRAPES) ×3 IMPLANT
COVER WAND RF STERILE (DRAPES) ×3 IMPLANT
CUFF TOURN SGL QUICK 18X4 (TOURNIQUET CUFF) ×3 IMPLANT
DERMABOND ADVANCED (GAUZE/BANDAGES/DRESSINGS) ×2
DERMABOND ADVANCED .7 DNX12 (GAUZE/BANDAGES/DRESSINGS) ×1 IMPLANT
ELECT REM PT RETURN 9FT ADLT (ELECTROSURGICAL) ×3
ELECTRODE REM PT RTRN 9FT ADLT (ELECTROSURGICAL) ×1 IMPLANT
GLOVE BIO SURGEON STRL SZ 6.5 (GLOVE) ×4 IMPLANT
GLOVE BIO SURGEON STRL SZ7.5 (GLOVE) ×3 IMPLANT
GLOVE BIO SURGEONS STRL SZ 6.5 (GLOVE) ×2
GLOVE BIOGEL PI IND STRL 6.5 (GLOVE) ×1 IMPLANT
GLOVE BIOGEL PI IND STRL 7.0 (GLOVE) ×1 IMPLANT
GLOVE BIOGEL PI INDICATOR 6.5 (GLOVE) ×2
GLOVE BIOGEL PI INDICATOR 7.0 (GLOVE) ×2
GOWN STRL REUS W/ TWL LRG LVL3 (GOWN DISPOSABLE) ×1 IMPLANT
GOWN STRL REUS W/ TWL XL LVL3 (GOWN DISPOSABLE) ×2 IMPLANT
GOWN STRL REUS W/TWL LRG LVL3 (GOWN DISPOSABLE) ×2
GOWN STRL REUS W/TWL XL LVL3 (GOWN DISPOSABLE) ×4
KIT BASIN OR (CUSTOM PROCEDURE TRAY) ×3 IMPLANT
KIT TURNOVER KIT B (KITS) ×3 IMPLANT
NS IRRIG 1000ML POUR BTL (IV SOLUTION) ×3 IMPLANT
PACK CV ACCESS (CUSTOM PROCEDURE TRAY) ×3 IMPLANT
PAD ARMBOARD 7.5X6 YLW CONV (MISCELLANEOUS) ×6 IMPLANT
SUT MNCRL AB 4-0 PS2 18 (SUTURE) ×3 IMPLANT
SUT PROLENE 4 0 SH DA (SUTURE) ×3 IMPLANT
SUT PROLENE 4 0 TF (SUTURE) ×3 IMPLANT
SUT PROLENE 6 0 BV (SUTURE) ×3 IMPLANT
SUT VIC AB 3-0 SH 27 (SUTURE) ×2
SUT VIC AB 3-0 SH 27X BRD (SUTURE) ×1 IMPLANT
TOWEL GREEN STERILE (TOWEL DISPOSABLE) ×3 IMPLANT
UNDERPAD 30X30 (UNDERPADS AND DIAPERS) ×3 IMPLANT
WATER STERILE IRR 1000ML POUR (IV SOLUTION) ×3 IMPLANT

## 2019-05-24 NOTE — Anesthesia Postprocedure Evaluation (Signed)
Anesthesia Post Note  Patient: Layten Ourada  Procedure(s) Performed: PLICATION OF ANEURYSM ARTERIOVENOUS FISTULA LEFT ARM (Left Arm Lower)     Patient location during evaluation: PACU Anesthesia Type: General Level of consciousness: awake and alert Pain management: pain level controlled Vital Signs Assessment: post-procedure vital signs reviewed and stable Respiratory status: spontaneous breathing, nonlabored ventilation, respiratory function stable and patient connected to nasal cannula oxygen Cardiovascular status: blood pressure returned to baseline and stable Postop Assessment: no apparent nausea or vomiting Anesthetic complications: no    Last Vitals:  Vitals:   05/24/19 0937 05/24/19 0958  BP:  137/81  Pulse: 86 85  Resp: (!) 34 20  Temp:  (!) 36.1 C  SpO2: 98%     Last Pain:  Vitals:   05/24/19 0920  TempSrc:   PainSc: Ramsey A Chaslyn Eisen

## 2019-05-24 NOTE — Transfer of Care (Signed)
Immediate Anesthesia Transfer of Care Note  Patient: [name] [name]  Procedure(s) Performed: PLICATION OF ANEURYSM ARTERIOVENOUS FISTULA LEFT ARM (Left Arm Lower)  Patient Location: PACU  Anesthesia Type:General  Level of Consciousness: drowsy and patient cooperative  Airway & Oxygen Therapy: Patient Spontanous Breathing and Patient connected to face mask oxygen  Post-op Assessment: Report given to RN and Post -op Vital signs reviewed and stable  Post vital signs: Reviewed and stable  Last Vitals:  Vitals Value Taken Time  BP 106/87 05/24/19 0920  Temp    Pulse 96 05/24/19 0922  Resp 21 05/24/19 0922  SpO2 98 % 05/24/19 0922  Vitals shown include unvalidated device data.  Last Pain:  Vitals:   05/24/19 0727  TempSrc:   PainSc: 0-No pain         Complications: No apparent anesthesia complications

## 2019-05-24 NOTE — H&P (Signed)
   History and Physical Update  The patient was interviewed and re-examined.  The patient's previous History and Physical has been reviewed and is unchanged from recent office visit. Plan for plication of left arm avf.   Avani Sensabaugh C. Donzetta Matters, MD [facility] [place] Office: [phone] Pager: [phone]  05/24/2019, 8:21 AM

## 2019-05-24 NOTE — Discharge Instructions (Signed)
[facility]  Discharge Instructions  AV Fistula or Graft Surgery for Dialysis Access  Please refer to the following instructions for your post-procedure care. Your surgeon or physician assistant will discuss any changes with you.  Activity  You may drive the day following your surgery, if you are comfortable and no longer taking prescription pain medication. Resume full activity as the soreness in your incision resolves.  Bathing/Showering  You may shower after you go home. Keep your incision dry for 48 hours. Do not soak in a bathtub, hot tub, or swim until the incision heals completely. You may not shower if you have a hemodialysis catheter.  Incision Care  Clean your incision with mild soap and water after 48 hours. Pat the area dry with a clean towel. You do not need a bandage unless otherwise instructed. Do not apply any ointments or creams to your incision. You may have skin glue on your incision. Do not peel it off. It will come off on its own in about one week. Your arm may swell a bit after surgery. To reduce swelling use pillows to elevate your arm so it is above your heart. Your doctor will tell you if you need to lightly wrap your arm with an ACE bandage.  Diet  Resume your normal diet. There are not special food restrictions following this procedure. In order to heal from your surgery, it is CRITICAL to get adequate nutrition. Your body requires vitamins, minerals, and protein. Vegetables are the best source of vitamins and minerals. Vegetables also provide the perfect balance of protein. Processed food has little nutritional value, so try to avoid this.  Medications  Resume taking all of your medications. If your incision is causing pain, you may take over-the counter pain relievers such as acetaminophen (Tylenol). If you were prescribed a stronger pain medication, please be aware these medications can cause nausea and constipation. Prevent  nausea by taking the medication with a snack or meal. Avoid constipation by drinking plenty of fluids and eating foods with high amount of fiber, such as fruits, vegetables, and grains.  Do not take Tylenol if you are taking prescription pain medications.  Follow up Your surgeon may want to see you in the office following your access surgery. If so, this will be arranged at the time of your surgery.  Please call us immediately for any of the following conditions:  Increased pain, redness, drainage (pus) from your incision site Fever of 101 degrees or higher Severe or worsening pain at your incision site Hand pain or numbness.  Reduce your risk of vascular disease:  Stop smoking. If you would like help, call [facility] at 1-800-QUIT-NOW 772-088-0616) or [place] at [place] your cholesterol Maintain a desired weight Control your diabetes Keep your blood pressure down  Dialysis  It will take several weeks to several months for your new dialysis access to be ready for use. Your surgeon will determine when it is okay to use it. Your nephrologist will continue to direct your dialysis. You can continue to use your Permcath until your new access is ready for use.   05/24/2019 [name] [name] [name] [name] 20-May-1961  Surgeon(s): Waynetta Sandy, MD  Procedure(s): PLICATION OF ANEURYSM ARTERIOVENOUS FISTULA LEFT ARM  x May stick graft on designated area only:  Do NOT stick over incision x 6 weeks. May stick above and below incision immediately.   If you have any questions, please call the office at [phone].

## 2019-05-24 NOTE — Anesthesia Procedure Notes (Signed)
Procedure Name: LMA Insertion Date/Time: 05/24/2019 8:32 AM Performed by: Gwyndolyn Saxon, CRNA Pre-anesthesia Checklist: Patient identified, Emergency Drugs available, Suction available and Patient being monitored Patient Re-evaluated:Patient Re-evaluated prior to induction Oxygen Delivery Method: Circle system utilized Preoxygenation: Pre-oxygenation with 100% oxygen Induction Type: IV induction Ventilation: Mask ventilation without difficulty LMA: LMA inserted LMA Size: 4.0 Number of attempts: 1 Placement Confirmation: positive ETCO2 and breath sounds checked- equal and bilateral Tube secured with: Tape Dental Injury: Teeth and Oropharynx as per pre-operative assessment

## 2019-05-24 NOTE — Op Note (Signed)
    Patient name: [name] [name] MRN: [id number] DOB: 1961/09/02 Sex: male  05/24/2019 Pre-operative Diagnosis: End-stage renal disease, pseudoaneurysm left arm AV fistula Post-operative diagnosis:  Same Surgeon:  Eda Paschal. Donzetta Matters, MD Assistant: Leontine Locket, PA Procedure Performed: Revision left arm AV fistula with plication of pseudoaneurysm  Indications: 58 year old male on dialysis via left arm AV fistula.  There are 2 areas of pseudoaneurysmal degeneration.  He is indicated for repair of 1 set the fistula will still be suitable for access.  Findings: Large pseudoaneurysm was primarily repaired skin was easily closed over top there was thrill at completion.   Procedure:  The patient was identified in the holding area and taken to the operating room was placed supine operative table LMA anesthesia was induced antibiotics administered timeout was called.  1% lidocaine was used to anesthetize the area around the pseudoaneurysm of interest.  5000 use of heparin was administered.  Esmarch was used to exsanguinate the left arm tourniquet was inflated to 150 mmHg above the elbow.  Elliptical incision was made around the pseudoaneurysm we excised the superficial part of the pseudoaneurysmal cavity.  Did mobilize the edges back 1 cm.  He pseudoaneurysm was closed with running 4-0 Prolene suture in a mattress fashion.  Prior to completion we did flushed with heparinized saline.  Tourniquet was then allowed down.  We mobilized the soft tissue obtain hemostasis irrigated the wound closed in layers with interrupted Vicryl followed by 4 Monocryl.  Dermabond placed to level the skin.  He was awakened from anesthesia having tolerated procedure without immediate complication.  All counts were correct at completion.  EBL: 10 cc   Gad Aymond C. Donzetta Matters, MD [facility] [place] Office: [phone] Pager: [phone]

## 2019-05-25 ENCOUNTER — Encounter (HOSPITAL_COMMUNITY): Payer: Self-pay | Admitting: Vascular Surgery

## 2019-06-30 ENCOUNTER — Encounter: Payer: Self-pay | Admitting: *Deleted

## 2019-06-30 ENCOUNTER — Other Ambulatory Visit: Payer: Self-pay

## 2019-06-30 ENCOUNTER — Encounter: Payer: Self-pay | Admitting: Vascular Surgery

## 2019-06-30 ENCOUNTER — Ambulatory Visit (INDEPENDENT_AMBULATORY_CARE_PROVIDER_SITE_OTHER): Payer: Self-pay | Admitting: Vascular Surgery

## 2019-06-30 VITALS — BP 122/80 | HR 65 | Resp 20 | Ht 69.0 in | Wt 197.8 lb

## 2019-06-30 DIAGNOSIS — N186 End stage renal disease: Secondary | ICD-10-CM

## 2019-06-30 NOTE — Progress Notes (Signed)
Patient ID: [name] [name], male   DOB: November 12, 1960, 58 y.o.   MRN: [id number]  Reason for Consult: Post-op Follow-up   Referred by No ref. provider found  Subjective:     HPI:  [name] [name] is a 58 y.o. male with end-stage renal disease on dialysis Tuesday Thursdays and Saturdays.  He is recently undergone revision of one area of pseudoaneurysm on his arm.  This has healed well he did not require any pain medicine.  Continues to dialyze have actually been cannulating the site now.  He is not having any issues related to the recent surgery.  Does have 1 further area of skin tenting that he is somewhat concerned about.  He is on aspirin does not take blood thinners.  Past Medical History:  Diagnosis Date  . Anemia   . Chronic kidney disease   . DVT (deep venous thrombosis) ([place])   . GERD (gastroesophageal reflux disease)   . Hypertension   . Leucocytosis   . Polycystic kidney disease   . Shortness of breath    Family History  Problem Relation Age of Onset  . Polycystic kidney disease Father   . Hypertension Father   . Polycystic kidney disease Sister    Past Surgical History:  Procedure Laterality Date  . AV FISTULA PLACEMENT Left 04/05/2014   Procedure: CREATION OF ARTERIOVENOUS (AV) FISTULA ;  Surgeon: Angelia Mould, MD;  Location: [facility];  Service: Vascular;  Laterality: Left;  . CENTRAL VENOUS CATHETER INSERTION  04/02/2014   DR Titus Mould  . FISTULA SUPERFICIALIZATION Left 03/19/6072   Procedure: PLICATION OF ANEURYSM ARTERIOVENOUS FISTULA LEFT ARM;  Surgeon: Waynetta Sandy, MD;  Location: [facility];  Service: Vascular;  Laterality: Left;  . INSERTION OF DIALYSIS CATHETER Right 04/05/2014   Procedure: ULTRASOUND GUIDED INSERTION OF DIALYSIS CATHETER;  Surgeon: Angelia Mould, MD;  Location: [facility];  Service: Vascular;  Laterality: Right;  . NOSE SURGERY      Short Social History:  Social History   Tobacco Use  . Smoking status: Former Research scientist (life sciences)  . Smokeless  tobacco: Never Used  . Tobacco comment: QUIT SMOKING CIGARETTES  IN 1994      QUIT SMOKING POT   Substance Use Topics  . Alcohol use: No    Allergies  Allergen Reactions  . Propofol Nausea And Vomiting  . Fentanyl Nausea Only  . Vancomycin Other (See Comments)    Red face ? Red Man Syndrome ?    Current Outpatient Medications  Medication Sig Dispense Refill  . acetaminophen (TYLENOL) 500 MG tablet Take 500-1,000 mg by mouth every 6 (six) hours as needed for moderate pain.    Marland Kitchen aspirin EC 81 MG tablet Take 81 mg by mouth every evening.    . cetirizine (ZYRTEC) 10 MG tablet Take 10 mg by mouth daily as needed for allergies.    . cinacalcet (SENSIPAR) 30 MG tablet Take 30 mg by mouth every evening.     . ferric citrate (AURYXIA) 1 GM 210 MG(Fe) tablet Take 210-630 mg by mouth 3 (three) times daily with meals. Take 630 mg with each meal and 210 - 420 mg with each snack    . fluticasone (FLONASE) 50 MCG/ACT nasal spray Place 2 sprays into both nostrils daily as needed for allergies.    . Multiple Vitamins-Minerals (MULTIVITAMIN ADULT EXTRA C PO) Take by mouth.    . multivitamin (RENA-VIT) TABS tablet Take 1 tablet by mouth every morning. (Patient taking differently: Take 1 tablet by  mouth every evening. ) 30 tablet PRN  . pantoprazole (PROTONIX) 40 MG tablet Take 40 mg by mouth every evening.      No current facility-administered medications for this visit.     Review of Systems  Constitutional:  Constitutional negative. HENT: HENT negative.  Eyes: Eyes negative.  Respiratory: Respiratory negative.  Cardiovascular: Cardiovascular negative.  GI: Gastrointestinal negative.  Musculoskeletal: Musculoskeletal negative.  Skin: Skin negative.  Neurological: Neurological negative. Hematologic: Hematologic/lymphatic negative.  Psychiatric: Psychiatric negative.        Objective:  Objective   Vitals:   06/30/19 0824  BP: 122/80  Pulse: 65  Resp: 20  SpO2: 96%  Weight: 197 lb  12.8 oz (89.7 kg)  Height: 5\' 9"  (1.753 m)   Body mass index is 29.21 kg/m.  Physical Exam HENT:     Head: Normocephalic.  Eyes:     Pupils: Pupils are equal, round, and reactive to light.  Cardiovascular:     Rate and Rhythm: Normal rate.     Pulses:          Radial pulses are 2+ on the right side and 2+ on the left side.  Pulmonary:     Effort: Pulmonary effort is normal.  Musculoskeletal: Normal range of motion.        General: No swelling.     Comments: Left arm palpable thrill well-healed wound 1 area of pseudoaneurysmal degeneration of left arm fistula with thin skin overlying  Neurological:     Mental Status: He is alert.     Data: No studies today     Assessment/Plan:     58 year old male status post revision left arm radiocephalic AV fistula.  Does have 1 area of thinning skin with a large pseudoaneurysm.  We can revise this in the near future.  I discussed risk benefits alternatives he agrees to proceed.     Waynetta Sandy MD [facility]

## 2019-07-06 ENCOUNTER — Other Ambulatory Visit: Payer: Self-pay | Admitting: *Deleted

## 2019-07-17 ENCOUNTER — Other Ambulatory Visit (HOSPITAL_COMMUNITY)
Admission: RE | Admit: 2019-07-17 | Discharge: 2019-07-17 | Disposition: A | Discharge status: Home / Self Care | Payer: Medicare Other | Source: Ambulatory Visit | Attending: Vascular Surgery | Admitting: Vascular Surgery

## 2019-07-17 DIAGNOSIS — Z20828 Contact with and (suspected) exposure to other viral communicable diseases: Secondary | ICD-10-CM | POA: Insufficient documentation

## 2019-07-17 DIAGNOSIS — Z01812 Encounter for preprocedural laboratory examination: Secondary | ICD-10-CM | POA: Diagnosis present

## 2019-07-18 ENCOUNTER — Other Ambulatory Visit: Payer: Self-pay

## 2019-07-18 ENCOUNTER — Encounter (HOSPITAL_COMMUNITY): Payer: Self-pay | Admitting: *Deleted

## 2019-07-18 LAB — NOVEL CORONAVIRUS, NAA (HOSP ORDER, SEND-OUT TO REF LAB; TAT 18-24 HRS): SARS-CoV-2, NAA: NOT DETECTED

## 2019-07-18 MED ORDER — PROMETHAZINE HCL 25 MG/ML IJ SOLN: 6.2500 mg | INTRAMUSCULAR | Status: DC | PRN

## 2019-07-18 MED ORDER — OXYCODONE HCL 5 MG/5ML PO SOLN: 5.0000 mg | Freq: Once | ORAL | Status: DC | PRN

## 2019-07-18 MED ORDER — OXYCODONE HCL 5 MG PO TABS: 5.0000 mg | ORAL_TABLET | Freq: Once | ORAL | Status: DC | PRN

## 2019-07-18 MED ORDER — HYDROMORPHONE HCL 1 MG/ML IJ SOLN: 0.2500 mg | INTRAMUSCULAR | Status: DC | PRN

## 2019-07-18 MED ORDER — ACETAMINOPHEN 10 MG/ML IV SOLN: 1000.0000 mg | Freq: Once | INTRAVENOUS | Status: DC | PRN

## 2019-07-18 NOTE — Progress Notes (Addendum)
Mr. Dace denies chest pain or shortness of breath. Patient was testednegativefor Covid 9/28/2020and has been in quarantine since that time, except to go to dialysis with wearing a mask.

## 2019-07-19 ENCOUNTER — Encounter (HOSPITAL_COMMUNITY)
Admission: RE | Disposition: A | Discharge status: Home / Self Care | Payer: Self-pay | Source: Home / Self Care | Attending: Vascular Surgery

## 2019-07-19 ENCOUNTER — Encounter (HOSPITAL_COMMUNITY): Payer: Self-pay

## 2019-07-19 ENCOUNTER — Ambulatory Visit (HOSPITAL_COMMUNITY): Payer: Medicare Other | Admitting: Certified Registered Nurse Anesthetist

## 2019-07-19 ENCOUNTER — Ambulatory Visit (HOSPITAL_COMMUNITY)
Admission: RE | Admit: 2019-07-19 | Discharge: 2019-07-19 | Disposition: A | Discharge status: Home / Self Care | Payer: Medicare Other | Attending: Vascular Surgery | Admitting: Vascular Surgery

## 2019-07-19 ENCOUNTER — Other Ambulatory Visit: Payer: Self-pay

## 2019-07-19 DIAGNOSIS — I120 Hypertensive chronic kidney disease with stage 5 chronic kidney disease or end stage renal disease: Secondary | ICD-10-CM | POA: Diagnosis not present

## 2019-07-19 DIAGNOSIS — Z7982 Long term (current) use of aspirin: Secondary | ICD-10-CM | POA: Diagnosis not present

## 2019-07-19 DIAGNOSIS — Z87891 Personal history of nicotine dependence: Secondary | ICD-10-CM | POA: Insufficient documentation

## 2019-07-19 DIAGNOSIS — Z881 Allergy status to other antibiotic agents status: Secondary | ICD-10-CM | POA: Diagnosis not present

## 2019-07-19 DIAGNOSIS — Z884 Allergy status to anesthetic agent status: Secondary | ICD-10-CM | POA: Diagnosis not present

## 2019-07-19 DIAGNOSIS — K219 Gastro-esophageal reflux disease without esophagitis: Secondary | ICD-10-CM | POA: Insufficient documentation

## 2019-07-19 DIAGNOSIS — Z992 Dependence on renal dialysis: Secondary | ICD-10-CM

## 2019-07-19 DIAGNOSIS — T82898A Other specified complication of vascular prosthetic devices, implants and grafts, initial encounter: Secondary | ICD-10-CM | POA: Insufficient documentation

## 2019-07-19 DIAGNOSIS — Z86718 Personal history of other venous thrombosis and embolism: Secondary | ICD-10-CM | POA: Diagnosis not present

## 2019-07-19 DIAGNOSIS — Z79899 Other long term (current) drug therapy: Secondary | ICD-10-CM | POA: Insufficient documentation

## 2019-07-19 DIAGNOSIS — Q613 Polycystic kidney, unspecified: Secondary | ICD-10-CM | POA: Diagnosis not present

## 2019-07-19 DIAGNOSIS — D631 Anemia in chronic kidney disease: Secondary | ICD-10-CM | POA: Insufficient documentation

## 2019-07-19 DIAGNOSIS — N186 End stage renal disease: Secondary | ICD-10-CM

## 2019-07-19 DIAGNOSIS — Z885 Allergy status to narcotic agent status: Secondary | ICD-10-CM | POA: Insufficient documentation

## 2019-07-19 HISTORY — DX: End stage renal disease: N18.6

## 2019-07-19 HISTORY — PX: REVISON OF ARTERIOVENOUS FISTULA: SHX6074

## 2019-07-19 HISTORY — DX: Anxiety disorder, unspecified: F41.9

## 2019-07-19 LAB — POCT I-STAT, CHEM 8
BUN: 37 mg/dL — ABNORMAL HIGH (ref 6–20)
Calcium, Ion: 1.02 mmol/L — ABNORMAL LOW (ref 1.15–1.40)
Chloride: 96 mmol/L — ABNORMAL LOW (ref 98–111)
Creatinine, Ser: 10.6 mg/dL — ABNORMAL HIGH (ref 0.61–1.24)
Glucose, Bld: 83 mg/dL (ref 70–99)
HCT: 44 % (ref 39.0–52.0)
Hemoglobin: 15 g/dL (ref 13.0–17.0)
Potassium: 4.2 mmol/L (ref 3.5–5.1)
Sodium: 137 mmol/L (ref 135–145)
TCO2: 30 mmol/L (ref 22–32)

## 2019-07-19 SURGERY — REVISON OF ARTERIOVENOUS FISTULA
Anesthesia: General | Site: Arm Lower | Laterality: Left

## 2019-07-19 MED ORDER — MIDAZOLAM HCL 2 MG/2ML IJ SOLN
INTRAMUSCULAR | Status: AC
  Filled 2019-07-19: qty 2

## 2019-07-19 MED ORDER — DEXAMETHASONE SODIUM PHOSPHATE 10 MG/ML IJ SOLN
INTRAMUSCULAR | Status: DC | PRN
  Administered 2019-07-19: 4 mg via INTRAVENOUS

## 2019-07-19 MED ORDER — LIDOCAINE-EPINEPHRINE (PF) 1 %-1:200000 IJ SOLN
INTRAMUSCULAR | Status: DC | PRN
  Administered 2019-07-19: 8 mL

## 2019-07-19 MED ORDER — DEXAMETHASONE SODIUM PHOSPHATE 10 MG/ML IJ SOLN
INTRAMUSCULAR | Status: AC
  Filled 2019-07-19: qty 1

## 2019-07-19 MED ORDER — MIDAZOLAM HCL 2 MG/2ML IJ SOLN
INTRAMUSCULAR | Status: DC | PRN
  Administered 2019-07-19: 2 mg via INTRAVENOUS

## 2019-07-19 MED ORDER — ONDANSETRON HCL 4 MG/2ML IJ SOLN
INTRAMUSCULAR | Status: AC
  Filled 2019-07-19: qty 2

## 2019-07-19 MED ORDER — FENTANYL CITRATE (PF) 100 MCG/2ML IJ SOLN
INTRAMUSCULAR | Status: DC | PRN
  Administered 2019-07-19: 50 ug via INTRAVENOUS

## 2019-07-19 MED ORDER — SODIUM CHLORIDE 0.9 % IV SOLN
INTRAVENOUS | Status: DC | PRN
  Administered 2019-07-19: 500 mL

## 2019-07-19 MED ORDER — PHENYLEPHRINE 40 MCG/ML (10ML) SYRINGE FOR IV PUSH (FOR BLOOD PRESSURE SUPPORT)
PREFILLED_SYRINGE | INTRAVENOUS | Status: DC | PRN
  Administered 2019-07-19: 120 ug via INTRAVENOUS
  Administered 2019-07-19: 80 ug via INTRAVENOUS

## 2019-07-19 MED ORDER — SODIUM CHLORIDE 0.9 % IV SOLN
INTRAVENOUS | Status: AC
  Filled 2019-07-19: qty 1.2

## 2019-07-19 MED ORDER — CEFAZOLIN SODIUM-DEXTROSE 2-4 GM/100ML-% IV SOLN
2.0000 g | INTRAVENOUS | Status: AC
  Administered 2019-07-19: 08:00:00 2 g via INTRAVENOUS

## 2019-07-19 MED ORDER — PROTAMINE SULFATE 10 MG/ML IV SOLN
INTRAVENOUS | Status: AC
  Filled 2019-07-19: qty 5

## 2019-07-19 MED ORDER — LIDOCAINE-EPINEPHRINE (PF) 1 %-1:200000 IJ SOLN
INTRAMUSCULAR | Status: AC
  Filled 2019-07-19: qty 30

## 2019-07-19 MED ORDER — PROPOFOL 10 MG/ML IV BOLUS
INTRAVENOUS | Status: AC
  Filled 2019-07-19: qty 40

## 2019-07-19 MED ORDER — HEPARIN SODIUM (PORCINE) 1000 UNIT/ML IJ SOLN
INTRAMUSCULAR | Status: DC | PRN
  Administered 2019-07-19: 3000 [IU] via INTRAVENOUS

## 2019-07-19 MED ORDER — SODIUM CHLORIDE 0.9 % IV SOLN
INTRAVENOUS | Status: DC
  Administered 2019-07-19: 07:00:00 via INTRAVENOUS

## 2019-07-19 MED ORDER — HEPARIN SODIUM (PORCINE) 1000 UNIT/ML IJ SOLN
INTRAMUSCULAR | Status: AC
  Filled 2019-07-19: qty 1

## 2019-07-19 MED ORDER — LIDOCAINE 2% (20 MG/ML) 5 ML SYRINGE
INTRAMUSCULAR | Status: AC
  Filled 2019-07-19: qty 5

## 2019-07-19 MED ORDER — PROPOFOL 10 MG/ML IV BOLUS
INTRAVENOUS | Status: DC | PRN
  Administered 2019-07-19: 100 mg via INTRAVENOUS

## 2019-07-19 MED ORDER — LIDOCAINE-EPINEPHRINE 1 %-1:100000 IJ SOLN
INTRAMUSCULAR | Status: AC
  Filled 2019-07-19: qty 1

## 2019-07-19 MED ORDER — FENTANYL CITRATE (PF) 250 MCG/5ML IJ SOLN
INTRAMUSCULAR | Status: AC
  Filled 2019-07-19: qty 5

## 2019-07-19 MED ORDER — HYDROCODONE-ACETAMINOPHEN 5-325 MG PO TABS: 1.0000 | ORAL_TABLET | Freq: Four times a day (QID) | ORAL | 0 refills | Status: DC | PRN

## 2019-07-19 MED ORDER — LIDOCAINE 2% (20 MG/ML) 5 ML SYRINGE
INTRAMUSCULAR | Status: DC | PRN
  Administered 2019-07-19: 40 mg via INTRAVENOUS

## 2019-07-19 MED ORDER — ONDANSETRON HCL 4 MG/2ML IJ SOLN
INTRAMUSCULAR | Status: DC | PRN
  Administered 2019-07-19: 4 mg via INTRAVENOUS

## 2019-07-19 SURGICAL SUPPLY — 35 items
ARMBAND PINK RESTRICT EXTREMIT (MISCELLANEOUS) ×3 IMPLANT
BANDAGE ESMARK 6X9 LF (GAUZE/BANDAGES/DRESSINGS) ×1 IMPLANT
BNDG ESMARK 6X9 LF (GAUZE/BANDAGES/DRESSINGS) ×3
CANISTER SUCT 3000ML PPV (MISCELLANEOUS) ×3 IMPLANT
CLIP VESOCCLUDE MED 6/CT (CLIP) ×3 IMPLANT
CLIP VESOCCLUDE SM WIDE 6/CT (CLIP) ×3 IMPLANT
COVER PROBE W GEL 5X96 (DRAPES) ×3 IMPLANT
COVER WAND RF STERILE (DRAPES) ×3 IMPLANT
CUFF TOURN SGL QUICK 18X4 (TOURNIQUET CUFF) ×3 IMPLANT
DERMABOND ADVANCED (GAUZE/BANDAGES/DRESSINGS) ×2
DERMABOND ADVANCED .7 DNX12 (GAUZE/BANDAGES/DRESSINGS) ×1 IMPLANT
ELECT REM PT RETURN 9FT ADLT (ELECTROSURGICAL) ×3
ELECTRODE REM PT RTRN 9FT ADLT (ELECTROSURGICAL) ×1 IMPLANT
GLOVE BIO SURGEON STRL SZ7.5 (GLOVE) ×3 IMPLANT
GLOVE BIOGEL PI IND STRL 6.5 (GLOVE) ×1 IMPLANT
GLOVE BIOGEL PI INDICATOR 6.5 (GLOVE) ×2
GLOVE ECLIPSE 6.5 STRL STRAW (GLOVE) ×3 IMPLANT
GOWN STRL REUS W/ TWL LRG LVL3 (GOWN DISPOSABLE) ×2 IMPLANT
GOWN STRL REUS W/ TWL XL LVL3 (GOWN DISPOSABLE) ×1 IMPLANT
GOWN STRL REUS W/TWL LRG LVL3 (GOWN DISPOSABLE) ×4
GOWN STRL REUS W/TWL XL LVL3 (GOWN DISPOSABLE) ×2
KIT BASIN OR (CUSTOM PROCEDURE TRAY) ×3 IMPLANT
KIT TURNOVER KIT B (KITS) ×3 IMPLANT
NS IRRIG 1000ML POUR BTL (IV SOLUTION) ×3 IMPLANT
PACK CV ACCESS (CUSTOM PROCEDURE TRAY) ×3 IMPLANT
PAD ARMBOARD 7.5X6 YLW CONV (MISCELLANEOUS) ×6 IMPLANT
SUT MNCRL AB 4-0 PS2 18 (SUTURE) ×3 IMPLANT
SUT PROLENE 4 0 SH DA (SUTURE) ×3 IMPLANT
SUT PROLENE 5 0 C 1 24 (SUTURE) ×6 IMPLANT
SUT PROLENE 6 0 BV (SUTURE) IMPLANT
SUT VIC AB 3-0 SH 27 (SUTURE) ×2
SUT VIC AB 3-0 SH 27X BRD (SUTURE) ×1 IMPLANT
TOWEL GREEN STERILE (TOWEL DISPOSABLE) ×3 IMPLANT
UNDERPAD 30X30 (UNDERPADS AND DIAPERS) ×3 IMPLANT
WATER STERILE IRR 1000ML POUR (IV SOLUTION) ×3 IMPLANT

## 2019-07-19 NOTE — Transfer of Care (Signed)
Immediate Anesthesia Transfer of Care Note  Patient: [name] [name]  Procedure(s) Performed: PLICATION OF LEFT ARM ARTERIOVENOUS FISTULA (Left Arm Lower)  Patient Location: PACU  Anesthesia Type:General  Level of Consciousness: awake and alert   Airway & Oxygen Therapy: Patient Spontanous Breathing and Patient connected to nasal cannula oxygen  Post-op Assessment: Report given to RN and Post -op Vital signs reviewed and stable  Post vital signs: Reviewed and stable  Last Vitals:  Vitals Value Taken Time  BP 135/82 07/19/19 0817  Temp    Pulse 66 07/19/19 0818  Resp 14 07/19/19 0818  SpO2 97 % 07/19/19 0818  Vitals shown include unvalidated device data.  Last Pain:  Vitals:   07/19/19 0558  TempSrc: Oral  PainSc: 0-No pain         Complications: No apparent anesthesia complications

## 2019-07-19 NOTE — Anesthesia Postprocedure Evaluation (Signed)
Anesthesia Post Note  Patient: [name] [name]  Procedure(s) Performed: PLICATION OF LEFT ARM ARTERIOVENOUS FISTULA (Left Arm Lower)     Patient location during evaluation: PACU Anesthesia Type: General Level of consciousness: awake and alert Pain management: pain level controlled Vital Signs Assessment: post-procedure vital signs reviewed and stable Respiratory status: spontaneous breathing, nonlabored ventilation, respiratory function stable and patient connected to nasal cannula oxygen Cardiovascular status: blood pressure returned to baseline and stable Postop Assessment: no apparent nausea or vomiting Anesthetic complications: no    Last Vitals:  Vitals:   07/19/19 0832 07/19/19 0847  BP: 128/83 121/73  Pulse: 70 (!) 57  Resp: 19 14  Temp:  36.6 C  SpO2: 98% 96%    Last Pain:  Vitals:   07/19/19 0847  TempSrc:   PainSc: 0-No pain                 Kalis Friese L Daesia Zylka

## 2019-07-19 NOTE — Anesthesia Procedure Notes (Signed)
Procedure Name: LMA Insertion Date/Time: 07/19/2019 7:36 AM Performed by: Leonor Liv, CRNA Pre-anesthesia Checklist: Patient identified, Emergency Drugs available, Suction available and Patient being monitored Patient Re-evaluated:Patient Re-evaluated prior to induction Oxygen Delivery Method: Circle System Utilized Preoxygenation: Pre-oxygenation with 100% oxygen Induction Type: IV induction Ventilation: Mask ventilation without difficulty LMA: LMA inserted LMA Size: 5.0 Number of attempts: 1 Airway Equipment and Method: Bite block Placement Confirmation: positive ETCO2 Tube secured with: Tape Dental Injury: Teeth and Oropharynx as per pre-operative assessment

## 2019-07-19 NOTE — H&P (Signed)
   History and Physical Update  The patient was interviewed and re-examined.  The patient's previous History and Physical has been reviewed and is unchanged from recent office visit. Strong thrill in avf. Plan for plication of one area today in OR.   Coye Dawood C. Donzetta Matters, MD [facility] [place] Office: [phone] Pager: [phone]   07/19/2019, 7:10 AM

## 2019-07-19 NOTE — Discharge Instructions (Signed)
° °  [facility] [place] ° °Discharge Instructions ° °AV Fistula or Graft Surgery for Dialysis Access ° °Please refer to the following instructions for your post-procedure care. Your surgeon or physician assistant will discuss any changes with you. ° °Activity ° °You may drive the day following your surgery, if you are comfortable and no longer taking prescription pain medication. Resume full activity as the soreness in your incision resolves. ° °Bathing/Showering ° °You may shower after you go home. Keep your incision dry for 48 hours. Do not soak in a bathtub, hot tub, or swim until the incision heals completely. You may not shower if you have a hemodialysis catheter. ° °Incision Care ° °Clean your incision with mild soap and water after 48 hours. Pat the area dry with a clean towel. You do not need a bandage unless otherwise instructed. Do not apply any ointments or creams to your incision. You may have skin glue on your incision. Do not peel it off. It will come off on its own in about one week. Your arm may swell a bit after surgery. To reduce swelling use pillows to elevate your arm so it is above your heart. Your doctor will tell you if you need to lightly wrap your arm with an ACE bandage. ° °Diet ° °Resume your normal diet. There are not special food restrictions following this procedure. In order to heal from your surgery, it is CRITICAL to get adequate nutrition. Your body requires vitamins, minerals, and protein. Vegetables are the best source of vitamins and minerals. Vegetables also provide the perfect balance of protein. Processed food has little nutritional value, so try to avoid this. ° °Medications ° °Resume taking all of your medications. If your incision is causing pain, you may take over-the counter pain relievers such as acetaminophen (Tylenol). If you were prescribed a stronger pain medication, please be aware these medications can cause nausea and constipation. Prevent  nausea by taking the medication with a snack or meal. Avoid constipation by drinking plenty of fluids and eating foods with high amount of fiber, such as fruits, vegetables, and grains. Do not take Tylenol if you are taking prescription pain medications. ° ° ° ° °Follow up °Your surgeon may want to see you in the office following your access surgery. If so, this will be arranged at the time of your surgery. ° °Please call us immediately for any of the following conditions: ° °Increased pain, redness, drainage (pus) from your incision site °Fever of 101 degrees or higher °Severe or worsening pain at your incision site °Hand pain or numbness. ° °Reduce your risk of vascular disease: ° °Stop smoking. If you would like help, call [facility] at 1-800-QUIT-NOW (1-800-784-8669) or [place] at 336-586-4000 ° °Manage your cholesterol °Maintain a desired weight °Control your diabetes °Keep your blood pressure down ° °Dialysis ° °It will take several weeks to several months for your new dialysis access to be ready for use. Your surgeon will determine when it is OK to use it. Your nephrologist will continue to direct your dialysis. You can continue to use your Permcath until your new access is ready for use. ° °If you have any questions, please call the office at [phone]. ° °

## 2019-07-19 NOTE — Op Note (Signed)
    Patient name: [name] [name] MRN: [id number] DOB: 1961-01-10 Sex: male  07/19/2019 Pre-operative Diagnosis: End-stage renal disease, pseudoaneurysmal degeneration left arm AV fistula Post-operative diagnosis:  Same Surgeon:  Eda Paschal. Donzetta Matters, MD Assistant:  Arlee Muslim, PA Procedure Performed:   Revision left arm AV fistula with plication of pseudoaneurysm  Indications: 58 year old male on dialysis via left arm AV fistula.  He has previously had an area of pseudoaneurysmal degeneration ligated.  He now has a second area.  He is indicated for revision.   Procedure:  The patient was identified in the holding area and taken to the operating room where LMA anesthesia was induced.  He was sterilely prepped draped left upper extremity usual fashion antibiotics were minister and timeout was called.  3000 units of heparin were administered.  We marked the pseudoaneurysm.  Esmarch was used to exsanguinate the left lower extremity tourniquet was inflated 250 mmHg.  Elliptical incision was made around the pseudoaneurysmal area.  Fistula was mobilized from the skin.  We plicated the fistula with 4-0 Prolene suture in running mattress fashion.  Prior to completion of the suture line we irrigated with heparinized saline.  Tourniquet was allowed down.  We completed our suture line.  There was no further bleeding there was a strong thrill.  Skin was closed with 4 Monocryl and Dermabond placed to level skin.  He was awakened anesthesia having tolerated procedure without immediate complication.  All counts were correct at completion.  EBL: 10 cc   Kaneshia Cater C. Donzetta Matters, MD [facility] [employer] Office: [phone] Pager: [phone]

## 2019-07-19 NOTE — Anesthesia Preprocedure Evaluation (Addendum)
Anesthesia Evaluation  Patient identified by MRN, date of birth, ID band Patient awake    Reviewed: Allergy & Precautions, NPO status , Patient's Chart, lab work & pertinent test results  Airway Mallampati: I  TM Distance: >3 FB Neck ROM: Full    Dental no notable dental hx. (+) Teeth Intact, Dental Advisory Given   Pulmonary neg pulmonary ROS, former smoker,    Pulmonary exam normal breath sounds clear to auscultation       Cardiovascular hypertension, + DVT  Normal cardiovascular exam Rhythm:Regular Rate:Normal     Neuro/Psych negative neurological ROS  negative psych ROS   GI/Hepatic Neg liver ROS, GERD  Medicated,  Endo/Other  negative endocrine ROS  Renal/GU ESRF and DialysisRenal disease (K 4.2)  negative genitourinary   Musculoskeletal negative musculoskeletal ROS (+)   Abdominal   Peds  Hematology negative hematology ROS (+)   Anesthesia Other Findings   Reproductive/Obstetrics                            Anesthesia Physical Anesthesia Plan  ASA: III  Anesthesia Plan: General   Post-op Pain Management:    Induction: Intravenous  PONV Risk Score and Plan: 1 and Propofol infusion and Treatment may vary due to age or medical condition  Airway Management Planned: LMA  Additional Equipment:   Intra-op Plan:   Post-operative Plan:   Informed Consent: I have reviewed the patients History and Physical, chart, labs and discussed the procedure including the risks, benefits and alternatives for the proposed anesthesia with the patient or authorized representative who has indicated his/her understanding and acceptance.     Dental advisory given  Plan Discussed with: CRNA  Anesthesia Plan Comments:        Anesthesia Quick Evaluation

## 2019-07-20 ENCOUNTER — Encounter (HOSPITAL_COMMUNITY): Payer: Self-pay | Admitting: Vascular Surgery

## 2019-08-18 ENCOUNTER — Other Ambulatory Visit: Payer: Self-pay

## 2019-08-18 ENCOUNTER — Ambulatory Visit (INDEPENDENT_AMBULATORY_CARE_PROVIDER_SITE_OTHER): Payer: Self-pay | Admitting: Physician Assistant

## 2019-08-18 VITALS — BP 118/83 | HR 95 | Temp 97.8°F | Resp 14 | Ht 69.0 in | Wt 199.0 lb

## 2019-08-18 DIAGNOSIS — N186 End stage renal disease: Secondary | ICD-10-CM

## 2019-08-18 DIAGNOSIS — Z992 Dependence on renal dialysis: Secondary | ICD-10-CM

## 2019-08-18 NOTE — Progress Notes (Signed)
  POST OPERATIVE OFFICE NOTE    CC:  F/u for surgery  HPI:  This is a 58 y.o. 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  He denise pain, loss of motor and loss of sensation.  He denise fever and chills.   He is here today for incisional check and fistula check.    Allergies  Allergen Reactions  . Fentanyl Nausea Only  . Vancomycin Other (See Comments)    Red face ? Red Man Syndrome ?    Current Outpatient Medications  Medication Sig Dispense Refill  . acetaminophen (TYLENOL) 500 MG tablet Take 500-1,000 mg by mouth every 6 (six) hours as needed for moderate pain.    Marland Kitchen aspirin EC 81 MG tablet Take 81 mg by mouth every evening.    . bismuth subsalicylate (PEPTO BISMOL) 262 MG/15ML suspension Take 30 mLs by mouth every 6 (six) hours as needed for indigestion.    . cetirizine (ZYRTEC) 10 MG tablet Take 10 mg by mouth daily as needed for allergies.    . cinacalcet (SENSIPAR) 30 MG tablet Take 30 mg by mouth every evening.     . ferric citrate (AURYXIA) 1 GM 210 MG(Fe) tablet Take 210-630 mg by mouth 3 (three) times daily with meals. Take 630 mg with each meal and 210 - 420 mg with each snack    . fluticasone (FLONASE) 50 MCG/ACT nasal spray Place 2 sprays into both nostrils daily as needed for allergies.    Marland Kitchen HYDROcodone-acetaminophen (NORCO) 5-325 MG tablet Take 1 tablet by mouth every 6 (six) hours as needed for moderate pain. 12 tablet 0  . hydrocortisone cream 1 % Apply 1 application topically daily as needed for itching.    . multivitamin (RENA-VIT) TABS tablet Take 1 tablet by mouth every morning. (Patient taking differently: Take 1 tablet by mouth every evening. ) 30 tablet PRN  . pantoprazole (PROTONIX) 40 MG tablet Take 40 mg by mouth every evening.      No current facility-administered medications for this visit.      ROS:  See HPI  Physical Exam:    Incision:  Erythema with stick marks over new incisional area distal incision.  No  ulcer or openings in the skin. Extremities:  Palpable thrill throughout the fistula, grip 5/5 and sensation intact.   Assessment/Plan:  This is a 58 y.o. male who is s/p:plication distal aneurysmal fistula.     He has been letting them stick at the new incision site on the fistula.  Plication performed 0/07/9322.  There is erythema over the incision and surrounding skin.  I have advised him to rest the area for 2-3 weeks until the erythema goes away.  He may need a course of antibiotics after HD.  If the erythema does not subside with rest.  He denise fever and chills.    He will f/u PRN.    Roxy Horseman PA-C [facility] [phone]  Clinic MD:  Donzetta Matters

## 2019-08-26 ENCOUNTER — Encounter (HOSPITAL_COMMUNITY): Payer: Self-pay | Admitting: Emergency Medicine

## 2019-08-26 ENCOUNTER — Emergency Department (HOSPITAL_COMMUNITY)
Admission: EM | Admit: 2019-08-26 | Discharge: 2019-08-27 | Disposition: A | Discharge status: Home / Self Care | Payer: Medicare Other | Attending: Emergency Medicine | Admitting: Emergency Medicine

## 2019-08-26 ENCOUNTER — Other Ambulatory Visit: Payer: Self-pay

## 2019-08-26 DIAGNOSIS — X58XXXA Exposure to other specified factors, initial encounter: Secondary | ICD-10-CM | POA: Diagnosis not present

## 2019-08-26 DIAGNOSIS — Z87891 Personal history of nicotine dependence: Secondary | ICD-10-CM | POA: Diagnosis not present

## 2019-08-26 DIAGNOSIS — Y939 Activity, unspecified: Secondary | ICD-10-CM | POA: Insufficient documentation

## 2019-08-26 DIAGNOSIS — Z992 Dependence on renal dialysis: Secondary | ICD-10-CM | POA: Insufficient documentation

## 2019-08-26 DIAGNOSIS — N186 End stage renal disease: Secondary | ICD-10-CM | POA: Insufficient documentation

## 2019-08-26 DIAGNOSIS — Z79899 Other long term (current) drug therapy: Secondary | ICD-10-CM | POA: Insufficient documentation

## 2019-08-26 DIAGNOSIS — Z7982 Long term (current) use of aspirin: Secondary | ICD-10-CM | POA: Diagnosis not present

## 2019-08-26 DIAGNOSIS — Y999 Unspecified external cause status: Secondary | ICD-10-CM | POA: Diagnosis not present

## 2019-08-26 DIAGNOSIS — Y929 Unspecified place or not applicable: Secondary | ICD-10-CM | POA: Insufficient documentation

## 2019-08-26 DIAGNOSIS — I120 Hypertensive chronic kidney disease with stage 5 chronic kidney disease or end stage renal disease: Secondary | ICD-10-CM | POA: Diagnosis not present

## 2019-08-26 DIAGNOSIS — T148XXA Other injury of unspecified body region, initial encounter: Secondary | ICD-10-CM

## 2019-08-26 DIAGNOSIS — S5011XA Contusion of right forearm, initial encounter: Secondary | ICD-10-CM | POA: Insufficient documentation

## 2019-08-26 NOTE — ED Triage Notes (Signed)
Pt here for eval of redness and swelling to left fistula site. states it has looked this way for about a week. Last dialysis was today. Afebrile, HR 85.

## 2019-08-26 NOTE — ED Provider Notes (Addendum)
[facility] EMERGENCY DEPARTMENT Provider Note  CSN: [id number] Arrival date & time: 08/26/19 2213  Chief Complaint(s) No chief complaint on file.  HPI [name] [name] is a 58 y.o. male h/o ESRD on HD TTS s/p AVF plication on 0/25 here for persistent erythema of the AVF site for 1.5 weeks. Seen at [place] [facility] on 10/30 for this. Recommended site rest and monitoring. Possible abx if no improvement. He denies site pain, fever, chills. No other complaints  HPI  Past Medical History Past Medical History:  Diagnosis Date  . Anemia   . Anxiety    situational  . DVT (deep venous thrombosis) (HCC)    leg  . ESRD (end stage renal disease) ([place])    TTHSAT [place]  . GERD (gastroesophageal reflux disease)   . Hypertension   . Leucocytosis   . Polycystic kidney disease   . Shortness of breath    Patient Active Problem List   Diagnosis Date Noted  . History of DVT (deep vein thrombosis) 05/16/2019  . CAP (community acquired pneumonia) 09/15/2018  . Sepsis ([place]) 09/14/2018  . End stage renal disease ([place]) 05/23/2014  . Metabolic acidosis 42/70/6237  . Essential hypertension, benign 04/04/2014  . Acute encephalopathy 04/04/2014  . Polycystic kidney disease 04/02/2014  . Acute renal failure ([place]) 04/02/2014   Home Medication(s) Prior to Admission medications   Medication Sig Start Date End Date Taking? Authorizing Provider  acetaminophen (TYLENOL) 500 MG tablet Take 500-1,000 mg by mouth every 6 (six) hours as needed for moderate pain.    [provider]  aspirin EC 81 MG tablet Take 81 mg by mouth every evening.    [provider]  bismuth subsalicylate (PEPTO BISMOL) 262 MG/15ML suspension Take 30 mLs by mouth every 6 (six) hours as needed for indigestion.    [provider]  cetirizine (ZYRTEC) 10 MG tablet Take 10 mg by mouth daily as needed for allergies.    [provider]  cinacalcet (SENSIPAR) 30 MG tablet Take 30 mg by mouth  every evening.     [provider]  ferric citrate (AURYXIA) 1 GM 210 MG(Fe) tablet Take 210-630 mg by mouth 3 (three) times daily with meals. Take 630 mg with each meal and 210 - 420 mg with each snack    [provider]  fluticasone (FLONASE) 50 MCG/ACT nasal spray Place 2 sprays into both nostrils daily as needed for allergies. 12/15/17   [provider]  hydrocortisone cream 1 % Apply 1 application topically daily as needed for itching.    [provider]  multivitamin (RENA-VIT) TABS tablet Take 1 tablet by mouth every morning. Patient taking differently: Take 1 tablet by mouth every evening.  04/10/14   Ramiro Harvest, PA-C  pantoprazole (PROTONIX) 40 MG tablet Take 40 mg by mouth every evening.     [provider]  Past Surgical History Past Surgical History:  Procedure Laterality Date  . AV FISTULA PLACEMENT Left 04/05/2014   Procedure: CREATION OF ARTERIOVENOUS (AV) FISTULA ;  Surgeon: Angelia Mould, MD;  Location: [place];  Service: Vascular;  Laterality: Left;  . CENTRAL VENOUS CATHETER INSERTION  04/02/2014   DR Titus Mould  . COLONOSCOPY    . FISTULA SUPERFICIALIZATION Left 03/27/6788   Procedure: PLICATION OF ANEURYSM ARTERIOVENOUS FISTULA LEFT ARM;  Surgeon: Waynetta Sandy, MD;  Location: [place];  Service: Vascular;  Laterality: Left;  . INSERTION OF DIALYSIS CATHETER Right 04/05/2014   Procedure: ULTRASOUND GUIDED INSERTION OF DIALYSIS CATHETER;  Surgeon: Angelia Mould, MD;  Location: [place];  Service: Vascular;  Laterality: Right;  . NOSE SURGERY    . REVISON OF ARTERIOVENOUS FISTULA Left 3/81/0175   Procedure: PLICATION OF LEFT ARM ARTERIOVENOUS FISTULA;  Surgeon: Waynetta Sandy, MD;  Location: [facility] OR;  Service: Vascular;  Laterality: Left;   Family History Family History   Problem Relation Age of Onset  . Polycystic kidney disease Father   . Hypertension Father   . Polycystic kidney disease Sister     Social History Social History   Tobacco Use  . Smoking status: Former Research scientist (life sciences)  . Smokeless tobacco: Never Used  . Tobacco comment: QUIT SMOKING CIGARETTES  IN 1994      QUIT SMOKING POT   Substance Use Topics  . Alcohol use: No  . Drug use: No   Allergies Fentanyl and Vancomycin  Review of Systems Review of Systems All other systems are reviewed and are negative for acute change except as noted in the HPI  Physical Exam Vital Signs  I have reviewed the triage vital signs BP 128/82 (BP Location: Right Arm)   Pulse 85   Temp 97.7 F (36.5 C) (Oral)   Resp 18   SpO2 95%   Physical Exam Vitals signs reviewed.  Constitutional:      General: He is not in acute distress.    Appearance: He is well-developed. He is not diaphoretic.  HENT:     Head: Normocephalic and atraumatic.     Jaw: No trismus.     Right Ear: External ear normal.     Left Ear: External ear normal.     Nose: Nose normal.  Eyes:     General: No scleral icterus.    Conjunctiva/sclera: Conjunctivae normal.  Neck:     Musculoskeletal: Normal range of motion.     Trachea: Phonation normal.  Cardiovascular:     Rate and Rhythm: Normal rate and regular rhythm.     Comments: Left forarm AVF. Mild erythema to distal aspect of AVF. Nontender. (see image) Pulmonary:     Effort: Pulmonary effort is normal. No respiratory distress.     Breath sounds: No stridor.  Abdominal:     General: There is no distension.  Musculoskeletal: Normal range of motion.  Neurological:     Mental Status: He is alert and oriented to person, place, and time.  Psychiatric:        Behavior: Behavior normal.       ED Results and Treatments Labs (all labs ordered are listed, but only abnormal results are displayed) Labs Reviewed  BASIC METABOLIC PANEL - Abnormal; Notable for the following  components:      Result Value   Chloride 96 (*)    BUN 34 (*)    Creatinine, Ser 9.60 (*)    GFR calc non Af Amer 5 (*)  GFR calc Af Amer 6 (*)    All other components within normal limits  CBC WITH DIFFERENTIAL/PLATELET                                                                                                                         EKG  EKG Interpretation  Date/Time:    Ventricular Rate:    PR Interval:    QRS Duration:   QT Interval:    QTC Calculation:   R Axis:     Text Interpretation:        Radiology No results found.  Pertinent labs & imaging results that were available during my care of the patient were reviewed by me and considered in my medical decision making (see chart for details).  Medications Ordered in ED Medications - No data to display                                                                                                                                  Procedures Ultrasound ED Soft Tissue  Date/Time: 08/27/2019 1:58 AM Performed by: Fatima Blank, MD Authorized by: Fatima Blank, MD   Procedure details:    Indications: localization of abscess and evaluate for cellulitis     Transverse view:  Visualized   Longitudinal view:  Visualized   Images: archived   Location:    Location: upper extremity     Side:  Left Findings:     no cellulitis present Comments:     Small hypoechoic area adjacent to the AVF noted. No communication from AVF to fluid collection.    (including critical care time)  Medical Decision Making / ED Course I have reviewed the nursing notes for this encounter and the patient's prior records (if available in EHR or on provided paperwork).   [name] [name] was evaluated in Emergency Department on 08/27/2019 for the symptoms described in the history of present illness. He was evaluated in the context of the global COVID-19 pandemic, which necessitated consideration that the patient might be at risk  for infection with the SARS-CoV-2 virus that causes COVID-19. Institutional protocols and algorithms that pertain to the evaluation of patients at risk for COVID-19 are in a state of rapid change based on information released by regulatory bodies including the CDC and federal and state organizations. These policies and algorithms were followed during the patient's care in the ED.  Patient presents  with redness around the AV fistula. Has been stable for over 1 week. Nontender. Ultrasound without cobblestoning and not concerning for cellulitis. There is a small area of hypoechoic fluid collection on ultrasound.  When aspirated, this was small hematoma.  No purulence extracted.  Labs without leukocytosis.  Not suspicious for cellulitis/abscess.  Recommended follow-up with vascular surgery as needed.  The patient appears reasonably screened and/or stabilized for discharge and I doubt any other medical condition or other [facility] requiring further screening, evaluation, or treatment in the ED at this time prior to discharge.  The patient is safe for discharge with strict return precautions.       Final Clinical Impression(s) / ED Diagnoses Final diagnoses:  Hematoma    The patient appears reasonably screened and/or stabilized for discharge and I doubt any other medical condition or other [facility] requiring further screening, evaluation, or treatment in the ED at this time prior to discharge.  Disposition: Discharge  Condition: Good  I have discussed the results, Dx and Tx plan with the patient who expressed understanding and agree(s) with the plan. Discharge instructions discussed at great length. The patient was given strict return precautions who verbalized understanding of the instructions. No further questions at time of discharge.    ED Discharge Orders    None        Follow Up: Waynetta Sandy, [address] [place] [address] [phone]  Schedule an appointment  as soon as possible for a visit        This chart was dictated using voice recognition software.  Despite best efforts to proofread,  errors can occur which can change the documentation meaning.     Fatima Blank, MD 08/27/19 [phone]

## 2019-08-27 DIAGNOSIS — S5011XA Contusion of right forearm, initial encounter: Secondary | ICD-10-CM | POA: Diagnosis not present

## 2019-08-27 LAB — CBC WITH DIFFERENTIAL/PLATELET
Abs Immature Granulocytes: 0.02 10*3/uL (ref 0.00–0.07)
Basophils Absolute: 0.1 10*3/uL (ref 0.0–0.1)
Basophils Relative: 1 %
Eosinophils Absolute: 0.5 10*3/uL (ref 0.0–0.5)
Eosinophils Relative: 7 %
HCT: 45.9 % (ref 39.0–52.0)
Hemoglobin: 15 g/dL (ref 13.0–17.0)
Immature Granulocytes: 0 %
Lymphocytes Relative: 21 %
Lymphs Abs: 1.6 10*3/uL (ref 0.7–4.0)
MCH: 31.3 pg (ref 26.0–34.0)
MCHC: 32.7 g/dL (ref 30.0–36.0)
MCV: 95.6 fL (ref 80.0–100.0)
Monocytes Absolute: 0.7 10*3/uL (ref 0.1–1.0)
Monocytes Relative: 9 %
Neutro Abs: 4.7 10*3/uL (ref 1.7–7.7)
Neutrophils Relative %: 62 %
Platelets: 164 10*3/uL (ref 150–400)
RBC: 4.8 MIL/uL (ref 4.22–5.81)
RDW: 13.1 % (ref 11.5–15.5)
WBC: 7.6 10*3/uL (ref 4.0–10.5)
nRBC: 0 % (ref 0.0–0.2)

## 2019-08-27 LAB — BASIC METABOLIC PANEL
Anion gap: 13 (ref 5–15)
BUN: 34 mg/dL — ABNORMAL HIGH (ref 6–20)
CO2: 30 mmol/L (ref 22–32)
Calcium: 9.1 mg/dL (ref 8.9–10.3)
Chloride: 96 mmol/L — ABNORMAL LOW (ref 98–111)
Creatinine, Ser: 9.6 mg/dL — ABNORMAL HIGH (ref 0.61–1.24)
GFR calc Af Amer: 6 mL/min — ABNORMAL LOW (ref >60)
GFR calc non Af Amer: 5 mL/min — ABNORMAL LOW (ref >60)
Glucose, Bld: 88 mg/dL (ref 70–99)
Potassium: 3.9 mmol/L (ref 3.5–5.1)
Sodium: 139 mmol/L (ref 135–145)

## 2019-08-27 NOTE — ED Notes (Signed)
Patient verbalizes understanding of discharge instructions. Opportunity for questioning and answers were provided. Armband removed by staff, pt discharged from ED.  

## 2019-09-04 ENCOUNTER — Ambulatory Visit (INDEPENDENT_AMBULATORY_CARE_PROVIDER_SITE_OTHER): Payer: Self-pay | Admitting: Family

## 2019-09-04 ENCOUNTER — Other Ambulatory Visit: Payer: Self-pay

## 2019-09-04 ENCOUNTER — Encounter: Payer: Self-pay | Admitting: Family

## 2019-09-04 VITALS — BP 110/78 | HR 76 | Temp 97.6°F | Resp 18 | Ht 69.0 in | Wt 200.0 lb

## 2019-09-04 DIAGNOSIS — N186 End stage renal disease: Secondary | ICD-10-CM

## 2019-09-04 DIAGNOSIS — I770 Arteriovenous fistula, acquired: Secondary | ICD-10-CM

## 2019-09-04 DIAGNOSIS — Z992 Dependence on renal dialysis: Secondary | ICD-10-CM

## 2019-09-04 NOTE — Progress Notes (Signed)
CC: ED visit follow up of left forearm AVF, ESRD on hemodialysis   History of Present Illness  [name] [name] is a 58 y.o. (05/22/1961) male who is s/p revision left arm AV fistula with plication of distal pseudoaneurysm on 07/19/19 by Dr. Donzetta Matters. He has previously had an area of pseudoaneurysmal degeneration more proximally that was ligated on 05-24-19 by Dr. Donzetta Matters.   He was last evaluated on 08-18-19 by [facility] [employer]. At that time pt had been letting the kidney center stick at the new incision site on the fistula.  Plication performed 5/80/9983.  There was erythema over the incision and surrounding skin.  [name] advised pt to rest the area for 2-3 weeks until the erythema goes away.  He may need a course of antibiotics after HD.  If the erythema does not subside with rest.  He denied fever and chills.   He was to f/u PRN.    He returns today at the request of the ED physician for visit on 08-26-19. Excerpt from Dr. Wilma Flavin note: "Patient presents with redness around the AV fistula. Has been stable for over 1 week. Nontender. Ultrasound without cobblestoning and not concerning for cellulitis. There is a small area of hypoechoic fluid collection on ultrasound.  When aspirated, this was small hematoma.  No purulence extracted. Labs without leukocytosis. Not suspicious for cellulitis/abscess.  Recommended follow-up with vascular surgery as needed."  Pt states that since the hematoma was drained at his AVF that the swelling had decreased, until he was access 2 days ago at the [facility] on the incision of the most recent plication, then had an enlargement of the more distal aneurysm on his AVF.   Pt states that he has not had any bleeding problems from the AVF. He denies fever or chills, denies dyspnea or chest pain.   Pt dialyzes on TTS via left forearm AVF  at [facility] on [address].   This is his first permanent access.    Past Medical History:  Diagnosis Date  . Anemia   .  Anxiety    situational  . DVT (deep venous thrombosis) (HCC)    leg  . ESRD (end stage renal disease) ([place])    TTHSAT [place]  . GERD (gastroesophageal reflux disease)   . Hypertension   . Leucocytosis   . Polycystic kidney disease   . Shortness of breath     Social History Social History   Tobacco Use  . Smoking status: Former Research scientist (life sciences)  . Smokeless tobacco: Never Used  . Tobacco comment: QUIT SMOKING CIGARETTES  IN 1994      QUIT SMOKING POT   Substance Use Topics  . Alcohol use: No  . Drug use: No    Family History Family History  Problem Relation Age of Onset  . Polycystic kidney disease Father   . Hypertension Father   . Polycystic kidney disease Sister     Surgical History Past Surgical History:  Procedure Laterality Date  . AV FISTULA PLACEMENT Left 04/05/2014   Procedure: CREATION OF ARTERIOVENOUS (AV) FISTULA ;  Surgeon: Angelia Mould, MD;  Location: [place];  Service: Vascular;  Laterality: Left;  . CENTRAL VENOUS CATHETER INSERTION  04/02/2014   DR Titus Mould  . COLONOSCOPY    . FISTULA SUPERFICIALIZATION Left 12/25/2503   Procedure: PLICATION OF ANEURYSM ARTERIOVENOUS FISTULA LEFT ARM;  Surgeon: Waynetta Sandy, MD;  Location: [place];  Service: Vascular;  Laterality: Left;  . INSERTION OF DIALYSIS CATHETER  Right 04/05/2014   Procedure: ULTRASOUND GUIDED INSERTION OF DIALYSIS CATHETER;  Surgeon: Angelia Mould, MD;  Location: [place];  Service: Vascular;  Laterality: Right;  . NOSE SURGERY    . REVISON OF ARTERIOVENOUS FISTULA Left 0/62/6948   Procedure: PLICATION OF LEFT ARM ARTERIOVENOUS FISTULA;  Surgeon: Waynetta Sandy, MD;  Location: [place];  Service: Vascular;  Laterality: Left;    Allergies  Allergen Reactions  . Fentanyl Nausea Only  . Vancomycin Other (See Comments)    Red face ? Red Man Syndrome ?    Current Outpatient Medications  Medication Sig Dispense Refill  . acetaminophen (TYLENOL) 500 MG tablet Take  500-1,000 mg by mouth every 6 (six) hours as needed for moderate pain.    Marland Kitchen aspirin EC 81 MG tablet Take 81 mg by mouth every evening.    . bismuth subsalicylate (PEPTO BISMOL) 262 MG/15ML suspension Take 30 mLs by mouth every 6 (six) hours as needed for indigestion.    . cetirizine (ZYRTEC) 10 MG tablet Take 10 mg by mouth daily as needed for allergies.    . cinacalcet (SENSIPAR) 30 MG tablet Take 30 mg by mouth every evening.     . ferric citrate (AURYXIA) 1 GM 210 MG(Fe) tablet Take 210-630 mg by mouth 3 (three) times daily with meals. Take 630 mg with each meal and 210 - 420 mg with each snack    . fluticasone (FLONASE) 50 MCG/ACT nasal spray Place 2 sprays into both nostrils daily as needed for allergies.    . hydrocortisone cream 1 % Apply 1 application topically daily as needed for itching.    . multivitamin (RENA-VIT) TABS tablet Take 1 tablet by mouth every morning. (Patient taking differently: Take 1 tablet by mouth every evening. ) 30 tablet PRN  . pantoprazole (PROTONIX) 40 MG tablet Take 40 mg by mouth every evening.      No current facility-administered medications for this visit.      REVIEW OF SYSTEMS: see HPI for pertinent positives and negatives    PHYSICAL EXAMINATION:  Vitals:   09/04/19 1100  BP: 110/78  Pulse: 76  Resp: 18  Temp: 97.6 F (36.4 C)  TempSrc: Temporal  SpO2: 96%  Weight: 200 lb (90.7 kg)  Height: 5\' 9"  (1.753 m)   Body mass index is 29.53 kg/m.  General: Male in NAD HEENT:  No gross abnormalities Pulmonary: Respirations are non-labored Abdomen: Soft and non-tender  Musculoskeletal: There are no major deformities.   Neurologic: No focal weakness or paresthesias are detected Skin: There are no ulcer or rashes noted. 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 See photo below Psychiatric: The patient has normal affect. Cardiovascular: There is a regular rate and rhythm Bilateral radial pulses are 2+ palpable.  Left forearm AVF with brisk thrill and bruit.    Left forearm AVF   Medical Decision Making  [name] [name] is a 58 y.o. male who presents with slight enlargement and possible friability of the more distal aneurysmal portion of the AVF, cannulated 2 days ago st the incision of the most recent plication.   Dr. Trula Slade spoke with and evaluated pt.  Follow up in 2 weeks for re evaluation of AVF scab.   DIALYSIS CENTER TO NOT CANNULATE AT OR CLOSE TO THE MORE DISTAL ANEURYSM OF THE AVF.   I advised his to apply direct pressure if he has any bleeding issues from his AVF and to call 911.   Vinnie Level Nickel,  RN, MSN, FNP-C [facility] [place] Office: [phone]  09/04/2019, 11:07 AM  Clinic MD: Trula Slade

## 2019-09-18 ENCOUNTER — Other Ambulatory Visit: Payer: Self-pay

## 2019-09-18 ENCOUNTER — Ambulatory Visit (INDEPENDENT_AMBULATORY_CARE_PROVIDER_SITE_OTHER): Payer: Self-pay | Admitting: Family

## 2019-09-18 ENCOUNTER — Encounter: Payer: Self-pay | Admitting: Family

## 2019-09-18 VITALS — BP 140/86 | HR 59 | Temp 97.4°F | Resp 18 | Ht 69.0 in | Wt 202.0 lb

## 2019-09-18 DIAGNOSIS — Z992 Dependence on renal dialysis: Secondary | ICD-10-CM

## 2019-09-18 DIAGNOSIS — I770 Arteriovenous fistula, acquired: Secondary | ICD-10-CM

## 2019-09-18 DIAGNOSIS — N186 End stage renal disease: Secondary | ICD-10-CM

## 2019-09-18 NOTE — Progress Notes (Signed)
CC: Follow up AVF aneurysm plication  History of Present Illness  [name] [name] is a 58 y.o. 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 Donzetta Matters. He has previously had an area of pseudoaneurysmal degeneration more proximally that was ligated on 05-24-19 by Dr. Donzetta Matters.   He was evaluated on 08-18-19 by [facility] [employer]. At that time pt had been letting the kidney center stick at the new incision site on the fistula. Plication performed 3/41/9379. There was erythema over the incision and surrounding skin. [name] advised pt to rest the area for 2-3 weeks until the erythema goes away. He may need a course of antibiotics after HD. If the erythema does not subside with rest. He denied fever and chills.  He was to f/u PRN.   He returned on 09-04-19 at the request of the ED physician for visit on 08-26-19. Excerpt from Dr. Wilma Flavin note: "Patient presents with redness around the AV fistula. Has been stable for over 1 week. Nontender. Ultrasound without cobblestoning and not concerning for cellulitis. There is a small area of hypoechoic fluid collection on ultrasound.When aspirated, this was small hematoma. No purulence extracted. Labs without leukocytosis. Not suspicious for cellulitis/abscess. Recommended follow-up with vascular surgery as needed."  Pt states that since the hematoma was drained at his AVF that the swelling had decreased, until he was accessed on 09-02-19 at the [facility] on the incision of the most recent plication, then had an enlargement of the more distal aneurysm on his AVF.   Pt states that he has not had any bleeding problems from the AVF. He denies fever or chills, denies dyspnea or chest pain.  He states he has some steal symptoms in his left hand during dialysis, but rare steal symptoms when not being dialyzed.   Pt dialyzes on TTS via left forearm AVF  at [facility] on [place].    This is his first permanent access.    Past Medical History:  Diagnosis Date  . Anemia   . Anxiety    situational  . DVT (deep venous thrombosis) (HCC)    leg  . ESRD (end stage renal disease) ([place])    [facility]SAT [place]  . GERD (gastroesophageal reflux disease)   . Hypertension   . Leucocytosis   . Polycystic kidney disease   . Shortness of breath     Social History Social History   Tobacco Use  . Smoking status: Former Research scientist (life sciences)  . Smokeless tobacco: Never Used  . Tobacco comment: QUIT SMOKING CIGARETTES  IN 1994      QUIT SMOKING POT   Substance Use Topics  . Alcohol use: No  . Drug use: No    Family History Family History  Problem Relation Age of Onset  . Polycystic kidney disease Father   . Hypertension Father   . Polycystic kidney disease Sister     Surgical History Past Surgical History:  Procedure Laterality Date  . AV FISTULA PLACEMENT Left 04/05/2014   Procedure: CREATION OF ARTERIOVENOUS (AV) FISTULA ;  Surgeon: Angelia Mould, MD;  Location: [place];  Service: Vascular;  Laterality: Left;  . CENTRAL VENOUS CATHETER INSERTION  04/02/2014   DR Titus Mould  . COLONOSCOPY    . FISTULA SUPERFICIALIZATION Left 0/11/4095   Procedure: PLICATION OF ANEURYSM ARTERIOVENOUS FISTULA LEFT ARM;  Surgeon: Waynetta Sandy, MD;  Location: [place];  Service: Vascular;  Laterality: Left;  . INSERTION OF  DIALYSIS CATHETER Right 04/05/2014   Procedure: ULTRASOUND GUIDED INSERTION OF DIALYSIS CATHETER;  Surgeon: Angelia Mould, MD;  Location: [place];  Service: Vascular;  Laterality: Right;  . NOSE SURGERY    . REVISON OF ARTERIOVENOUS FISTULA Left 2/42/6834   Procedure: PLICATION OF LEFT ARM ARTERIOVENOUS FISTULA;  Surgeon: Waynetta Sandy, MD;  Location: [place];  Service: Vascular;  Laterality: Left;    Allergies  Allergen Reactions  . Fentanyl Nausea Only  . Vancomycin Other (See Comments)    Red face ? Red Man Syndrome ?    Current  Outpatient Medications  Medication Sig Dispense Refill  . acetaminophen (TYLENOL) 500 MG tablet Take 500-1,000 mg by mouth every 6 (six) hours as needed for moderate pain.    Marland Kitchen aspirin EC 81 MG tablet Take 81 mg by mouth every evening.    . bismuth subsalicylate (PEPTO BISMOL) 262 MG/15ML suspension Take 30 mLs by mouth every 6 (six) hours as needed for indigestion.    . cetirizine (ZYRTEC) 10 MG tablet Take 10 mg by mouth daily as needed for allergies.    . cinacalcet (SENSIPAR) 30 MG tablet Take 30 mg by mouth every evening.     . ferric citrate (AURYXIA) 1 GM 210 MG(Fe) tablet Take 210-630 mg by mouth 3 (three) times daily with meals. Take 630 mg with each meal and 210 - 420 mg with each snack    . fluticasone (FLONASE) 50 MCG/ACT nasal spray Place 2 sprays into both nostrils daily as needed for allergies.    . hydrocortisone cream 1 % Apply 1 application topically daily as needed for itching.    . multivitamin (RENA-VIT) TABS tablet Take 1 tablet by mouth every morning. (Patient taking differently: Take 1 tablet by mouth every evening. ) 30 tablet PRN  . pantoprazole (PROTONIX) 40 MG tablet Take 40 mg by mouth every evening.      No current facility-administered medications for this visit.      REVIEW OF SYSTEMS: see HPI for pertinent positives and negatives    PHYSICAL EXAMINATION:  Vitals:   09/18/19 1117  BP: 140/86  Pulse: (!) 59  Resp: 18  Temp: (!) 97.4 F (36.3 C)  TempSrc: Temporal  SpO2: 95%  Weight: 202 lb (91.6 kg)  Height: 5\' 9"  (1.753 m)   Body mass index is 29.83 kg/m.  General: Male in NAD HEENT:  No gross abnormalities Pulmonary: Respirations are non-labored Abdomen: Soft and non-tender Musculoskeletal: There are no major deformities.   Neurologic: No focal weakness or paresthesias are detected Skin: There are no ulcer or rashes noted. See Cardiovascular  Psychiatric: The patient has normal affect. Cardiovascular: There is a regular rate and rhythm  without significant murmur appreciated.  Bilateral radial pulses are 2+ palpable. Left forearm AVF with brisk thrill and bruit.  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 See photo below    Left forearm AVF     Medical Decision Making  Raider Krabill is a 57 y.o. 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 Donzetta Matters. He has previously had an area of pseudoaneurysmal degeneration more proximally that was ligated on 05-24-19 by Dr. Donzetta Matters. He returns for 2 weeks follow up.  Pt has not had any bleeding issues from the AVF, no signs or symptoms of infection.    Dr. Trula Slade spoke with and evaluated pt, he agrees that there is no thinning of skin  over the aneurysms of the AVF left forearm  Follow up  As needed.     Clemon Chambers, RN, MSN, FNP-C [facility] Office: [phone]  09/18/2019, 11:21 AM  Clinic MD: Trula Slade

## 2019-11-24 ENCOUNTER — Telehealth (HOSPITAL_COMMUNITY): Payer: Self-pay

## 2019-11-24 NOTE — Telephone Encounter (Signed)
The above patient or their representative was contacted and gave the following answers to these questions:         Do you have any of the following symptoms?    NO  Fever                    Cough                   Shortness of breath  Do  you have any of the following other symptoms?    muscle pain         vomiting,        diarrhea        [place]         weakness        red eye        abdominal pain         bruising          bruising or bleeding              joint pain           severe headache    Have you been in contact with someone who was or has been sick in the past 2 weeks?  NO  Yes                 Unsure                         Unable to assess   Does the person that you were in contact with have any of the following symptoms?   Cough         shortness of breath           muscle pain         vomiting,            diarrhea            [place]            weakness           fever            red eye           abdominal pain           bruising  or  bleeding                joint pain                severe headache                 COMMENTS OR ACTION PLAN FOR THIS PATIENT:        ALL QUESTIONS WERE ANSWERED/[facility] 

## 2019-11-27 ENCOUNTER — Ambulatory Visit (INDEPENDENT_AMBULATORY_CARE_PROVIDER_SITE_OTHER): Payer: Medicare Other | Admitting: Physician Assistant

## 2019-11-27 ENCOUNTER — Other Ambulatory Visit: Payer: Self-pay

## 2019-11-27 VITALS — BP 130/82 | HR 71 | Temp 97.3°F | Resp 18 | Ht 69.0 in | Wt 204.0 lb

## 2019-11-27 DIAGNOSIS — I770 Arteriovenous fistula, acquired: Secondary | ICD-10-CM | POA: Diagnosis not present

## 2019-11-27 NOTE — Progress Notes (Signed)
  POST OPERATIVE OFFICE NOTE    CC:  skin issues over AVF HPI:  This is a 59 y.o. male who is s/p left radial cephalic AV fistula creation in 2015.  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 Donzetta Matters.  He reports an area of concern on the distal fistula that was accessed proximately 2 weeks ago that was worrisome for hematoma and skin ulceration.  He currently states this area is much improved and he dialyzed on Saturday without complications.  Fever, chills or hand pain.  He has previously had an area of pseudoaneurysmal degenerationmore proximally that wasplicated on 01-25-66 by Dr. Donzetta Matters.  Pt dialyzes on TTS via left forearm AVF at [facility] on [place].  Allergies  Allergen Reactions  . Fentanyl Nausea Only  . Vancomycin Other (See Comments)    Red face ? Red Man Syndrome ?    Current Outpatient Medications  Medication Sig Dispense Refill  . acetaminophen (TYLENOL) 500 MG tablet Take 500-1,000 mg by mouth every 6 (six) hours as needed for moderate pain.    Marland Kitchen aspirin EC 81 MG tablet Take 81 mg by mouth every evening.    . bismuth subsalicylate (PEPTO BISMOL) 262 MG/15ML suspension Take 30 mLs by mouth every 6 (six) hours as needed for indigestion.    . cetirizine (ZYRTEC) 10 MG tablet Take 10 mg by mouth daily as needed for allergies.    . cinacalcet (SENSIPAR) 30 MG tablet Take 30 mg by mouth every evening.     . ferric citrate (AURYXIA) 1 GM 210 MG(Fe) tablet Take 210-630 mg by mouth 3 (three) times daily with meals. Take 630 mg with each meal and 210 - 420 mg with each snack    . fluticasone (FLONASE) 50 MCG/ACT nasal spray Place 2 sprays into both nostrils daily as needed for allergies.    . hydrocortisone cream 1 % Apply 1 application topically daily as needed for itching.    . multivitamin (RENA-VIT) TABS tablet Take 1 tablet by mouth every morning. (Patient taking differently: Take 1 tablet by  mouth every evening. ) 30 tablet PRN  . pantoprazole (PROTONIX) 40 MG tablet Take 40 mg by mouth every evening.      No current facility-administered medications for this visit.     ROS:  See HPI  Physical Exam: Vitals:   11/27/19 0905  Weight: 204 lb (92.5 kg)  Height: 5\' 9"  (1.753 m)   General appearance: Patient is well-developed well-nourished male in no apparent distress Extremities: Left forearm AV fistula with several areas of aneurysmal dilatation.  Good bruit and thrill.  Over 1 aneurysmal area of the skin is somewhat shiny, but intact without ulceration. Neuro: Alert and oriented x4      Assessment/Plan:  This is a 59 y.o. male who is s/p: Plication x2 of 2 separate aneurysmal areas of his left forearm radiocephalic AV fistula.  He was concerned several weeks ago of possible skin ulceration.  We discussed avoiding the area of shiny skin and continued monitoring.  He states he and his dialysis techs currently are avoiding this area for access.  -Offered 1 month follow-up.  We feel confident the patient and his dialysis staff will continue to monitor and refer him back to the clinic as needed   Barbie Banner, PA-C [facility] [phone]  Clinic MD: Trula Slade

## 2020-08-13 ENCOUNTER — Encounter: Payer: Self-pay | Admitting: Nephrology

## 2020-08-19 ENCOUNTER — Other Ambulatory Visit: Payer: Self-pay | Admitting: Nephrology

## 2020-08-21 ENCOUNTER — Other Ambulatory Visit: Payer: Self-pay | Admitting: Nurse Practitioner

## 2020-08-21 ENCOUNTER — Other Ambulatory Visit: Payer: Self-pay | Admitting: Nephrology

## 2020-08-21 ENCOUNTER — Ambulatory Visit
Admission: RE | Admit: 2020-08-21 | Discharge: 2020-08-21 | Disposition: A | Discharge status: Home / Self Care | Payer: Medicare (Managed Care) | Source: Ambulatory Visit | Attending: Nurse Practitioner | Admitting: Nurse Practitioner

## 2020-08-21 DIAGNOSIS — N289 Disorder of kidney and ureter, unspecified: Secondary | ICD-10-CM

## 2020-08-21 DIAGNOSIS — N2889 Other specified disorders of kidney and ureter: Secondary | ICD-10-CM

## 2020-08-21 MED ORDER — IOPAMIDOL (ISOVUE-300) INJECTION 61%
100.0000 mL | Freq: Once | INTRAVENOUS | Status: AC | PRN
  Administered 2020-08-21: 100 mL via INTRAVENOUS

## 2020-10-25 ENCOUNTER — Inpatient Hospital Stay (HOSPITAL_BASED_OUTPATIENT_CLINIC_OR_DEPARTMENT_OTHER)
Admission: EM | Admit: 2020-10-25 | Discharge: 2020-10-29 | DRG: 177 | Discharge status: Against Medical Advice | Payer: Medicare (Managed Care) | Attending: Internal Medicine | Admitting: Internal Medicine

## 2020-10-25 ENCOUNTER — Other Ambulatory Visit: Payer: Self-pay

## 2020-10-25 ENCOUNTER — Encounter (HOSPITAL_BASED_OUTPATIENT_CLINIC_OR_DEPARTMENT_OTHER): Payer: Self-pay | Admitting: *Deleted

## 2020-10-25 ENCOUNTER — Emergency Department (HOSPITAL_BASED_OUTPATIENT_CLINIC_OR_DEPARTMENT_OTHER): Payer: Medicare (Managed Care)

## 2020-10-25 DIAGNOSIS — D696 Thrombocytopenia, unspecified: Secondary | ICD-10-CM | POA: Diagnosis present

## 2020-10-25 DIAGNOSIS — Q613 Polycystic kidney, unspecified: Secondary | ICD-10-CM

## 2020-10-25 DIAGNOSIS — R410 Disorientation, unspecified: Secondary | ICD-10-CM | POA: Diagnosis not present

## 2020-10-25 DIAGNOSIS — J9691 Respiratory failure, unspecified with hypoxia: Secondary | ICD-10-CM | POA: Diagnosis present

## 2020-10-25 DIAGNOSIS — Z86718 Personal history of other venous thrombosis and embolism: Secondary | ICD-10-CM

## 2020-10-25 DIAGNOSIS — K439 Ventral hernia without obstruction or gangrene: Secondary | ICD-10-CM | POA: Diagnosis present

## 2020-10-25 DIAGNOSIS — Z8701 Personal history of pneumonia (recurrent): Secondary | ICD-10-CM

## 2020-10-25 DIAGNOSIS — K219 Gastro-esophageal reflux disease without esophagitis: Secondary | ICD-10-CM | POA: Diagnosis present

## 2020-10-25 DIAGNOSIS — E872 Acidosis, unspecified: Secondary | ICD-10-CM | POA: Diagnosis present

## 2020-10-25 DIAGNOSIS — Z992 Dependence on renal dialysis: Secondary | ICD-10-CM

## 2020-10-25 DIAGNOSIS — Z87891 Personal history of nicotine dependence: Secondary | ICD-10-CM

## 2020-10-25 DIAGNOSIS — N186 End stage renal disease: Secondary | ICD-10-CM | POA: Diagnosis present

## 2020-10-25 DIAGNOSIS — U071 COVID-19: Secondary | ICD-10-CM | POA: Diagnosis not present

## 2020-10-25 DIAGNOSIS — N2581 Secondary hyperparathyroidism of renal origin: Secondary | ICD-10-CM | POA: Diagnosis present

## 2020-10-25 DIAGNOSIS — I120 Hypertensive chronic kidney disease with stage 5 chronic kidney disease or end stage renal disease: Secondary | ICD-10-CM | POA: Diagnosis present

## 2020-10-25 DIAGNOSIS — Z8249 Family history of ischemic heart disease and other diseases of the circulatory system: Secondary | ICD-10-CM

## 2020-10-25 DIAGNOSIS — G9341 Metabolic encephalopathy: Secondary | ICD-10-CM | POA: Diagnosis present

## 2020-10-25 DIAGNOSIS — J9601 Acute respiratory failure with hypoxia: Secondary | ICD-10-CM | POA: Diagnosis present

## 2020-10-25 DIAGNOSIS — Z8271 Family history of polycystic kidney: Secondary | ICD-10-CM

## 2020-10-25 DIAGNOSIS — R0602 Shortness of breath: Secondary | ICD-10-CM

## 2020-10-25 DIAGNOSIS — Z885 Allergy status to narcotic agent status: Secondary | ICD-10-CM

## 2020-10-25 DIAGNOSIS — D649 Anemia, unspecified: Secondary | ICD-10-CM | POA: Diagnosis present

## 2020-10-25 DIAGNOSIS — M898X9 Other specified disorders of bone, unspecified site: Secondary | ICD-10-CM | POA: Diagnosis present

## 2020-10-25 DIAGNOSIS — Z79899 Other long term (current) drug therapy: Secondary | ICD-10-CM

## 2020-10-25 DIAGNOSIS — Z7982 Long term (current) use of aspirin: Secondary | ICD-10-CM

## 2020-10-25 DIAGNOSIS — R7401 Elevation of levels of liver transaminase levels: Secondary | ICD-10-CM | POA: Diagnosis present

## 2020-10-25 DIAGNOSIS — J1282 Pneumonia due to coronavirus disease 2019: Secondary | ICD-10-CM | POA: Diagnosis present

## 2020-10-25 DIAGNOSIS — Z881 Allergy status to other antibiotic agents status: Secondary | ICD-10-CM

## 2020-10-25 DIAGNOSIS — R17 Unspecified jaundice: Secondary | ICD-10-CM | POA: Diagnosis present

## 2020-10-25 DIAGNOSIS — Z5329 Procedure and treatment not carried out because of patient's decision for other reasons: Secondary | ICD-10-CM | POA: Diagnosis present

## 2020-10-25 LAB — COMPREHENSIVE METABOLIC PANEL
ALT: 254 U/L — ABNORMAL HIGH (ref 0–44)
AST: 276 U/L — ABNORMAL HIGH (ref 15–41)
Albumin: 4.2 g/dL (ref 3.5–5.0)
Alkaline Phosphatase: 74 U/L (ref 38–126)
Anion gap: 24 — ABNORMAL HIGH (ref 5–15)
BUN: 72 mg/dL — ABNORMAL HIGH (ref 6–20)
CO2: 26 mmol/L (ref 22–32)
Calcium: 8.8 mg/dL — ABNORMAL LOW (ref 8.9–10.3)
Chloride: 87 mmol/L — ABNORMAL LOW (ref 98–111)
Creatinine, Ser: 16.69 mg/dL — ABNORMAL HIGH (ref 0.61–1.24)
GFR, Estimated: 3 mL/min — ABNORMAL LOW (ref >60)
Glucose, Bld: 119 mg/dL — ABNORMAL HIGH (ref 70–99)
Potassium: 5.2 mmol/L — ABNORMAL HIGH (ref 3.5–5.1)
Sodium: 137 mmol/L (ref 135–145)
Total Bilirubin: 1.4 mg/dL — ABNORMAL HIGH (ref 0.3–1.2)
Total Protein: 7 g/dL (ref 6.5–8.1)

## 2020-10-25 LAB — CBC WITH DIFFERENTIAL/PLATELET
Abs Immature Granulocytes: 0.03 10*3/uL (ref 0.00–0.07)
Basophils Absolute: 0 10*3/uL (ref 0.0–0.1)
Basophils Relative: 0 %
Eosinophils Absolute: 0 10*3/uL (ref 0.0–0.5)
Eosinophils Relative: 0 %
HCT: 48.4 % (ref 39.0–52.0)
Hemoglobin: 16.4 g/dL (ref 13.0–17.0)
Immature Granulocytes: 1 %
Lymphocytes Relative: 18 %
Lymphs Abs: 0.9 10*3/uL (ref 0.7–4.0)
MCH: 32.2 pg (ref 26.0–34.0)
MCHC: 33.9 g/dL (ref 30.0–36.0)
MCV: 95.1 fL (ref 80.0–100.0)
Monocytes Absolute: 0.5 10*3/uL (ref 0.1–1.0)
Monocytes Relative: 10 %
Neutro Abs: 3.5 10*3/uL (ref 1.7–7.7)
Neutrophils Relative %: 71 %
Platelets: 108 10*3/uL — ABNORMAL LOW (ref 150–400)
RBC: 5.09 MIL/uL (ref 4.22–5.81)
RDW: 14.2 % (ref 11.5–15.5)
Smear Review: DECREASED
WBC: 5 10*3/uL (ref 4.0–10.5)
nRBC: 0 % (ref 0.0–0.2)

## 2020-10-25 LAB — ETHANOL: Alcohol, Ethyl (B): <10 mg/dL (ref <10)

## 2020-10-25 LAB — RESP PANEL BY RT-PCR (FLU A&B, COVID) ARPGX2
Influenza A by PCR: NEGATIVE
Influenza B by PCR: NEGATIVE
SARS Coronavirus 2 by RT PCR: POSITIVE — AB

## 2020-10-25 LAB — PROTIME-INR
INR: 1.3 — ABNORMAL HIGH (ref 0.8–1.2)
Prothrombin Time: 15.3 seconds — ABNORMAL HIGH (ref 11.4–15.2)

## 2020-10-25 LAB — AMMONIA: Ammonia: 19 umol/L (ref 9–35)

## 2020-10-25 LAB — MAGNESIUM: Magnesium: 2.1 mg/dL (ref 1.7–2.4)

## 2020-10-25 LAB — ACETAMINOPHEN LEVEL: Acetaminophen (Tylenol), Serum: <10 ug/mL — ABNORMAL LOW (ref 10–30)

## 2020-10-25 MED ORDER — METHYLPREDNISOLONE SODIUM SUCC 125 MG IJ SOLR
80.0000 mg | INTRAMUSCULAR | Status: DC
  Administered 2020-10-25: 80 mg via INTRAVENOUS
  Filled 2020-10-25: qty 2

## 2020-10-25 MED ORDER — SODIUM CHLORIDE 0.9 % IV SOLN
100.0000 mg | INTRAVENOUS | Status: AC
  Administered 2020-10-25 (×2): 100 mg via INTRAVENOUS

## 2020-10-25 MED ORDER — SODIUM CHLORIDE 0.9 % IV SOLN
100.0000 mg | Freq: Every day | INTRAVENOUS | Status: DC
  Administered 2020-10-26 – 2020-10-27 (×2): 100 mg via INTRAVENOUS
  Filled 2020-10-25 (×2): qty 20

## 2020-10-25 MED ORDER — ALBUTEROL SULFATE HFA 108 (90 BASE) MCG/ACT IN AERS
2.0000 | INHALATION_SPRAY | RESPIRATORY_TRACT | Status: DC | PRN
  Filled 2020-10-25: qty 6.7

## 2020-10-25 MED ORDER — DEXAMETHASONE SODIUM PHOSPHATE 10 MG/ML IJ SOLN
6.0000 mg | Freq: Once | INTRAMUSCULAR | Status: AC
  Administered 2020-10-25: 6 mg via INTRAVENOUS
  Filled 2020-10-25: qty 1

## 2020-10-25 NOTE — Progress Notes (Signed)
Patient SPO2 with a good wave form dropped to 82% while sleeping on RA.  Placed patient on 2 liter nasal cannula.  RT will continue to monitor.

## 2020-10-25 NOTE — ED Notes (Signed)
Returned from CT.

## 2020-10-25 NOTE — ED Notes (Signed)
Attempted to reach pt's family per MD request for clarification regarding covid status, no answer, MD notified

## 2020-10-25 NOTE — ED Notes (Signed)
Patient transported to CT 

## 2020-10-25 NOTE — ED Provider Notes (Signed)
[place] EMERGENCY DEPARTMENT Provider Note   CSN: [id number] Arrival date & time: 10/25/20  1908     History No chief complaint on file.   [name] [name] is a 60 y.o. male.  The history is provided by the patient and medical records.   [name] [name] is a 60 y.o. male who presents to the Emergency Department complaining of confusion. He presents the emergency department with complaints of waxing and waning confusion. 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 He dialyzed is at [facility] for sinuous. He normally dialysis Monday, Wednesday, Friday that he dialyzed yesterday in the COVID unit. He began feeling ill on Saturday with mild cough. He states he lives with his brother and his brother tested positive for COVID-19. The patient states that he has not been tested for COVID-19. He denies any fevers. He has mild shortness of breath, similar to his baseline. He has abdominal tenderness when he coughs. He denies any nausea, vomiting, diarrhea. He states that he is having frequent vivid dreams and feels like he is getting stuck in his dreams. At the time of ED presentation he states that he is feeling back near his baseline. He refused to go to dialysis today because he states he was incoherent.  Level V caveat due to confusion.      Past Medical History:  Diagnosis Date  . Anemia   . Anxiety    situational  . DVT (deep venous thrombosis) (HCC)    leg  . ESRD (end stage renal disease) ([place])    T[facility]SAT [place]  . GERD (gastroesophageal reflux disease)   . Hypertension   . Leucocytosis   . Polycystic kidney disease   . Shortness of breath     Patient Active Problem List   Diagnosis Date Noted  . Respiratory failure with hypoxia ([place]) 10/25/2020  . History of DVT (deep vein thrombosis) 05/16/2019  . CAP (community acquired pneumonia) 09/15/2018  . Sepsis ([place]) 09/14/2018  . End stage renal disease ([place]) 05/23/2014  . Metabolic acidosis 53/29/9242  . Essential  hypertension, benign 04/04/2014  . Acute encephalopathy 04/04/2014  . Polycystic kidney disease 04/02/2014  . Acute renal failure ([place]) 04/02/2014    Past Surgical History:  Procedure Laterality Date  . AV FISTULA PLACEMENT Left 04/05/2014   Procedure: CREATION OF ARTERIOVENOUS (AV) FISTULA ;  Surgeon: Angelia Mould, MD;  Location: [place];  Service: Vascular;  Laterality: Left;  . CENTRAL VENOUS CATHETER INSERTION  04/02/2014   DR Titus Mould  . COLONOSCOPY    . FISTULA SUPERFICIALIZATION Left 03/26/3418   Procedure: PLICATION OF ANEURYSM ARTERIOVENOUS FISTULA LEFT ARM;  Surgeon: Waynetta Sandy, MD;  Location: [place];  Service: Vascular;  Laterality: Left;  . INSERTION OF DIALYSIS CATHETER Right 04/05/2014   Procedure: ULTRASOUND GUIDED INSERTION OF DIALYSIS CATHETER;  Surgeon: Angelia Mould, MD;  Location: [place];  Service: Vascular;  Laterality: Right;  . NOSE SURGERY    . REVISON OF ARTERIOVENOUS FISTULA Left 04/09/2978   Procedure: PLICATION OF LEFT ARM ARTERIOVENOUS FISTULA;  Surgeon: Waynetta Sandy, MD;  Location: [facility] OR;  Service: Vascular;  Laterality: Left;       Family History  Problem Relation Age of Onset  . Polycystic kidney disease Father   . Hypertension Father   . Polycystic kidney disease Sister     Social History   Tobacco Use  . Smoking status: Former Research scientist (life sciences)  . Smokeless tobacco: Never Used  . Tobacco comment: QUIT SMOKING CIGARETTES  IN 1994      QUIT SMOKING POT   Vaping Use  . Vaping Use: Never used  Substance Use Topics  . Alcohol use: No  . Drug use: No    Home Medications Prior to Admission medications   Medication Sig Start Date End Date Taking? Authorizing Provider  acetaminophen (TYLENOL) 500 MG tablet Take 500-1,000 mg by mouth every 6 (six) hours as needed for moderate pain.    [provider]  aspirin EC 81 MG tablet Take 81 mg by mouth every evening.    [provider]  bismuth subsalicylate  (PEPTO BISMOL) 262 MG/15ML suspension Take 30 mLs by mouth every 6 (six) hours as needed for indigestion.    [provider]  cetirizine (ZYRTEC) 10 MG tablet Take 10 mg by mouth daily as needed for allergies.    [provider]  cinacalcet (SENSIPAR) 30 MG tablet Take 30 mg by mouth every evening.     [provider]  ferric citrate (AURYXIA) 1 GM 210 MG(Fe) tablet Take 210-630 mg by mouth 3 (three) times daily with meals. Take 630 mg with each meal and 210 - 420 mg with each snack    [provider]  fluticasone (FLONASE) 50 MCG/ACT nasal spray Place 2 sprays into both nostrils daily as needed for allergies. 12/15/17   [provider]  hydrocortisone cream 1 % Apply 1 application topically daily as needed for itching.    [provider]  multivitamin (RENA-VIT) TABS tablet Take 1 tablet by mouth every morning. Patient taking differently: Take 1 tablet by mouth every evening.  04/10/14   Ramiro Harvest, PA-C  pantoprazole (PROTONIX) 40 MG tablet Take 40 mg by mouth every evening.     [provider]    Allergies    Fentanyl and Vancomycin  Review of Systems   Review of Systems  All other systems reviewed and are negative.   Physical Exam Updated Vital Signs BP 138/75 (BP Location: Right Arm)   Pulse 94   Temp 98.2 F (36.8 C) (Oral)   Resp (!) 25   Ht $R'5\' 9"'hQ$  (1.753 m)   Wt 83.6 kg   SpO2 100%   BMI 27.22 kg/m   Physical Exam Vitals and nursing note reviewed.  Constitutional:      Appearance: He is well-developed and well-nourished.  HENT:     Head: Normocephalic and atraumatic.  Cardiovascular:     Rate and Rhythm: Normal rate and regular rhythm.     Heart sounds: No murmur heard.   Pulmonary:     Effort: Pulmonary effort is normal. No respiratory distress.     Breath sounds: Normal breath sounds.  Abdominal:     Palpations: Abdomen is soft.     Tenderness: There is no abdominal tenderness. There is no  guarding or rebound.     Comments: Hernia in the left upper quadrant that is easily reducible  Musculoskeletal:        General: No tenderness or edema.     Comments: Fistula in the left upper extremity with palpable thrill  Skin:    General: Skin is warm and dry.  Neurological:     Mental Status: He is alert and oriented to person, place, and time.     Comments: Five out of five strength in all four extremities with sensation light touch intact in all four extremities  Psychiatric:        Mood and Affect: Mood and affect normal.  Behavior: Behavior normal.     ED Results / Procedures / Treatments   Labs (all labs ordered are listed, but only abnormal results are displayed) Labs Reviewed  RESP PANEL BY RT-PCR (FLU A&B, COVID) ARPGX2 - Abnormal; Notable for the following components:      Result Value   SARS Coronavirus 2 by RT PCR POSITIVE (*)    All other components within normal limits  CBC WITH DIFFERENTIAL/PLATELET - Abnormal; Notable for the following components:   Platelets 108 (*)    All other components within normal limits  COMPREHENSIVE METABOLIC PANEL - Abnormal; Notable for the following components:   Potassium 5.2 (*)    Chloride 87 (*)    Glucose, Bld 119 (*)    BUN 72 (*)    Creatinine, Ser 16.69 (*)    Calcium 8.8 (*)    AST 276 (*)    ALT 254 (*)    Total Bilirubin 1.4 (*)    GFR, Estimated 3 (*)    Anion gap 24 (*)    All other components within normal limits  PROTIME-INR - Abnormal; Notable for the following components:   Prothrombin Time 15.3 (*)    INR 1.3 (*)    All other components within normal limits  ACETAMINOPHEN LEVEL - Abnormal; Notable for the following components:   Acetaminophen (Tylenol), Serum <10 (*)    All other components within normal limits  ETHANOL  MAGNESIUM  AMMONIA  CBC  COMPREHENSIVE METABOLIC PANEL    EKG EKG Interpretation  Date/Time:  Friday October 25 2020 19:28:57 EST Ventricular Rate:  96 PR Interval:     QRS Duration: 96 QT Interval:  394 QTC Calculation: 498 R Axis:   110 Text Interpretation: Sinus rhythm Consider left ventricular hypertrophy Lateral infarct, age indeterminate Confirmed by Quintella Reichert [phone]) on 10/25/2020 8:39:53 PM   Radiology CT Head Wo Contrast  Result Date: 10/25/2020 CLINICAL DATA:  60 year old male with altered mental status. EXAM: CT HEAD WITHOUT CONTRAST TECHNIQUE: Contiguous axial images were obtained from the base of the skull through the vertex without intravenous contrast. COMPARISON:  None. FINDINGS: Evaluation of this exam is limited due to motion artifact. Brain: The ventricles and sulci appropriate size for patient's age. The gray-white matter discrimination is preserved. There is no acute intracranial hemorrhage. No mass effect or midline shift. No extra-axial fluid collection. Vascular: No hyperdense vessel or unexpected calcification. Skull: Normal. Negative for fracture or focal lesion. Sinuses/Orbits: No acute finding. Other: None IMPRESSION: No acute intracranial pathology. Electronically Signed   By: Anner Crete M.D.   On: 10/25/2020 20:23   DG Chest Port 1 View  Result Date: 10/25/2020 CLINICAL DATA:  Altered mental status.  COVID positive. EXAM: PORTABLE CHEST 1 VIEW COMPARISON:  09/14/2018 FINDINGS: Symmetric low lung volumes. Patchy and heterogeneous bilateral lung opacities. Upper normal heart size with unchanged mediastinal contours. There may be a vascular congestion. No significant pleural effusion. No pneumothorax. No acute osseous abnormalities are seen. IMPRESSION: 1. Patchy and heterogeneous bilateral lung opacities, pattern typical of COVID-19 pneumonia. 2. Possible vascular congestion. Electronically Signed   By: Keith Rake M.D.   On: 10/25/2020 20:15    Procedures Procedures (including critical care time) CRITICAL CARE Performed by: Quintella Reichert   Total critical care time: 35 minutes  Critical care time was exclusive of  separately billable procedures and treating other patients.  Critical care was necessary to treat or prevent imminent or life-threatening deterioration.  Critical care was time spent personally by me  on the following activities: development of treatment plan with patient and/or surrogate as well as nursing, discussions with consultants, evaluation of patient's response to treatment, examination of patient, obtaining history from patient or surrogate, ordering and performing treatments and interventions, ordering and review of laboratory studies, ordering and review of radiographic studies, pulse oximetry and re-evaluation of patient's condition.  Medications Ordered in ED Medications  albuterol (VENTOLIN HFA) 108 (90 Base) MCG/ACT inhaler 2 puff (has no administration in time range)  methylPREDNISolone sodium succinate (SOLU-MEDROL) 125 mg/2 mL injection 80 mg (80 mg Intravenous Given 10/25/20 2251)  remdesivir 100 mg in sodium chloride 0.9 % 100 mL IVPB (100 mg Intravenous New Bag/Given 10/25/20 2256)  remdesivir 100 mg in sodium chloride 0.9 % 100 mL IVPB (has no administration in time range)  dexamethasone (DECADRON) injection 6 mg (6 mg Intravenous Given 10/25/20 2124)    ED Course  I have reviewed the triage vital signs and the nursing notes.  Pertinent labs & imaging results that were available during my care of the patient were reviewed by me and considered in my medical decision making (see chart for details).    MDM Rules/Calculators/A&P                         patient with end-stage renal disease on hemodialysis here for evaluation of increased confusion. Per patient he has COVID but has not been tested. He is mildly confused on examination with no focal neurologic deficits. CT head is negative for acute abnormality. Chest x-ray with infiltrates consistent with COVID-19 as well as mild pulmonary vascular congestion. Labs with mild elevation in his potassium to 5.2. EKG is without acute  ischemic changes. Patient with episodes of the saturations to the low 80s, likely contributing to his waxing and waning confusion. He was placed on supplemental oxygen with improvement in his oxygen sats in the mid 90s. He was treated with Decadron for COVID related hypoxia. Discussed with patient findings of studies and recommendation for mission and he is in agreement treatment plan. Hospitalist consulted for admission. Attempted to contact patient's family for additional information, no answer.  [name] [name] was evaluated in Emergency Department on 10/25/2020 for the symptoms described in the history of present illness. He was evaluated in the context of the global COVID-19 pandemic, which necessitated consideration that the patient might be at risk for infection with the SARS-CoV-2 virus that causes COVID-19. Institutional protocols and algorithms that pertain to the evaluation of patients at risk for COVID-19 are in a state of rapid change based on information released by regulatory bodies including the CDC and federal and state organizations. These policies and algorithms were followed during the patient's care in the ED.   Final Clinical Impression(s) / ED Diagnoses Final diagnoses:  Pneumonia due to COVID-19 virus  Confusion  ESRD (end stage renal disease) on dialysis [facility])    Rx / [place] Orders ED Discharge Orders    None       Quintella Reichert, MD 10/25/20 2323

## 2020-10-25 NOTE — ED Notes (Signed)
Pt in CT.

## 2020-10-25 NOTE — ED Notes (Signed)
Pt with saO2 of 83 percent with good wave form, pt sleeping, placed on 2L O2/[place] with good response of sao2 of 98 percent

## 2020-10-25 NOTE — ED Triage Notes (Addendum)
States he has been incoherent since yesterday. He got stuck in a dream. His brother drove him here. Denies SI. He is alert and oriented. He is a dialysis pt. States he refused his dialysis today due to being "incoherent".

## 2020-10-25 NOTE — ED Notes (Signed)
Contact #, Len Sura, pt's brother [phone].  Please call when pt is transferred to [facility]   If unavailable please call pt's sister Vida Roller at [phone]

## 2020-10-26 DIAGNOSIS — K219 Gastro-esophageal reflux disease without esophagitis: Secondary | ICD-10-CM | POA: Diagnosis present

## 2020-10-26 DIAGNOSIS — N186 End stage renal disease: Secondary | ICD-10-CM

## 2020-10-26 DIAGNOSIS — Z8701 Personal history of pneumonia (recurrent): Secondary | ICD-10-CM | POA: Diagnosis not present

## 2020-10-26 DIAGNOSIS — Z992 Dependence on renal dialysis: Secondary | ICD-10-CM | POA: Diagnosis not present

## 2020-10-26 DIAGNOSIS — J1282 Pneumonia due to coronavirus disease 2019: Secondary | ICD-10-CM

## 2020-10-26 DIAGNOSIS — M898X9 Other specified disorders of bone, unspecified site: Secondary | ICD-10-CM | POA: Diagnosis present

## 2020-10-26 DIAGNOSIS — Z79899 Other long term (current) drug therapy: Secondary | ICD-10-CM | POA: Diagnosis not present

## 2020-10-26 DIAGNOSIS — I120 Hypertensive chronic kidney disease with stage 5 chronic kidney disease or end stage renal disease: Secondary | ICD-10-CM | POA: Diagnosis present

## 2020-10-26 DIAGNOSIS — R410 Disorientation, unspecified: Secondary | ICD-10-CM | POA: Diagnosis present

## 2020-10-26 DIAGNOSIS — U071 COVID-19: Principal | ICD-10-CM

## 2020-10-26 DIAGNOSIS — Z7982 Long term (current) use of aspirin: Secondary | ICD-10-CM | POA: Diagnosis not present

## 2020-10-26 DIAGNOSIS — E872 Acidosis: Secondary | ICD-10-CM

## 2020-10-26 DIAGNOSIS — Z885 Allergy status to narcotic agent status: Secondary | ICD-10-CM | POA: Diagnosis not present

## 2020-10-26 DIAGNOSIS — D696 Thrombocytopenia, unspecified: Secondary | ICD-10-CM | POA: Diagnosis present

## 2020-10-26 DIAGNOSIS — J9601 Acute respiratory failure with hypoxia: Secondary | ICD-10-CM

## 2020-10-26 DIAGNOSIS — Z87891 Personal history of nicotine dependence: Secondary | ICD-10-CM | POA: Diagnosis not present

## 2020-10-26 DIAGNOSIS — R7989 Other specified abnormal findings of blood chemistry: Secondary | ICD-10-CM | POA: Diagnosis not present

## 2020-10-26 DIAGNOSIS — Q613 Polycystic kidney, unspecified: Secondary | ICD-10-CM

## 2020-10-26 DIAGNOSIS — N2581 Secondary hyperparathyroidism of renal origin: Secondary | ICD-10-CM | POA: Diagnosis present

## 2020-10-26 DIAGNOSIS — G9341 Metabolic encephalopathy: Secondary | ICD-10-CM | POA: Diagnosis present

## 2020-10-26 DIAGNOSIS — D649 Anemia, unspecified: Secondary | ICD-10-CM | POA: Diagnosis present

## 2020-10-26 DIAGNOSIS — Z881 Allergy status to other antibiotic agents status: Secondary | ICD-10-CM | POA: Diagnosis not present

## 2020-10-26 DIAGNOSIS — R17 Unspecified jaundice: Secondary | ICD-10-CM | POA: Diagnosis present

## 2020-10-26 DIAGNOSIS — Z86718 Personal history of other venous thrombosis and embolism: Secondary | ICD-10-CM | POA: Diagnosis not present

## 2020-10-26 DIAGNOSIS — R7401 Elevation of levels of liver transaminase levels: Secondary | ICD-10-CM

## 2020-10-26 DIAGNOSIS — K439 Ventral hernia without obstruction or gangrene: Secondary | ICD-10-CM | POA: Diagnosis present

## 2020-10-26 DIAGNOSIS — Z5329 Procedure and treatment not carried out because of patient's decision for other reasons: Secondary | ICD-10-CM | POA: Diagnosis present

## 2020-10-26 LAB — TYPE AND SCREEN
ABO/RH(D): A POS
Antibody Screen: NEGATIVE

## 2020-10-26 LAB — CBC
HCT: 44.5 % (ref 39.0–52.0)
Hemoglobin: 15.6 g/dL (ref 13.0–17.0)
MCH: 32.5 pg (ref 26.0–34.0)
MCHC: 35.1 g/dL (ref 30.0–36.0)
MCV: 92.7 fL (ref 80.0–100.0)
Platelets: 83 10*3/uL — ABNORMAL LOW (ref 150–400)
RBC: 4.8 MIL/uL (ref 4.22–5.81)
RDW: 14.1 % (ref 11.5–15.5)
WBC: 3.2 10*3/uL — ABNORMAL LOW (ref 4.0–10.5)
nRBC: 0 % (ref 0.0–0.2)

## 2020-10-26 LAB — FERRITIN: Ferritin: 5114 ng/mL — ABNORMAL HIGH (ref 24–336)

## 2020-10-26 LAB — COMPREHENSIVE METABOLIC PANEL
ALT: 218 U/L — ABNORMAL HIGH (ref 0–44)
AST: 222 U/L — ABNORMAL HIGH (ref 15–41)
Albumin: 3.4 g/dL — ABNORMAL LOW (ref 3.5–5.0)
Alkaline Phosphatase: 71 U/L (ref 38–126)
Anion gap: 25 — ABNORMAL HIGH (ref 5–15)
BUN: 75 mg/dL — ABNORMAL HIGH (ref 6–20)
CO2: 24 mmol/L (ref 22–32)
Calcium: 8.7 mg/dL — ABNORMAL LOW (ref 8.9–10.3)
Chloride: 89 mmol/L — ABNORMAL LOW (ref 98–111)
Creatinine, Ser: 17.81 mg/dL — ABNORMAL HIGH (ref 0.61–1.24)
GFR, Estimated: 3 mL/min — ABNORMAL LOW (ref >60)
Glucose, Bld: 137 mg/dL — ABNORMAL HIGH (ref 70–99)
Potassium: 6 mmol/L — ABNORMAL HIGH (ref 3.5–5.1)
Sodium: 138 mmol/L (ref 135–145)
Total Bilirubin: 1.6 mg/dL — ABNORMAL HIGH (ref 0.3–1.2)
Total Protein: 6 g/dL — ABNORMAL LOW (ref 6.5–8.1)

## 2020-10-26 LAB — LACTATE DEHYDROGENASE: LDH: 463 U/L — ABNORMAL HIGH (ref 98–192)

## 2020-10-26 LAB — C-REACTIVE PROTEIN: CRP: 3.7 mg/dL — ABNORMAL HIGH (ref <1.0)

## 2020-10-26 LAB — MRSA PCR SCREENING: MRSA by PCR: NEGATIVE

## 2020-10-26 LAB — D-DIMER, QUANTITATIVE: D-Dimer, Quant: 2.12 ug/mL-FEU — ABNORMAL HIGH (ref 0.00–0.50)

## 2020-10-26 LAB — FIBRINOGEN: Fibrinogen: 357 mg/dL (ref 210–475)

## 2020-10-26 LAB — PROCALCITONIN: Procalcitonin: 14.13 ng/mL

## 2020-10-26 LAB — HIV ANTIBODY (ROUTINE TESTING W REFLEX): HIV Screen 4th Generation wRfx: NONREACTIVE

## 2020-10-26 LAB — HEPATITIS B SURFACE ANTIGEN: Hepatitis B Surface Ag: NONREACTIVE

## 2020-10-26 LAB — TSH: TSH: 0.276 u[IU]/mL — ABNORMAL LOW (ref 0.350–4.500)

## 2020-10-26 LAB — BRAIN NATRIURETIC PEPTIDE: B Natriuretic Peptide: >4500 pg/mL — ABNORMAL HIGH (ref 0.0–100.0)

## 2020-10-26 LAB — GLUCOSE, CAPILLARY: Glucose-Capillary: 148 mg/dL — ABNORMAL HIGH (ref 70–99)

## 2020-10-26 MED ORDER — METHYLPREDNISOLONE SODIUM SUCC 125 MG IJ SOLR
0.5000 mg/kg | Freq: Two times a day (BID) | INTRAMUSCULAR | Status: DC
  Administered 2020-10-26 – 2020-10-27 (×3): 41.875 mg via INTRAVENOUS
  Filled 2020-10-26 (×3): qty 2

## 2020-10-26 MED ORDER — CINACALCET HCL 30 MG PO TABS
30.0000 mg | ORAL_TABLET | Freq: Every evening | ORAL | Status: DC
  Administered 2020-10-26 – 2020-10-28 (×3): 30 mg via ORAL
  Filled 2020-10-26 (×3): qty 1

## 2020-10-26 MED ORDER — HYDROCOD POLST-CPM POLST ER 10-8 MG/5ML PO SUER: 5.0000 mL | Freq: Two times a day (BID) | ORAL | Status: DC | PRN

## 2020-10-26 MED ORDER — HEPARIN SODIUM (PORCINE) 1000 UNIT/ML DIALYSIS: 1000.0000 [IU] | INTRAMUSCULAR | Status: DC | PRN

## 2020-10-26 MED ORDER — DIPHENHYDRAMINE HCL 25 MG PO CAPS
25.0000 mg | ORAL_CAPSULE | Freq: Every evening | ORAL | Status: DC | PRN
  Administered 2020-10-26: 25 mg via ORAL
  Filled 2020-10-26: qty 1

## 2020-10-26 MED ORDER — HYDROCORTISONE 1 % EX CREA
1.0000 "application " | TOPICAL_CREAM | Freq: Every day | CUTANEOUS | Status: DC | PRN
  Filled 2020-10-26: qty 28

## 2020-10-26 MED ORDER — LIDOCAINE HCL (PF) 1 % IJ SOLN
5.0000 mL | INTRAMUSCULAR | Status: DC | PRN
  Filled 2020-10-26: qty 5

## 2020-10-26 MED ORDER — FLUTICASONE PROPIONATE 50 MCG/ACT NA SUSP
2.0000 | Freq: Every day | NASAL | Status: DC | PRN
  Filled 2020-10-26: qty 16

## 2020-10-26 MED ORDER — ACETAMINOPHEN 500 MG PO TABS
500.0000 mg | ORAL_TABLET | Freq: Every evening | ORAL | Status: DC | PRN
  Administered 2020-10-26: 500 mg via ORAL
  Filled 2020-10-26: qty 1

## 2020-10-26 MED ORDER — GUAIFENESIN-DM 100-10 MG/5ML PO SYRP
10.0000 mL | ORAL_SOLUTION | ORAL | Status: DC | PRN
  Administered 2020-10-27: 10 mL via ORAL
  Filled 2020-10-26: qty 10

## 2020-10-26 MED ORDER — SODIUM CHLORIDE 0.9% FLUSH
3.0000 mL | Freq: Two times a day (BID) | INTRAVENOUS | Status: DC
  Administered 2020-10-26 – 2020-10-29 (×7): 3 mL via INTRAVENOUS

## 2020-10-26 MED ORDER — LIDOCAINE HCL (PF) 1 % IJ SOLN: 5.0000 mL | INTRAMUSCULAR | Status: DC | PRN

## 2020-10-26 MED ORDER — PREDNISONE 5 MG PO TABS: 50.0000 mg | ORAL_TABLET | Freq: Every day | ORAL | Status: DC

## 2020-10-26 MED ORDER — SODIUM CHLORIDE 0.9 % IV SOLN
2.0000 g | INTRAVENOUS | Status: DC
  Administered 2020-10-26 – 2020-10-28 (×3): 2 g via INTRAVENOUS
  Filled 2020-10-26 (×3): qty 20

## 2020-10-26 MED ORDER — ORAL CARE MOUTH RINSE
15.0000 mL | Freq: Two times a day (BID) | OROMUCOSAL | Status: DC
  Administered 2020-10-26 – 2020-10-29 (×7): 15 mL via OROMUCOSAL

## 2020-10-26 MED ORDER — ACETAMINOPHEN 325 MG PO TABS
650.0000 mg | ORAL_TABLET | Freq: Four times a day (QID) | ORAL | Status: DC | PRN
  Administered 2020-10-26 – 2020-10-27 (×2): 650 mg via ORAL
  Filled 2020-10-26 (×2): qty 2

## 2020-10-26 MED ORDER — FERRIC CITRATE 1 GM 210 MG(FE) PO TABS
210.0000 mg | ORAL_TABLET | Freq: Three times a day (TID) | ORAL | Status: DC
  Administered 2020-10-26 – 2020-10-27 (×2): 630 mg via ORAL
  Filled 2020-10-26: qty 3
  Filled 2020-10-26: qty 2
  Filled 2020-10-26: qty 1

## 2020-10-26 MED ORDER — SODIUM CHLORIDE 0.9 % IV SOLN: 100.0000 mL | INTRAVENOUS | Status: DC | PRN

## 2020-10-26 MED ORDER — HEPARIN SODIUM (PORCINE) 1000 UNIT/ML DIALYSIS
5000.0000 [IU] | Freq: Once | INTRAMUSCULAR | Status: DC
  Filled 2020-10-26: qty 5

## 2020-10-26 MED ORDER — NEPRO/CARBSTEADY PO LIQD
237.0000 mL | Freq: Two times a day (BID) | ORAL | Status: DC
  Administered 2020-10-26 – 2020-10-29 (×4): 237 mL via ORAL

## 2020-10-26 MED ORDER — ASCORBIC ACID 500 MG PO TABS
500.0000 mg | ORAL_TABLET | Freq: Every day | ORAL | Status: DC
  Administered 2020-10-26 – 2020-10-29 (×4): 500 mg via ORAL
  Filled 2020-10-26 (×4): qty 1

## 2020-10-26 MED ORDER — FAMOTIDINE 20 MG PO TABS
10.0000 mg | ORAL_TABLET | Freq: Every day | ORAL | Status: DC
  Administered 2020-10-26 – 2020-10-29 (×4): 10 mg via ORAL
  Filled 2020-10-26 (×4): qty 1

## 2020-10-26 MED ORDER — PENTAFLUOROPROP-TETRAFLUOROETH EX AERO: 1.0000 "application " | INHALATION_SPRAY | CUTANEOUS | Status: DC | PRN

## 2020-10-26 MED ORDER — ZINC SULFATE 220 (50 ZN) MG PO CAPS
220.0000 mg | ORAL_CAPSULE | Freq: Every day | ORAL | Status: DC
  Administered 2020-10-26 – 2020-10-29 (×4): 220 mg via ORAL
  Filled 2020-10-26 (×4): qty 1

## 2020-10-26 MED ORDER — LIDOCAINE-PRILOCAINE 2.5-2.5 % EX CREA
1.0000 "application " | TOPICAL_CREAM | CUTANEOUS | Status: DC | PRN
  Filled 2020-10-26: qty 5

## 2020-10-26 MED ORDER — HEPARIN SODIUM (PORCINE) 5000 UNIT/ML IJ SOLN
5000.0000 [IU] | Freq: Three times a day (TID) | INTRAMUSCULAR | Status: DC
  Administered 2020-10-26 – 2020-10-29 (×10): 5000 [IU] via SUBCUTANEOUS
  Filled 2020-10-26 (×10): qty 1

## 2020-10-26 MED ORDER — DIPHENHYDRAMINE-APAP (SLEEP) 25-500 MG PO TABS: 1.0000 | ORAL_TABLET | Freq: Every evening | ORAL | Status: DC | PRN

## 2020-10-26 MED ORDER — CHLORHEXIDINE GLUCONATE CLOTH 2 % EX PADS
6.0000 | MEDICATED_PAD | Freq: Every day | CUTANEOUS | Status: DC
  Administered 2020-10-26 – 2020-10-28 (×3): 6 via TOPICAL

## 2020-10-26 NOTE — Progress Notes (Signed)
Patient admitted from OSH. Pt placed on telemetry and VSS. Pt transferred on 2L nasal cannula.  Patient oriented to room.  Admission navigator completed.  Patient states he "skipped HD Friday because his dry weight is low."  Patient prefers for his sister [name] [name] to be his main point of contact for the care team.  Her cell phone number is [phone].

## 2020-10-26 NOTE — Consult Note (Signed)
[facility] Renal Consultation Note    Indication for Consultation:  Management of ESRD/hemodialysis; anemia, hypertension/volume and secondary hyperparathyroidism   HPI: [name] [name] is a 60 y.o. 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 Afebrile. Hypoxic on arrival now maintaining O2 sats 96% on 2L O2. CXR shows bilateral lung opacities/PNA.  Head CT negative.  Labs notable for K 6.0, BUN 75 Cr 17.8 WBC 3.2 Hgb 15.6 /Elevated inflammatory markers. Covid screen positive. Remdesivir, steroids started by primary team.  Seen and examined in room. Feels terrible. Has been feeling sick for past week. Family members sick. Family noticed he was becoming more confused and brought to the ED. Unclear date he initially tested positive but has been on MWF Covid shift at his dialysis center since 1/3. His last dialysis was 1/5. Left 2.3kg under his dry weight.    Past Medical History:  Diagnosis Date  . Anemia   . Anxiety    situational  . DVT (deep venous thrombosis) (HCC)    leg  . ESRD (end stage renal disease) ([place])    TTHSAT [place]  . GERD (gastroesophageal reflux disease)   . Hypertension   . Leucocytosis   . Polycystic kidney disease   . Shortness of breath    Past Surgical History:  Procedure Laterality Date  . AV FISTULA PLACEMENT Left 04/05/2014   Procedure: CREATION OF ARTERIOVENOUS (AV) FISTULA ;  Surgeon: Angelia Mould, MD;  Location: [place];  Service: Vascular;  Laterality: Left;  . CENTRAL VENOUS CATHETER INSERTION  04/02/2014   DR Titus Mould  . COLONOSCOPY    . FISTULA SUPERFICIALIZATION Left 10/27/1476   Procedure: PLICATION OF ANEURYSM ARTERIOVENOUS FISTULA LEFT ARM;  Surgeon: Waynetta Sandy, MD;  Location: [place];  Service: Vascular;  Laterality: Left;  . INSERTION OF DIALYSIS CATHETER Right 04/05/2014   Procedure: ULTRASOUND GUIDED INSERTION OF DIALYSIS  CATHETER;  Surgeon: Angelia Mould, MD;  Location: [place];  Service: Vascular;  Laterality: Right;  . NOSE SURGERY    . REVISON OF ARTERIOVENOUS FISTULA Left 2/95/6213   Procedure: PLICATION OF LEFT ARM ARTERIOVENOUS FISTULA;  Surgeon: Waynetta Sandy, MD;  Location: [facility] OR;  Service: Vascular;  Laterality: Left;   Family History  Problem Relation Age of Onset  . Polycystic kidney disease Father   . Hypertension Father   . Polycystic kidney disease Sister    Social History:  reports that he has quit smoking. He has never used smokeless tobacco. He reports that he does not drink alcohol and does not use drugs. Allergies  Allergen Reactions  . Fentanyl Nausea Only  . Vancomycin Other (See Comments)    Red face ? Red Man Syndrome ?   Prior to Admission medications   Medication Sig Start Date End Date Taking? Authorizing Provider  acetaminophen (TYLENOL) 500 MG tablet Take 500-1,000 mg by mouth every 6 (six) hours as needed for moderate pain.   Yes [provider]  cinacalcet (SENSIPAR) 30 MG tablet Take 30 mg by mouth every evening.    Yes [provider]  diphenhydramine-acetaminophen (TYLENOL PM) 25-500 MG TABS tablet Take 1 tablet by mouth at bedtime as needed.   Yes [provider]  ferric citrate (AURYXIA) 1 GM 210 MG(Fe) tablet Take 210-630 mg by mouth 3 (three) times daily with meals. Take 630 mg with each meal and 210 - 420 mg with each snack   Yes [provider]  fluticasone (FLONASE) 50 MCG/ACT nasal spray Place 2 sprays into both nostrils daily as needed for allergies. 12/15/17  Yes [provider]  hydrocortisone cream 1 % Apply 1 application topically daily as needed for itching.   Yes [provider]  multivitamin (RENA-VIT) TABS tablet Take 1 tablet by mouth every morning. Patient taking differently: Take 1 tablet by mouth every evening. 04/10/14  Yes Lyles, Charles, PA-C  pantoprazole (PROTONIX) 40 MG  tablet Take 40 mg by mouth every evening.    Yes [provider]   Current Facility-Administered Medications  Medication Dose Route Frequency Provider Last Rate Last Admin  . acetaminophen (TYLENOL) tablet 650 mg  650 mg Oral Q6H PRN Fuller Plan A, MD   650 mg at 10/26/20 0941  . albuterol (VENTOLIN HFA) 108 (90 Base) MCG/ACT inhaler 2 puff  2 puff Inhalation Q4H PRN Smith, Rondell A, MD      . ascorbic acid (VITAMIN C) tablet 500 mg  500 mg Oral Daily Tamala Julian, Rondell A, MD   500 mg at 10/26/20 0844  . Chlorhexidine Gluconate Cloth 2 % PADS 6 each  6 each Topical Q0600 Lynnda Child, PA-C   6 each at 10/26/20 [phone]  . chlorpheniramine-HYDROcodone (TUSSIONEX) 10-8 MG/5ML suspension 5 mL  5 mL Oral Q12H PRN Fuller Plan A, MD      . famotidine (PEPCID) tablet 10 mg  10 mg Oral Daily Tamala Julian, Rondell A, MD   10 mg at 10/26/20 0844  . feeding supplement (NEPRO CARB STEADY) liquid 237 mL  237 mL Oral BID BM Smith, Rondell A, MD      . guaiFENesin-dextromethorphan (ROBITUSSIN DM) 100-10 MG/5ML syrup 10 mL  10 mL Oral Q4H PRN Tamala Julian, Rondell A, MD      . heparin injection 5,000 Units  5,000 Units Subcutaneous Q8H Fuller Plan A, MD   5,000 Units at 10/26/20 0846  . MEDLINE mouth rinse  15 mL Mouth Rinse BID Tamala Julian, Rondell A, MD   15 mL at 10/26/20 1028  . methylPREDNISolone sodium succinate (SOLU-MEDROL) 125 mg/2 mL injection 41.875 mg  0.5 mg/kg Intravenous Q12H Fuller Plan A, MD   41.875 mg at 10/26/20 0846   Followed by  . [START ON 10/29/2020] predniSONE (DELTASONE) tablet 50 mg  50 mg Oral Daily Smith, Rondell A, MD      . methylPREDNISolone sodium succinate (SOLU-MEDROL) 125 mg/2 mL injection 80 mg  80 mg Intravenous Q24H Zierle-Ghosh, Asia B, DO   80 mg at 10/25/20 2251  . remdesivir 100 mg in sodium chloride 0.9 % 100 mL IVPB  100 mg Intravenous Daily Tamala Julian, Rondell A, MD 200 mL/hr at 10/26/20 0859 100 mg at 10/26/20 0859  . sodium chloride flush (NS) 0.9 % injection 3 mL  3  mL Intravenous Q12H Smith, Rondell A, MD   3 mL at 10/26/20 0852  . zinc sulfate capsule 220 mg  220 mg Oral Daily Smith, Rondell A, MD   220 mg at 10/26/20 0844     ROS: As per HPI otherwise negative.  Physical Exam: Vitals:   10/26/20 0200 10/26/20 0320 10/26/20 0434 10/26/20 0747  BP: 128/88 (!) 133/96 (!) 133/96 111/76  Pulse: 87 85 85 72  Resp: (!) 22 (!) 21 (!) 23 (!) 24  Temp:  (!) 97.4 F (36.3 C) 97.8 F (36.6 C) 98.4 F (36.9 C)  TempSrc:  Oral Axillary Oral  SpO2: 94% 96% 96% 97%  Weight:      Height:  General: Ill appearing, nad on nasal O2  Head: NCAT sclera not icteric Neck: Supple. No JVD appreciated  Lungs: Normal WOB. Faint crackles at bases, bilaterally  Heart: RRR S1,S2  Abdomen: soft non-tender  Lower extremities:without edema or ischemic changes, no open wounds  Neuro: A & O  X 3. Moves all extremities spontaneously. Psych:  Responds to questions appropriately with a normal affect. Dialysis Access: LUE AVF +bruit   Labs: Basic Metabolic Panel: Recent Labs  Lab 10/25/20 1956 10/26/20 0341  NA 137 138  K 5.2* 6.0*  CL 87* 89*  CO2 26 24  GLUCOSE 119* 137*  BUN 72* 75*  CREATININE 16.69* 17.81*  CALCIUM 8.8* 8.7*   Liver Function Tests: Recent Labs  Lab 10/25/20 1956 10/26/20 0341  AST 276* 222*  ALT 254* 218*  ALKPHOS 74 71  BILITOT 1.4* 1.6*  PROT 7.0 6.0*  ALBUMIN 4.2 3.4*   No results for input(s): LIPASE, AMYLASE in the last 168 hours. Recent Labs  Lab 10/25/20 2113  AMMONIA 19   CBC: Recent Labs  Lab 10/25/20 1956 10/26/20 0341  WBC 5.0 3.2*  NEUTROABS 3.5  --   HGB 16.4 15.6  HCT 48.4 44.5  MCV 95.1 92.7  PLT 108* 83*   Cardiac Enzymes: No results for input(s): CKTOTAL, CKMB, CKMBINDEX, TROPONINI in the last 168 hours. CBG: No results for input(s): GLUCAP in the last 168 hours. Iron Studies:  Recent Labs    10/26/20 0341  FERRITIN 5,114*   Studies/Results: CT Head Wo Contrast  Result Date:  10/25/2020 CLINICAL DATA:  60 year old male with altered mental status. EXAM: CT HEAD WITHOUT CONTRAST TECHNIQUE: Contiguous axial images were obtained from the base of the skull through the vertex without intravenous contrast. COMPARISON:  None. FINDINGS: Evaluation of this exam is limited due to motion artifact. Brain: The ventricles and sulci appropriate size for patient's age. The gray-white matter discrimination is preserved. There is no acute intracranial hemorrhage. No mass effect or midline shift. No extra-axial fluid collection. Vascular: No hyperdense vessel or unexpected calcification. Skull: Normal. Negative for fracture or focal lesion. Sinuses/Orbits: No acute finding. Other: None IMPRESSION: No acute intracranial pathology. Electronically Signed   By: Anner Crete M.D.   On: 10/25/2020 20:23   DG Chest Port 1 View  Result Date: 10/25/2020 CLINICAL DATA:  Altered mental status.  COVID positive. EXAM: PORTABLE CHEST 1 VIEW COMPARISON:  09/14/2018 FINDINGS: Symmetric low lung volumes. Patchy and heterogeneous bilateral lung opacities. Upper normal heart size with unchanged mediastinal contours. There may be a vascular congestion. No significant pleural effusion. No pneumothorax. No acute osseous abnormalities are seen. IMPRESSION: 1. Patchy and heterogeneous bilateral lung opacities, pattern typical of COVID-19 pneumonia. 2. Possible vascular congestion. Electronically Signed   By: Keith Rake M.D.   On: 10/25/2020 20:15    Dialysis Orders:  NW TTS 4h 400/800 2K/2Ca EDW 86 kg (leaving below EDW post wt 83.7 on 1/5)  AVF Hep 5600 Hectorol 3 TIW Auryixa 3 qac, Sensipar 30 qd   Assessment/Plan: 1. COVID-19 PNA. Remdesivir, steroids per primary team.  2. Acute encephalopathy. Likely 2/2 COVID infection. No acute findings on head CT.  3. ESRD -  Usual TTS. Now MWF for Covid shift. Last HD 1/5. K+ 6.0 . Plan for dialysis today.  4. Hypertension/volume  - BP ok. Some volume on exam.  Attempt 3L UF with HD. Has been under OP dry weight. Will need lower EDW at discharge.  5.  Anemia  - Hgb >15.  No ESA needs.  6.  Metabolic bone disease -  Ca ok. Continue home binders/hectorol .   Lynnda Child PA-C [facility] 10/26/2020, 11:24 AM

## 2020-10-26 NOTE — Progress Notes (Signed)
Initial Nutrition Assessment  INTERVENTION:   -Nepro Shake po BID, each supplement provides 425 kcal and 19 grams protein  NUTRITION DIAGNOSIS:   Increased nutrient needs related to chronic illness (ESRD on HD) as evidenced by estimated needs.  GOAL:   Patient will meet greater than or equal to 90% of their needs  MONITOR:   PO intake,Supplement acceptance,Labs,Weight trends,I & O's  REASON FOR ASSESSMENT:   Malnutrition Screening Tool    ASSESSMENT:   60 y.o. 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 Admitted for Respiratory failure with hypoxia secondary to pneumonia due to Covid- 19  Patient last HD for ESRD was on 1/5. Per nephrology note, pt planned for HD today.  No PO documented since admission. Pt has been accepting Nepro supplements, will continue.  EDW: typically 86 kg (189 lbs).  Weight after last HD on 1/5, 83.7 kg (184 lbs).  Medications: Vitamin C, cinacalcet, Zinc sulfate  Labs reviewed: Elevated K (6.0)  NUTRITION - FOCUSED PHYSICAL EXAM:  Unable to complete  Diet Order:   Diet Order            Diet renal with fluid restriction Fluid restriction: 1200 mL Fluid; Room service appropriate? Yes; Fluid consistency: Thin  Diet effective now                 EDUCATION NEEDS:   No education needs have been identified at this time  Skin:  Skin Assessment: Reviewed RN Assessment  Last BM:  PTA  Height:   Ht Readings from Last 1 Encounters:  10/25/20 5\' 9"  (1.753 m)    Weight:   Wt Readings from Last 1 Encounters:  10/25/20 83.6 kg    BMI:  Body mass index is 27.22 kg/m.  Estimated Nutritional Needs:   Kcal:  2500-2700  Protein:  105-120g  Fluid:  1L + UOP  Clayton Bibles, MS, RD, LDN Inpatient Clinical Dietitian Contact information available via Amion

## 2020-10-26 NOTE — H&P (Signed)
History and Physical    [name] [name] FUX:[id number] DOB: 06-24-1961 DOA: 10/25/2020  Referring MD/NP/PA: Shela Leff, MD PCP: Patient, No Pcp Per  Patient coming from: Home brought in by his brother to [facility]  Chief Complaint: Confusion  I have personally briefly reviewed patient's old medical records in [place]   HPI: [name] [name] is a 60 y.o. 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  History is obtained from the patient as well as family over the phone.  He had been having symptoms including cough and congestion over the last week and a half.  Patient admits he had been walking around the house naked, taking 5 more showers a day, and has felt terrible.  7 people live in the household and he reportedly helps take care of all of them fixing meals and cleaning.  His behavior has been outside of the norm and his brother and sister reported that normally he is the smartest person that they know.  He denies any suicida/homicidal ideations or hallucinations. He had been in the Covid shift at the dialysis center since 1/3, but is not clear when he first tested positive.  Normally patient does not require oxygen at baseline. Received initial two vaccines and hadn't had the booster.  ED Course: Upon admission into the emergency department patient was seen to be afebrile, respirations 18-25, and O2 saturations intermittently in the 80s currently maintained at 96% on 2 L of nasal cannula oxygen.  No acute abnormalities.  Labs significant for WBC 3.2, hemoglobin 15.6, platelets 83, potassium 6, BUN 75, creatinine 17.81, anion gap 25, acetaminophen level <10, AST 222, ALT 218, and total bilirubin 1.6.  COVID-19 screening was positive.  This x-ray revealed patchy bilateral lung opacities concerning for Covid pneumonia.  Patient had been given remdesivir,  steroids, and albuterol inhaler.  Review of Systems  Constitutional: Positive for malaise/fatigue.  HENT: Negative for congestion.   Respiratory: Positive for cough and sputum production.   Cardiovascular: Negative for leg swelling.  Genitourinary: Negative for dysuria and hematuria.  Skin: Negative for [place].  Neurological: Negative for focal weakness and loss of consciousness.  Psychiatric/Behavioral: Negative for hallucinations and suicidal ideas.       Positive for confusion    Past Medical History:  Diagnosis Date  . Anemia   . Anxiety    situational  . DVT (deep venous thrombosis) (HCC)    leg  . ESRD (end stage renal disease) ([place])    [facility]  . GERD (gastroesophageal reflux disease)   . Hypertension   . Leucocytosis   . Polycystic kidney disease   . Shortness of breath     Past Surgical History:  Procedure Laterality Date  . AV FISTULA PLACEMENT Left 04/05/2014   Procedure: CREATION OF ARTERIOVENOUS (AV) FISTULA ;  Surgeon: Angelia Mould, MD;  Location: [facility];  Service: Vascular;  Laterality: Left;  . CENTRAL VENOUS CATHETER INSERTION  04/02/2014   DR Titus Mould  . COLONOSCOPY    . FISTULA SUPERFICIALIZATION Left 0/11/5425   Procedure: PLICATION OF ANEURYSM ARTERIOVENOUS FISTULA LEFT ARM;  Surgeon: Waynetta Sandy, MD;  Location: [facility];  Service: Vascular;  Laterality: Left;  . INSERTION OF DIALYSIS CATHETER Right 04/05/2014   Procedure: ULTRASOUND GUIDED INSERTION OF DIALYSIS CATHETER;  Surgeon: Angelia Mould, MD;  Location: [facility];  Service: Vascular;  Laterality: Right;  .  NOSE SURGERY    . REVISON OF ARTERIOVENOUS FISTULA Left 11/18/8655   Procedure: PLICATION OF LEFT ARM ARTERIOVENOUS FISTULA;  Surgeon: Waynetta Sandy, MD;  Location: [facility];  Service: Vascular;  Laterality: Left;     reports that he has quit smoking. He has never used smokeless tobacco. He reports that he does not drink alcohol and does not use  drugs.  Allergies  Allergen Reactions  . Fentanyl Nausea Only  . Vancomycin Other (See Comments)    Red face ? Red Man Syndrome ?    Family History  Problem Relation Age of Onset  . Polycystic kidney disease Father   . Hypertension Father   . Polycystic kidney disease Sister     Prior to Admission medications   Medication Sig Start Date End Date Taking? Authorizing Provider  aspirin EC 81 MG tablet Take 81 mg by mouth every evening.   Yes [provider]  cetirizine (ZYRTEC) 10 MG tablet Take 10 mg by mouth daily as needed for allergies.   Yes [provider]  cinacalcet (SENSIPAR) 30 MG tablet Take 30 mg by mouth every evening.    Yes [provider]  diphenhydramine-acetaminophen (TYLENOL PM) 25-500 MG TABS tablet Take 1 tablet by mouth at bedtime as needed.   Yes [provider]  ferric citrate (AURYXIA) 1 GM 210 MG(Fe) tablet Take 210-630 mg by mouth 3 (three) times daily with meals. Take 630 mg with each meal and 210 - 420 mg with each snack   Yes [provider]  fluticasone (FLONASE) 50 MCG/ACT nasal spray Place 2 sprays into both nostrils daily as needed for allergies. 12/15/17  Yes [provider]  multivitamin (RENA-VIT) TABS tablet Take 1 tablet by mouth every morning. Patient taking differently: Take 1 tablet by mouth every evening. 04/10/14  Yes Lyles, Charles, PA-C  pantoprazole (PROTONIX) 40 MG tablet Take 40 mg by mouth every evening.    Yes [provider]  acetaminophen (TYLENOL) 500 MG tablet Take 500-1,000 mg by mouth every 6 (six) hours as needed for moderate pain.    [provider]  bismuth subsalicylate (PEPTO BISMOL) 262 MG/15ML suspension Take 30 mLs by mouth every 6 (six) hours as needed for indigestion.    [provider]  hydrocortisone cream 1 % Apply 1 application topically daily as needed for itching.    [provider]    Physical Exam:  Constitutional:  Middle-age male who appears to be sick man in no acute distress at the Vitals:   10/25/20 2300 10/26/20 0200 10/26/20 0320 10/26/20 0434  BP: (!) 126/100 128/88 (!) 133/96 (!) 133/96  Pulse: 93 87 85 85  Resp: (!) 21 (!) 22 (!) 21 (!) 23  Temp:   (!) 97.4 F (36.3 C) 97.8 F (36.6 C)  TempSrc:   Oral Axillary  SpO2: 97% 94% 96% 96%  Weight:      Height:       Eyes: PERRL, lids and conjunctivae normal ENMT: Mucous membranes are moist. Posterior pharynx clear of any exudate or lesions.  Neck: normal, supple, no masses, no thyromegaly Respiratory: Tachypneic with decreased overall  Cardiovascular: Regular rate and rhythm, no murmurs / rubs / gallops. No extremity edema. 2+ pedal pulses. No carotid bruits.  Left forearm fistula appreciated Abdomen: Protuberant abdomen with left-sided ventral hernia that is reducible.  Bowel sounds present quadrants. Musculoskeletal: no clubbing / cyanosis. No joint deformity upper and lower extremities. Good [name], no contractures. Normal muscle tone.  Skin: no rashes, lesions, ulcers. No induration Neurologic: CN 2-12 grossly intact. Sensation intact, DTR normal. Strength 5/5 in all 4.  Psychiatric: Abnormal judgment and insight. Alert and oriented x 3.    Labs on Admission: I have personally reviewed following labs and imaging studies  CBC: Recent Labs  Lab 10/25/20 1956  WBC 5.0  NEUTROABS 3.5  HGB 16.4  HCT 48.4  MCV 95.1  PLT 557*   Basic Metabolic Panel: Recent Labs  Lab 10/25/20 1956  NA 137  K 5.2*  CL 87*  CO2 26  GLUCOSE 119*  BUN 72*  CREATININE 16.69*  CALCIUM 8.8*  MG 2.1   GFR: Estimated Creatinine Clearance: 4.9 mL/min (A) (by C-G formula based on SCr of 16.69 mg/dL (H)). Liver Function Tests: Recent Labs  Lab 10/25/20 1956  AST 276*  ALT 254*  ALKPHOS 74  BILITOT 1.4*  PROT 7.0  ALBUMIN 4.2   No results for input(s): LIPASE, AMYLASE in the last 168 hours. Recent Labs  Lab 10/25/20 2113  AMMONIA 19    Coagulation Profile: Recent Labs  Lab 10/25/20 2113  INR 1.3*   Cardiac Enzymes: No results for input(s): CKTOTAL, CKMB, CKMBINDEX, TROPONINI in the last 168 hours. BNP (last 3 results) No results for input(s): PROBNP in the last 8760 hours. HbA1C: No results for input(s): HGBA1C in the last 72 hours. CBG: No results for input(s): GLUCAP in the last 168 hours. Lipid Profile: No results for input(s): CHOL, HDL, LDLCALC, TRIG, CHOLHDL, LDLDIRECT in the last 72 hours. Thyroid Function Tests: No results for input(s): TSH, T4TOTAL, FREET4, T3FREE, THYROIDAB in the last 72 hours. Anemia Panel: No results for input(s): VITAMINB12, FOLATE, FERRITIN, TIBC, IRON, RETICCTPCT in the last 72 hours. Urine analysis:    Component Value Date/Time   COLORURINE YELLOW 09/14/2018 1228   APPEARANCEUR HAZY (A) 09/14/2018 1228   LABSPEC 1.010 09/14/2018 1228   PHURINE 8.0 09/14/2018 1228   GLUCOSEU NEGATIVE 09/14/2018 1228   HGBUR SMALL (A) 09/14/2018 1228   BILIRUBINUR NEGATIVE 09/14/2018 1228   KETONESUR NEGATIVE 09/14/2018 1228   PROTEINUR 30 (A) 09/14/2018 1228   UROBILINOGEN 0.2 04/02/2014 1650   NITRITE NEGATIVE 09/14/2018 1228   LEUKOCYTESUR LARGE (A) 09/14/2018 1228   Sepsis Labs: Recent Results (from the past 240 hour(s))  Resp Panel by RT-PCR (Flu A&B, Covid) Nasopharyngeal Swab     Status: Abnormal   Collection Time: 10/25/20  8:50 PM   Specimen: Nasopharyngeal Swab; Nasopharyngeal(NP) swabs in vial transport medium  Result Value Ref Range Status   SARS Coronavirus 2 by RT PCR POSITIVE (A) NEGATIVE Final    Comment: RESULT CALLED TO, READ BACK BY AND VERIFIED WITH: Jaci Carrel RN AT 2155 ON 10/25/20 BY I.[email or web address] (NOTE) SARS-CoV-2 target nucleic acids are DETECTED.  The SARS-CoV-2 RNA is generally detectable in upper respiratory specimens during the acute phase of infection. Positive results are indicative of the presence of the identified virus, but do not rule out  bacterial infection or co-infection with other pathogens not detected by the test. Clinical correlation with patient history and other diagnostic information is necessary to determine patient infection status. The expected result is Negative.  Fact Sheet for Patients: EntrepreneurPulse.com.au  Fact Sheet for Healthcare Providers: IncredibleEmployment.be  This test is not yet approved or cleared by the [place] FDA and  has been authorized for detection and/or diagnosis of SARS-CoV-2 by FDA under an Emergency Use Authorization (EUA).  This EUA will remain in effect (meaning th is test can be  used) for the duration of  the COVID-19 declaration under Section 564(b)(1) of the Act, 21 U.S.C. section 360bbb-3(b)(1), unless the authorization is terminated or revoked sooner.     Influenza A by PCR NEGATIVE NEGATIVE Final   Influenza B by PCR NEGATIVE NEGATIVE Final    Comment: (NOTE) The Xpert Xpress SARS-CoV-2/FLU/RSV plus assay is intended as an aid in the diagnosis of influenza from Nasopharyngeal swab specimens and should not be used as a sole basis for treatment. Nasal washings and aspirates are unacceptable for Xpert Xpress SARS-CoV-2/FLU/RSV testing.  Fact Sheet for Patients: EntrepreneurPulse.com.au  Fact Sheet for Healthcare Providers: IncredibleEmployment.be  This test is not yet approved or cleared by the [place] FDA and has been authorized for detection and/or diagnosis of SARS-CoV-2 by FDA under an Emergency Use Authorization (EUA). This EUA will remain in effect (meaning this test can be used) for the duration of the COVID-19 declaration under Section 564(b)(1) of the Act, 21 U.S.C. section 360bbb-3(b)(1), unless the authorization is terminated or revoked.  Performed at [facility], [address] [place], [place] [address]      Radiological Exams on Admission: CT Head  Wo Contrast  Result Date: 10/25/2020 CLINICAL DATA:  60 year old male with altered mental status. EXAM: CT HEAD WITHOUT CONTRAST TECHNIQUE: Contiguous axial images were obtained from the base of the skull through the vertex without intravenous contrast. COMPARISON:  None. FINDINGS: Evaluation of this exam is limited due to motion artifact. Brain: The ventricles and sulci appropriate size for patient's age. The gray-white matter discrimination is preserved. There is no acute intracranial hemorrhage. No mass effect or midline shift. No extra-axial fluid collection. Vascular: No hyperdense vessel or unexpected calcification. Skull: Normal. Negative for fracture or focal lesion. Sinuses/Orbits: No acute finding. Other: None IMPRESSION: No acute intracranial pathology. Electronically Signed   By: Anner Crete M.D.   On: 10/25/2020 20:23   DG Chest Port 1 View  Result Date: 10/25/2020 CLINICAL DATA:  Altered mental status.  COVID positive. EXAM: PORTABLE CHEST 1 VIEW COMPARISON:  09/14/2018 FINDINGS: Symmetric low lung volumes. Patchy and heterogeneous bilateral lung opacities. Upper normal heart size with unchanged mediastinal contours. There may be a vascular congestion. No significant pleural effusion. No pneumothorax. No acute osseous abnormalities are seen. IMPRESSION: 1. Patchy and heterogeneous bilateral lung opacities, pattern typical of COVID-19 pneumonia. 2. Possible vascular congestion. Electronically Signed   By: Keith Rake M.D.   On: 10/25/2020 20:15    EKG: Independently reviewed.  Sinus rhythm at 96 bpm  Assessment/Plan Respiratory failure with hypoxia secondary to pneumonia due to Covid- 19: Acute.  Patient presents with reports of coughing patient.  Diagnosed with COVID-19 either on 1/3 or before as he had been in Covid unit with dialysis.  Found to be intermittently hypoxic down to 82% while resting placed on 2 L nasal cannula oxygen.  Chest x-ray noted bilateral patchy infiltrates  consistent with Covid pneumonia. -Admit to a telemetry bed -Continuous pulse oximetry with nasal cannula oxygen maintain O2 saturation greater than 90% -Check blood cultures -Remdesivir per primary -Albuterol inhaler -Solu-Medrol IV -Empiric antibiotics of Rocephin IV due to elevated procalcitonin -Vitamin C and zinc -Antitussives as needed -Continue to monitor inflammatory markers daily  Acute metabolic encephalopathy: Family reports that patient is normally the smartest person they know and usually cares for everyone in the household.  CT scan of the brain without any acute abnormalities.  Question of symptoms related with uremia. -Neuro checks -Check TSH -Check UDS  if able -May warrant further work-up if symptoms do not resolve with hemodialysis and treatment for Covid pneumonia  ESRD on HD secondary to polycystic kidney diseas: Patient normally dialyzes Monday, Wednesday, and Friday.  Patient last reported having hemodialysis on 1/5, but missed dialysis yesterday.  Labs significant for potassium 6, BUN 75, creatinine 17.81. -Continue current medication -Appreciate nephrology consultative services, HD per their recommendation  Metabolic acidosis with elevated anion gap: On admission anion gap 25 with CO2 24. -Continue to monitor  Thrombocytopenia: Acute on chronic.  Platelet count noted to be 108->84 on admission.  No reports of bleeding. -Continue to monitor  Transaminitis and hyperbilirubinemia: Acute.  On admission AST 222, ALT 218, and total bilirubin 1.6.  Acetaminophen level was within normal limits. -Check hepatitis panel -Continue to monitor levels daily and adjust medications as needed  GERD: Medications include Protonix 40 mg nightly. -Substituted Pepcid  DVT prophylaxis: heparin   Code Status: Full Family Communication: Brother and sister updated over the phone Disposition Plan: Hopefully discharge home once medically Consults called: Nephro Admission status:  Inpatient, requiring more than 2 midnight stay  Norval Morton MD [facility]   If 7PM-7AM, please contact night-coverage   10/26/2020, 7:07 AM

## 2020-10-27 ENCOUNTER — Inpatient Hospital Stay (HOSPITAL_COMMUNITY): Payer: Medicare (Managed Care)

## 2020-10-27 LAB — COMPREHENSIVE METABOLIC PANEL
ALT: 189 U/L — ABNORMAL HIGH (ref 0–44)
AST: 165 U/L — ABNORMAL HIGH (ref 15–41)
Albumin: 3.2 g/dL — ABNORMAL LOW (ref 3.5–5.0)
Alkaline Phosphatase: 74 U/L (ref 38–126)
Anion gap: 21 — ABNORMAL HIGH (ref 5–15)
BUN: 72 mg/dL — ABNORMAL HIGH (ref 6–20)
CO2: 26 mmol/L (ref 22–32)
Calcium: 7.9 mg/dL — ABNORMAL LOW (ref 8.9–10.3)
Chloride: 92 mmol/L — ABNORMAL LOW (ref 98–111)
Creatinine, Ser: 14.1 mg/dL — ABNORMAL HIGH (ref 0.61–1.24)
GFR, Estimated: 4 mL/min — ABNORMAL LOW (ref >60)
Glucose, Bld: 168 mg/dL — ABNORMAL HIGH (ref 70–99)
Potassium: 5.7 mmol/L — ABNORMAL HIGH (ref 3.5–5.1)
Sodium: 139 mmol/L (ref 135–145)
Total Bilirubin: 1.2 mg/dL (ref 0.3–1.2)
Total Protein: 5.9 g/dL — ABNORMAL LOW (ref 6.5–8.1)

## 2020-10-27 LAB — CBC WITH DIFFERENTIAL/PLATELET
Abs Immature Granulocytes: 0.03 10*3/uL (ref 0.00–0.07)
Basophils Absolute: 0 10*3/uL (ref 0.0–0.1)
Basophils Relative: 0 %
Eosinophils Absolute: 0 10*3/uL (ref 0.0–0.5)
Eosinophils Relative: 0 %
HCT: 46.8 % (ref 39.0–52.0)
Hemoglobin: 16.5 g/dL (ref 13.0–17.0)
Immature Granulocytes: 1 %
Lymphocytes Relative: 9 %
Lymphs Abs: 0.6 10*3/uL — ABNORMAL LOW (ref 0.7–4.0)
MCH: 32.4 pg (ref 26.0–34.0)
MCHC: 35.3 g/dL (ref 30.0–36.0)
MCV: 91.9 fL (ref 80.0–100.0)
Monocytes Absolute: 0.3 10*3/uL (ref 0.1–1.0)
Monocytes Relative: 5 %
Neutro Abs: 5.4 10*3/uL (ref 1.7–7.7)
Neutrophils Relative %: 85 %
Platelets: 87 10*3/uL — ABNORMAL LOW (ref 150–400)
RBC: 5.09 MIL/uL (ref 4.22–5.81)
RDW: 14.1 % (ref 11.5–15.5)
WBC: 6.3 10*3/uL (ref 4.0–10.5)
nRBC: 0 % (ref 0.0–0.2)

## 2020-10-27 LAB — FERRITIN: Ferritin: 4238 ng/mL — ABNORMAL HIGH (ref 24–336)

## 2020-10-27 LAB — HEPATITIS PANEL, ACUTE
HCV Ab: NONREACTIVE
Hep A IgM: NONREACTIVE
Hep B C IgM: NONREACTIVE
Hepatitis B Surface Ag: NONREACTIVE

## 2020-10-27 LAB — D-DIMER, QUANTITATIVE: D-Dimer, Quant: 2.01 ug/mL-FEU — ABNORMAL HIGH (ref 0.00–0.50)

## 2020-10-27 LAB — C-REACTIVE PROTEIN: CRP: 3.4 mg/dL — ABNORMAL HIGH (ref <1.0)

## 2020-10-27 LAB — BRAIN NATRIURETIC PEPTIDE: B Natriuretic Peptide: >4500 pg/mL — ABNORMAL HIGH (ref 0.0–100.0)

## 2020-10-27 LAB — PHOSPHORUS: Phosphorus: 8.7 mg/dL — ABNORMAL HIGH (ref 2.5–4.6)

## 2020-10-27 LAB — MAGNESIUM: Magnesium: 2.2 mg/dL (ref 1.7–2.4)

## 2020-10-27 MED ORDER — SODIUM ZIRCONIUM CYCLOSILICATE 10 G PO PACK
10.0000 g | PACK | Freq: Four times a day (QID) | ORAL | Status: AC
  Administered 2020-10-27 (×2): 10 g via ORAL
  Filled 2020-10-27 (×2): qty 1

## 2020-10-27 MED ORDER — METHYLPREDNISOLONE SODIUM SUCC 40 MG IJ SOLR: 40.0000 mg | Freq: Two times a day (BID) | INTRAMUSCULAR | Status: DC

## 2020-10-27 MED ORDER — FERRIC CITRATE 1 GM 210 MG(FE) PO TABS
630.0000 mg | ORAL_TABLET | Freq: Three times a day (TID) | ORAL | Status: DC
  Administered 2020-10-27 – 2020-10-29 (×5): 630 mg via ORAL
  Filled 2020-10-27 (×5): qty 3

## 2020-10-27 MED ORDER — METHYLPREDNISOLONE SODIUM SUCC 40 MG IJ SOLR
40.0000 mg | Freq: Two times a day (BID) | INTRAMUSCULAR | Status: DC
  Administered 2020-10-27: 40 mg via INTRAVENOUS
  Filled 2020-10-27: qty 1

## 2020-10-27 MED ORDER — FERRIC CITRATE 1 GM 210 MG(FE) PO TABS: 420.0000 mg | ORAL_TABLET | Freq: Three times a day (TID) | ORAL | Status: DC

## 2020-10-27 MED ORDER — DOXERCALCIFEROL 4 MCG/2ML IV SOLN
3.0000 ug | INTRAVENOUS | Status: DC
  Administered 2020-10-28: 3 ug via INTRAVENOUS

## 2020-10-27 NOTE — Progress Notes (Addendum)
[facility] Progress Note   Subjective:  Seen in room. Feels much better today. Off O2. Dialyzed yesterday with 3L UF.   Objective Vitals:   10/27/20 0350 10/27/20 0507 10/27/20 0508 10/27/20 0522  BP:   100/75 114/90  Pulse: 69  78 73  Resp: 17  13 19   Temp:   97.8 F (36.6 C) 97.8 F (36.6 C)  TempSrc:  Axillary Axillary Axillary  SpO2: 95%  92% 94%  Weight:      Height:         Additional Objective Labs: Basic Metabolic Panel: Recent Labs  Lab 10/25/20 1956 10/26/20 0341 10/27/20 0356  NA 137 138 139  K 5.2* 6.0* 5.7*  CL 87* 89* 92*  CO2 26 24 26   GLUCOSE 119* 137* 168*  BUN 72* 75* 72*  CREATININE 16.69* 17.81* 14.10*  CALCIUM 8.8* 8.7* 7.9*  PHOS  --   --  8.7*   CBC: Recent Labs  Lab 10/25/20 1956 10/26/20 0341 10/27/20 0356  WBC 5.0 3.2* 6.3  NEUTROABS 3.5  --  5.4  HGB 16.4 15.6 16.5  HCT 48.4 44.5 46.8  MCV 95.1 92.7 91.9  PLT 108* 83* 87*   Blood Culture    Component Value Date/Time   SDES BLOOD RIGHT ANTECUBITAL 09/14/2018 1233   SDES BLOOD RIGHT HAND 09/14/2018 1233   SPECREQUEST  09/14/2018 1233    BOTTLES DRAWN AEROBIC AND ANAEROBIC Blood Culture adequate volume   SPECREQUEST  09/14/2018 1233    BOTTLES DRAWN AEROBIC AND ANAEROBIC Blood Culture adequate volume   CULT  09/14/2018 1233    NO GROWTH 5 DAYS Performed at [facility], [place] [address], [place], [place] [address]    CULT  09/14/2018 1233    NO GROWTH 5 DAYS Performed at [facility], [place] [address] [place], [place] [address]    REPTSTATUS 09/19/2018 FINAL 09/14/2018 1233   REPTSTATUS 09/19/2018 FINAL 09/14/2018 1233     Physical Exam General: WNWD, nad, sitting in recliner on RA  Heart: RRR Lungs: Clear bilaterally  Abdomen: soft, non-tender  Extremities: Trace LE edema  Dialysis Access: LUE AVF +bruit   Medications: . cefTRIAXone (ROCEPHIN)  IV Stopped (10/26/20 2321)  . remdesivir 100 mg in NS 100 mL 100 mg (10/27/20 0955)   .  vitamin C  500 mg Oral Daily  . Chlorhexidine Gluconate Cloth  6 each Topical Q0600  . cinacalcet  30 mg Oral QPM  . famotidine  10 mg Oral Daily  . feeding supplement (NEPRO CARB STEADY)  237 mL Oral BID BM  . ferric citrate  210-630 mg Oral TID WC  . heparin  5,000 Units Subcutaneous Q8H  . mouth rinse  15 mL Mouth Rinse BID  . methylPREDNISolone (SOLU-MEDROL) injection  40 mg Intravenous Q12H  . sodium chloride flush  3 mL Intravenous Q12H  . sodium zirconium cyclosilicate  10 g Oral G2R  . zinc sulfate  220 mg Oral Daily    NW TTS 4h 400/800 2K/2Ca EDW 86 kg (leaving below EDW post wt 83.7 on 1/5)  AVF Hep 5600 Hectorol 3 TIW Auryixa 3 qac, Sensipar 30 qd   Assessment/Plan: 1. COVID-19 PNA. Remdesivir, steroids per primary team.  2. Acute encephalopathy. Likely 2/2 COVID infection. No acute findings on head CT. Improving.  3. ESRD -  Usual TTS. Now MWF for Covid shift. Had urgent HD 1/8.  Next HD Mon 1/10.  4. Hypertension/volume  - BP ok. Some volume on  exam. Attempt another  3L UF with HD. Has been under OP dry weight. Will need lower EDW at discharge.  5.  Anemia  - Hgb >15. No ESA needs.  6.  Metabolic bone disease -  Ca ok. Continue home binders/hectorol .    Lynnda Child PA-C [facility] 10/27/2020,11:08 AM

## 2020-10-27 NOTE — Progress Notes (Signed)
PROGRESS NOTE                                                                                                                                                                                                             Patient Demographics:    [name] [name], is a 60 y.o. male, DOB - [birth year], IOX:[id number]  Outpatient Primary MD for the patient is Patient, No Pcp Per    LOS - 1  Admit date - 10/25/2020    CC - confused     Brief Narrative (HPI from H&P) - [name] [name] is a 60 y.o. 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  History is obtained from the patient as well as family over the phone.  He had been having symptoms including cough and congestion over the last week and a half, he had missed 1 run of dialysis, was getting quite confused and family got concerned and brought him to the ER where he was diagnosed with COVID-19 pneumonia, metabolic encephalopathy admit to the hospital.   Subjective:    [name] [name] today has, No headache, No chest pain, No abdominal pain - No Nausea, No new weakness tingling or numbness, no SOB.   Assessment  & Plan :     1. Acute Covid 19 Viral Pneumonitis during the ongoing 2020 Covid 19 Pandemic - his immunization status is unknown but he states that he does not recall getting any vaccine, he is in for GERD mild to moderate parenchymal lung injury, has been started on steroids and Remdesivir.  Minimal to no hypoxia, monitor closely  Encouraged the patient to sit up in chair in the daytime use I-S and flutter valve for pulmonary toiletry and then prone in bed when at night.  Will advance activity and titrate down oxygen as possible.     SpO2: 94 % O2 Flow Rate (L/min): 1 L/min  Recent Labs  Lab 10/25/20 1956 10/25/20 2050 10/25/20 2113  10/26/20 0341 10/27/20 0356  WBC 5.0  --   --  3.2* 6.3  HGB 16.4  --   --  15.6 16.5  HCT 48.4  --   --  44.5 46.8  PLT 108*  --   --  83* 87*  CRP  --   --   --  3.7* 3.4*  BNP  --   --   --  >4,500.0* >4,500.0*  DDIMER  --   --   --  2.12* 2.01*  PROCALCITON  --   --   --  14.13  --   AST 276*  --   --  222* 165*  ALT 254*  --   --  218* 189*  ALKPHOS 74  --   --  71 74  BILITOT 1.4*  --   --  1.6* 1.2  ALBUMIN 4.2  --   --  3.4* 3.2*  INR  --   --  1.3*  --   --   SARSCOV2NAA  --  POSITIVE*  --   --   --     2.  Acute metabolic encephalopathy.  Dialyze, treat COVID-19 and monitor.  No focal deficits with stable head CT.  Mentation is improving.   3.  Mild asymptomatic transaminitis.  Likely due to viral infection and Remdesivir.  Monitor trend and INR.  4.  ESRD.  TTS schedule.  Renal on board.  5.  GERD.  On Pepcid.  6.  Mildly elevated D-dimer.  On heparin prophylactic dose, check leg venous ultrasound.    7. Mild viral infection induced acute on chronic thrombocytopenia (150s) .  Monitor.   Lab Results  Component Value Date   PLT 87 (L) 10/27/2020        Condition - Extremely Guarded  Family Communication  :  None  Code Status :  Full  Consults  :  Renal  Procedures  :   Leg Venous Ultrasound.    CT head.  Nonacute  PUD Prophylaxis : None  Disposition Plan  :    Status is: Inpatient  Remains inpatient appropriate because:IV treatments appropriate due to intensity of illness or inability to take PO   Dispo: The patient is from: Home              Anticipated d/c is to: Home              Anticipated d/c date is: 3 days              Patient currently is not medically stable to d/c.  DVT Prophylaxis  :   Heparin    Lab Results  Component Value Date   PLT 87 (L) 10/27/2020    Diet :  Diet Order            Diet renal with fluid restriction Fluid restriction: 1200 mL Fluid; Room service appropriate? Yes; Fluid consistency: Thin  Diet  effective now                  Inpatient Medications  Scheduled Meds: . vitamin C  500 mg Oral Daily  . Chlorhexidine Gluconate Cloth  6 each Topical Q0600  . cinacalcet  30 mg Oral QPM  . famotidine  10 mg Oral Daily  . feeding supplement (NEPRO CARB STEADY)  237 mL Oral BID BM  . ferric citrate  210-630 mg Oral TID WC  . heparin  5,000 Units Subcutaneous Q8H  . mouth rinse  15 mL Mouth Rinse BID  . methylPREDNISolone (SOLU-MEDROL) injection  0.5 mg/kg Intravenous Q12H   Followed by  . [START ON 10/29/2020] predniSONE  50 mg Oral Daily  . sodium chloride flush  3 mL Intravenous Q12H  . sodium zirconium cyclosilicate  10 g Oral  Q6H  . zinc sulfate  220 mg Oral Daily   Continuous Infusions: . cefTRIAXone (ROCEPHIN)  IV Stopped (10/26/20 2321)  . remdesivir 100 mg in NS 100 mL Stopped (10/26/20 1933)   PRN Meds:.diphenhydrAMINE **AND** acetaminophen, acetaminophen, albuterol, chlorpheniramine-HYDROcodone, fluticasone, guaiFENesin-dextromethorphan, hydrocortisone cream  Antibiotics  :    Anti-infectives (From admission, onward)   Start     Dose/Rate Route Frequency Ordered Stop   10/26/20 1530  cefTRIAXone (ROCEPHIN) 2 g in sodium chloride 0.9 % 100 mL IVPB        2 g 200 mL/hr over 30 Minutes Intravenous Every 24 hours 10/26/20 1442 10/31/20 1529   10/26/20 1000  remdesivir 100 mg in sodium chloride 0.9 % 100 mL IVPB        100 mg 200 mL/hr over 30 Minutes Intravenous Daily 10/25/20 2232 10/30/20 0959   10/25/20 2300  remdesivir 100 mg in sodium chloride 0.9 % 100 mL IVPB        100 mg 200 mL/hr over 30 Minutes Intravenous Every 30 min 10/25/20 2232 10/26/20 0006       Time Spent in minutes  30   Lala Lund M.D on 10/27/2020 at 9:43 AM  To page go to [email or web address]   [facility] -  Office  [phone]    See all Orders from today for further details    Objective:   Vitals:   10/27/20 0350 10/27/20 0507 10/27/20 0508 10/27/20 0522  BP:   100/75  114/90  Pulse: 69  78 73  Resp: 17  13 19   Temp:   97.8 F (36.6 C) 97.8 F (36.6 C)  TempSrc:  Axillary Axillary Axillary  SpO2: 95%  92% 94%  Weight:      Height:        Wt Readings from Last 3 Encounters:  11/27/19 92.5 kg  09/18/19 91.6 kg  09/04/19 90.7 kg     Intake/Output Summary (Last 24 hours) at 10/27/2020 0943 Last data filed at 10/26/2020 2321 Gross per 24 hour  Intake 1057 ml  Output 3000 ml  Net -1943 ml     Physical Exam  Awake, minimally confused, No new F.N deficits, Normal affect [place].AT,PERRAL Supple Neck,No JVD, No cervical lymphadenopathy appriciated.  Symmetrical Chest wall movement, Good air movement bilaterally, CTAB RRR,No Gallops,Rubs or new Murmurs, No Parasternal Heave +ve B.Sounds, Abd Soft, No tenderness, No organomegaly appriciated, No rebound - guarding or rigidity. No Cyanosis, Clubbing or edema, No new Ullery or bruise       Data Review:    CBC Recent Labs  Lab 10/25/20 1956 10/26/20 0341 10/27/20 0356  WBC 5.0 3.2* 6.3  HGB 16.4 15.6 16.5  HCT 48.4 44.5 46.8  PLT 108* 83* 87*  MCV 95.1 92.7 91.9  MCH 32.2 32.5 32.4  MCHC 33.9 35.1 35.3  RDW 14.2 14.1 14.1  LYMPHSABS 0.9  --  0.6*  MONOABS 0.5  --  0.3  EOSABS 0.0  --  0.0  BASOSABS 0.0  --  0.0    Recent Labs  Lab 10/25/20 1956 10/25/20 2113 10/26/20 0341 10/26/20 1527 10/27/20 0356  NA 137  --  138  --  139  K 5.2*  --  6.0*  --  5.7*  CL 87*  --  89*  --  92*  CO2 26  --  24  --  26  GLUCOSE 119*  --  137*  --  168*  BUN 72*  --  75*  --  72*  CREATININE  16.69*  --  17.81*  --  14.10*  CALCIUM 8.8*  --  8.7*  --  7.9*  AST 276*  --  222*  --  165*  ALT 254*  --  218*  --  189*  ALKPHOS 74  --  71  --  74  BILITOT 1.4*  --  1.6*  --  1.2  ALBUMIN 4.2  --  3.4*  --  3.2*  MG 2.1  --   --   --  2.2  CRP  --   --  3.7*  --  3.4*  DDIMER  --   --  2.12*  --  2.01*  PROCALCITON  --   --  14.13  --   --   INR  --  1.3*  --   --   --   TSH  --   --   --   0.276*  --   AMMONIA  --  19  --   --   --   BNP  --   --  >4,500.0*  --  >4,500.0*    ------------------------------------------------------------------------------------------------------------------ No results for input(s): CHOL, HDL, LDLCALC, TRIG, CHOLHDL, LDLDIRECT in the last 72 hours.  Lab Results  Component Value Date   HGBA1C 5.5 04/09/2014   ------------------------------------------------------------------------------------------------------------------ Recent Labs    10/26/20 1527  TSH 0.276*    Cardiac Enzymes No results for input(s): CKMB, TROPONINI, MYOGLOBIN in the last 168 hours.  Invalid input(s): CK ------------------------------------------------------------------------------------------------------------------    Component Value Date/Time   BNP >4,500.0 (H) 10/27/2020 0356    Micro Results Recent Results (from the past 240 hour(s))  Resp Panel by RT-PCR (Flu A&B, Covid) Nasopharyngeal Swab     Status: Abnormal   Collection Time: 10/25/20  8:50 PM   Specimen: Nasopharyngeal Swab; Nasopharyngeal(NP) swabs in vial transport medium  Result Value Ref Range Status   SARS Coronavirus 2 by RT PCR POSITIVE (A) NEGATIVE Final    Comment: RESULT CALLED TO, READ BACK BY AND VERIFIED WITH: Jaci Carrel RN AT 2155 ON 10/25/20 BY I.SUGUT (NOTE) SARS-CoV-2 target nucleic acids are DETECTED.  The SARS-CoV-2 RNA is generally detectable in upper respiratory specimens during the acute phase of infection. Positive results are indicative of the presence of the identified virus, but do not rule out bacterial infection or co-infection with other pathogens not detected by the test. Clinical correlation with patient history and other diagnostic information is necessary to determine patient infection status. The expected result is Negative.  Fact Sheet for Patients: EntrepreneurPulse.com.au  Fact Sheet for Healthcare  Providers: IncredibleEmployment.be  This test is not yet approved or cleared by the [place] FDA and  has been authorized for detection and/or diagnosis of SARS-CoV-2 by FDA under an Emergency Use Authorization (EUA).  This EUA will remain in effect (meaning th is test can be used) for the duration of  the COVID-19 declaration under Section 564(b)(1) of the Act, 21 U.S.C. section 360bbb-3(b)(1), unless the authorization is terminated or revoked sooner.     Influenza A by PCR NEGATIVE NEGATIVE Final   Influenza B by PCR NEGATIVE NEGATIVE Final    Comment: (NOTE) The Xpert Xpress SARS-CoV-2/FLU/RSV plus assay is intended as an aid in the diagnosis of influenza from Nasopharyngeal swab specimens and should not be used as a sole basis for treatment. Nasal washings and aspirates are unacceptable for Xpert Xpress SARS-CoV-2/FLU/RSV testing.  Fact Sheet for Patients: EntrepreneurPulse.com.au  Fact Sheet for Healthcare Providers: IncredibleEmployment.be  This test is not yet  approved or cleared by the [place] and has been authorized for detection and/or diagnosis of SARS-CoV-2 by FDA under an Emergency Use Authorization (EUA). This EUA will remain in effect (meaning this test can be used) for the duration of the COVID-19 declaration under Section 564(b)(1) of the Act, 21 U.S.C. section 360bbb-3(b)(1), unless the authorization is terminated or revoked.  Performed at [facility], [address] [place], [place] [address]   MRSA PCR Screening     Status: None   Collection Time: 10/26/20  6:50 AM   Specimen: Nasal Mucosa; Nasopharyngeal  Result Value Ref Range Status   MRSA by PCR NEGATIVE NEGATIVE Final    Comment:        The GeneXpert MRSA Assay (FDA approved for NASAL specimens only), is one component of a comprehensive MRSA colonization surveillance program. It is not intended to diagnose  MRSA infection nor to guide or monitor treatment for MRSA infections. Performed at [facility], [place] [address] [place], [place] [address]     Radiology Reports CT Head Wo Contrast  Result Date: 10/25/2020 CLINICAL DATA:  60 year old male with altered mental status. EXAM: CT HEAD WITHOUT CONTRAST TECHNIQUE: Contiguous axial images were obtained from the base of the skull through the vertex without intravenous contrast. COMPARISON:  None. FINDINGS: Evaluation of this exam is limited due to motion artifact. Brain: The ventricles and sulci appropriate size for patient's age. The gray-white matter discrimination is preserved. There is no acute intracranial hemorrhage. No mass effect or midline shift. No extra-axial fluid collection. Vascular: No hyperdense vessel or unexpected calcification. Skull: Normal. Negative for fracture or focal lesion. Sinuses/Orbits: No acute finding. Other: None IMPRESSION: No acute intracranial pathology. Electronically Signed   By: Anner Crete M.D.   On: 10/25/2020 20:23   DG Chest Port 1 View  Result Date: 10/27/2020 CLINICAL DATA:  Cough, shortness of breath, COVID positive. EXAM: PORTABLE CHEST 1 VIEW COMPARISON:  Chest x-ray dated 10/25/2020. FINDINGS: Stable cardiomegaly. Patchy bilateral airspace opacities are stable, compatible with multifocal pneumonia superimposed on chronic interstitial lung disease/emphysema. No pleural effusion or pneumothorax is seen. Osseous structures about the chest are unremarkable. IMPRESSION: 1. Stable multifocal pneumonia superimposed on chronic interstitial lung disease/emphysema. 2. Stable cardiomegaly. Electronically Signed   By: Franki Cabot M.D.   On: 10/27/2020 08:38   DG Chest Port 1 View  Result Date: 10/25/2020 CLINICAL DATA:  Altered mental status.  COVID positive. EXAM: PORTABLE CHEST 1 VIEW COMPARISON:  09/14/2018 FINDINGS: Symmetric low lung volumes. Patchy and heterogeneous bilateral lung opacities. Upper normal  heart size with unchanged mediastinal contours. There may be a vascular congestion. No significant pleural effusion. No pneumothorax. No acute osseous abnormalities are seen. IMPRESSION: 1. Patchy and heterogeneous bilateral lung opacities, pattern typical of COVID-19 pneumonia. 2. Possible vascular congestion. Electronically Signed   By: Keith Rake M.D.   On: 10/25/2020 20:15

## 2020-10-28 ENCOUNTER — Encounter (HOSPITAL_COMMUNITY): Payer: Medicare (Managed Care)

## 2020-10-28 LAB — COMPREHENSIVE METABOLIC PANEL
ALT: 214 U/L — ABNORMAL HIGH (ref 0–44)
AST: 194 U/L — ABNORMAL HIGH (ref 15–41)
Albumin: 3.2 g/dL — ABNORMAL LOW (ref 3.5–5.0)
Alkaline Phosphatase: 84 U/L (ref 38–126)
Anion gap: 22 — ABNORMAL HIGH (ref 5–15)
BUN: 110 mg/dL — ABNORMAL HIGH (ref 6–20)
CO2: 24 mmol/L (ref 22–32)
Calcium: 7.8 mg/dL — ABNORMAL LOW (ref 8.9–10.3)
Chloride: 91 mmol/L — ABNORMAL LOW (ref 98–111)
Creatinine, Ser: 15.81 mg/dL — ABNORMAL HIGH (ref 0.61–1.24)
GFR, Estimated: 3 mL/min — ABNORMAL LOW (ref >60)
Glucose, Bld: 169 mg/dL — ABNORMAL HIGH (ref 70–99)
Potassium: 5.4 mmol/L — ABNORMAL HIGH (ref 3.5–5.1)
Sodium: 137 mmol/L (ref 135–145)
Total Bilirubin: 0.8 mg/dL (ref 0.3–1.2)
Total Protein: 5.8 g/dL — ABNORMAL LOW (ref 6.5–8.1)

## 2020-10-28 LAB — D-DIMER, QUANTITATIVE: D-Dimer, Quant: 2.13 ug/mL-FEU — ABNORMAL HIGH (ref 0.00–0.50)

## 2020-10-28 LAB — CBC WITH DIFFERENTIAL/PLATELET
Abs Immature Granulocytes: 0.03 10*3/uL (ref 0.00–0.07)
Basophils Absolute: 0 10*3/uL (ref 0.0–0.1)
Basophils Relative: 0 %
Eosinophils Absolute: 0 10*3/uL (ref 0.0–0.5)
Eosinophils Relative: 0 %
HCT: 47.6 % (ref 39.0–52.0)
Hemoglobin: 16 g/dL (ref 13.0–17.0)
Immature Granulocytes: 0 %
Lymphocytes Relative: 6 %
Lymphs Abs: 0.5 10*3/uL — ABNORMAL LOW (ref 0.7–4.0)
MCH: 31.3 pg (ref 26.0–34.0)
MCHC: 33.6 g/dL (ref 30.0–36.0)
MCV: 93 fL (ref 80.0–100.0)
Monocytes Absolute: 0.3 10*3/uL (ref 0.1–1.0)
Monocytes Relative: 4 %
Neutro Abs: 6.5 10*3/uL (ref 1.7–7.7)
Neutrophils Relative %: 90 %
Platelets: 83 10*3/uL — ABNORMAL LOW (ref 150–400)
RBC: 5.12 MIL/uL (ref 4.22–5.81)
RDW: 14 % (ref 11.5–15.5)
WBC: 7.3 10*3/uL (ref 4.0–10.5)
nRBC: 0.3 % — ABNORMAL HIGH (ref 0.0–0.2)

## 2020-10-28 LAB — GLUCOSE, CAPILLARY
Glucose-Capillary: 135 mg/dL — ABNORMAL HIGH (ref 70–99)
Glucose-Capillary: 182 mg/dL — ABNORMAL HIGH (ref 70–99)
Glucose-Capillary: 183 mg/dL — ABNORMAL HIGH (ref 70–99)

## 2020-10-28 LAB — PHOSPHORUS: Phosphorus: 10.2 mg/dL — ABNORMAL HIGH (ref 2.5–4.6)

## 2020-10-28 LAB — BRAIN NATRIURETIC PEPTIDE: B Natriuretic Peptide: >4500 pg/mL — ABNORMAL HIGH (ref 0.0–100.0)

## 2020-10-28 LAB — MAGNESIUM: Magnesium: 2.6 mg/dL — ABNORMAL HIGH (ref 1.7–2.4)

## 2020-10-28 LAB — PROTIME-INR
INR: 1.4 — ABNORMAL HIGH (ref 0.8–1.2)
Prothrombin Time: 16.5 seconds — ABNORMAL HIGH (ref 11.4–15.2)

## 2020-10-28 LAB — FERRITIN: Ferritin: 4061 ng/mL — ABNORMAL HIGH (ref 24–336)

## 2020-10-28 LAB — C-REACTIVE PROTEIN: CRP: 3.2 mg/dL — ABNORMAL HIGH (ref <1.0)

## 2020-10-28 MED ORDER — SODIUM CHLORIDE 0.9 % IV SOLN: 100.0000 mL | INTRAVENOUS | Status: DC | PRN

## 2020-10-28 MED ORDER — LIDOCAINE-PRILOCAINE 2.5-2.5 % EX CREA: 1.0000 "application " | TOPICAL_CREAM | CUTANEOUS | Status: DC | PRN

## 2020-10-28 MED ORDER — METHYLPREDNISOLONE SODIUM SUCC 40 MG IJ SOLR
40.0000 mg | Freq: Every day | INTRAMUSCULAR | Status: DC
  Administered 2020-10-28 – 2020-10-29 (×2): 40 mg via INTRAVENOUS
  Filled 2020-10-28 (×2): qty 1

## 2020-10-28 MED ORDER — DOXERCALCIFEROL 4 MCG/2ML IV SOLN
INTRAVENOUS | Status: AC
  Filled 2020-10-28: qty 2

## 2020-10-28 MED ORDER — ALTEPLASE 2 MG IJ SOLR: 2.0000 mg | Freq: Once | INTRAMUSCULAR | Status: DC | PRN

## 2020-10-28 MED ORDER — PENTAFLUOROPROP-TETRAFLUOROETH EX AERO: 1.0000 "application " | INHALATION_SPRAY | CUTANEOUS | Status: DC | PRN

## 2020-10-28 MED ORDER — LIDOCAINE HCL (PF) 1 % IJ SOLN: 5.0000 mL | INTRAMUSCULAR | Status: DC | PRN

## 2020-10-28 MED ORDER — HEPARIN SODIUM (PORCINE) 1000 UNIT/ML DIALYSIS: 1000.0000 [IU] | INTRAMUSCULAR | Status: DC | PRN

## 2020-10-28 NOTE — Progress Notes (Signed)
Patient off the unit for dialysis.

## 2020-10-28 NOTE — Progress Notes (Signed)
PROGRESS NOTE                                                                                                                                                                                                             Patient Demographics:    [name] [name], is a 60 y.o. male, DOB - 07/06/1961, FFM:[id number]  Outpatient Primary MD for the patient is Patient, No Pcp Per    LOS - 2  Admit date - 10/25/2020    CC - confused     Brief Narrative (HPI from H&P) - [name] [name] is a 60 y.o. 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  History is obtained from the patient as well as family over the phone.  He had been having symptoms including cough and congestion over the last week and a half, he had missed 1 run of dialysis, was getting quite confused and family got concerned and brought him to the ER where he was diagnosed with COVID-19 pneumonia, metabolic encephalopathy admit to the hospital.   Subjective:   Patient in bed, appears comfortable, denies any headache, no fever, no chest pain or pressure, no shortness of breath , no abdominal pain. No focal weakness.   Assessment  & Plan :     1. Acute Covid 19 Viral Pneumonitis during the ongoing 2020 Covid 19 Pandemic - his immunization status is unknown but he states that he does not recall getting any vaccine, he is in for GERD mild to moderate parenchymal lung injury, has been started on steroids and Remdesivir x 3.  Minimal to no hypoxia, monitor closely  Encouraged the patient to sit up in chair in the daytime use I-S and flutter valve for pulmonary toiletry and then prone in bed when at night.  Will advance activity and titrate down oxygen as possible.   SpO2: 95 % O2 Flow Rate (L/min): 1 L/min  Recent Labs  Lab 10/25/20 1956  10/25/20 2050 10/25/20 2113 10/26/20 0341 10/27/20 0356 10/28/20 0200  WBC 5.0  --   --  3.2* 6.3 7.3  HGB 16.4  --   --  15.6 16.5 16.0  HCT 48.4  --   --  44.5 46.8 47.6  PLT  108*  --   --  83* 87* 83*  CRP  --   --   --  3.7* 3.4* 3.2*  BNP  --   --   --  >4,500.0* >4,500.0* >4,500.0*  DDIMER  --   --   --  2.12* 2.01* 2.13*  PROCALCITON  --   --   --  14.13  --   --   AST 276*  --   --  222* 165* 194*  ALT 254*  --   --  218* 189* 214*  ALKPHOS 74  --   --  71 74 84  BILITOT 1.4*  --   --  1.6* 1.2 0.8  ALBUMIN 4.2  --   --  3.4* 3.2* 3.2*  INR  --   --  1.3*  --   --  1.4*  SARSCOV2NAA  --  POSITIVE*  --   --   --   --     2.  Acute metabolic encephalopathy.  Dialyze, treat COVID-19 and monitor.  No focal deficits with stable head CT.  Mentation is improving.   3.  Mild asymptomatic transaminitis.  Likely due to viral infection and Remdesivir.  Monitor trend and INR.  4.  ESRD.  TTS schedule.  Renal on board.  5.  GERD.  On Pepcid.  6.  Mildly elevated D-dimer.  On heparin prophylactic dose, check leg venous ultrasound.    7. Mild viral infection induced acute on chronic thrombocytopenia (150s) .  Monitor.   Lab Results  Component Value Date   PLT 83 (L) 10/28/2020        Condition - Extremely Guarded  Family Communication  : Updated Sister Threasa Beards [phone] on 10/28/2020  Code Status :  Full  Consults  :  Renal  Procedures  :   Leg Venous Ultrasound.    CT head.  Nonacute  PUD Prophylaxis : None  Disposition Plan  :    Status is: Inpatient  Remains inpatient appropriate because:IV treatments appropriate due to intensity of illness or inability to take PO   Dispo: The patient is from: Home              Anticipated d/c is to: Home              Anticipated d/c date is: 3 days              Patient currently is not medically stable to d/c.  DVT Prophylaxis  :   Heparin    Lab Results  Component Value Date   PLT 83 (L) 10/28/2020    Diet  :  Diet Order            Diet renal with fluid restriction Fluid restriction: 1200 mL Fluid; Room service appropriate? Yes; Fluid consistency: Thin  Diet effective now                  Inpatient Medications  Scheduled Meds: . vitamin C  500 mg Oral Daily  . Chlorhexidine Gluconate Cloth  6 each Topical Q0600  . cinacalcet  30 mg Oral QPM  . doxercalciferol  3 mcg Intravenous Q M,W,F-HD  . famotidine  10 mg Oral Daily  . feeding supplement (NEPRO CARB STEADY)  237 mL Oral BID BM  . ferric citrate  630 mg Oral TID WC  . heparin  5,000 Units Subcutaneous Q8H  . mouth rinse  15 mL Mouth Rinse BID  . methylPREDNISolone (SOLU-MEDROL) injection  40 mg  Intravenous Q12H  . sodium chloride flush  3 mL Intravenous Q12H  . zinc sulfate  220 mg Oral Daily   Continuous Infusions: . cefTRIAXone (ROCEPHIN)  IV 2 g (10/27/20 1554)  . remdesivir 100 mg in NS 100 mL 100 mg (10/27/20 0955)   PRN Meds:.acetaminophen, albuterol, chlorpheniramine-HYDROcodone, fluticasone, guaiFENesin-dextromethorphan, hydrocortisone cream  Antibiotics  :    Anti-infectives (From admission, onward)   Start     Dose/Rate Route Frequency Ordered Stop   10/26/20 1530  cefTRIAXone (ROCEPHIN) 2 g in sodium chloride 0.9 % 100 mL IVPB        2 g 200 mL/hr over 30 Minutes Intravenous Every 24 hours 10/26/20 1442 10/31/20 1529   10/26/20 1000  remdesivir 100 mg in sodium chloride 0.9 % 100 mL IVPB        100 mg 200 mL/hr over 30 Minutes Intravenous Daily 10/25/20 2232 10/30/20 0959   10/25/20 2300  remdesivir 100 mg in sodium chloride 0.9 % 100 mL IVPB        100 mg 200 mL/hr over 30 Minutes Intravenous Every 30 min 10/25/20 2232 10/26/20 0006       Time Spent in minutes  30   Lala Lund M.D on 10/28/2020 at 8:55 AM  To page go to [email or web address]   [facility] -  Office  [phone]    See all Orders from today for further details    Objective:   Vitals:   10/27/20 1344 10/27/20 2121 10/28/20  0516 10/28/20 0517  BP: (!) 118/91 (!) 122/93 113/87   Pulse: 83 86 69   Resp: 16 20 18    Temp: (!) 97.5 F (36.4 C) 97.6 F (36.4 C) 97.8 F (36.6 C)   TempSrc: Oral Axillary Axillary   SpO2: 93% 94% (!) 88% 95%  Weight:      Height:        Wt Readings from Last 3 Encounters:  11/27/19 92.5 kg  09/18/19 91.6 kg  09/04/19 90.7 kg     Intake/Output Summary (Last 24 hours) at 10/28/2020 0855 Last data filed at 10/27/2020 1625 Gross per 24 hour  Intake 537 ml  Output --  Net 537 ml     Physical Exam  Awake Alert at times minimally confused, No new F.N deficits,   [place].AT,PERRAL Supple Neck,No JVD, No cervical lymphadenopathy appriciated.  Symmetrical Chest wall movement, Good air movement bilaterally, CTAB RRR,No Gallops, Rubs or new Murmurs, No Parasternal Heave +ve B.Sounds, Abd Soft, No tenderness, No organomegaly appriciated, No rebound - guarding or rigidity. No Cyanosis, Clubbing or edema, No new Large or bruise    Data Review:    CBC Recent Labs  Lab 10/25/20 1956 10/26/20 0341 10/27/20 0356 10/28/20 0200  WBC 5.0 3.2* 6.3 7.3  HGB 16.4 15.6 16.5 16.0  HCT 48.4 44.5 46.8 47.6  PLT 108* 83* 87* 83*  MCV 95.1 92.7 91.9 93.0  MCH 32.2 32.5 32.4 31.3  MCHC 33.9 35.1 35.3 33.6  RDW 14.2 14.1 14.1 14.0  LYMPHSABS 0.9  --  0.6* 0.5*  MONOABS 0.5  --  0.3 0.3  EOSABS 0.0  --  0.0 0.0  BASOSABS 0.0  --  0.0 0.0    Recent Labs  Lab 10/25/20 1956 10/25/20 2113 10/26/20 0341 10/26/20 1527 10/27/20 0356 10/28/20 0200  NA 137  --  138  --  139 137  K 5.2*  --  6.0*  --  5.7* 5.4*  CL 87*  --  89*  --  92*  91*  CO2 26  --  24  --  26 24  GLUCOSE 119*  --  137*  --  168* 169*  BUN 72*  --  75*  --  72* 110*  CREATININE 16.69*  --  17.81*  --  14.10* 15.81*  CALCIUM 8.8*  --  8.7*  --  7.9* 7.8*  AST 276*  --  222*  --  165* 194*  ALT 254*  --  218*  --  189* 214*  ALKPHOS 74  --  71  --  74 84  BILITOT 1.4*  --  1.6*  --  1.2 0.8  ALBUMIN 4.2  --   3.4*  --  3.2* 3.2*  MG 2.1  --   --   --  2.2 2.6*  CRP  --   --  3.7*  --  3.4* 3.2*  DDIMER  --   --  2.12*  --  2.01* 2.13*  PROCALCITON  --   --  14.13  --   --   --   INR  --  1.3*  --   --   --  1.4*  TSH  --   --   --  0.276*  --   --   AMMONIA  --  19  --   --   --   --   BNP  --   --  >4,500.0*  --  >4,500.0* >4,500.0*    ------------------------------------------------------------------------------------------------------------------ No results for input(s): CHOL, HDL, LDLCALC, TRIG, CHOLHDL, LDLDIRECT in the last 72 hours.  Lab Results  Component Value Date   HGBA1C 5.5 04/09/2014   ------------------------------------------------------------------------------------------------------------------ Recent Labs    10/26/20 1527  TSH 0.276*    Cardiac Enzymes No results for input(s): CKMB, TROPONINI, MYOGLOBIN in the last 168 hours.  Invalid input(s): CK ------------------------------------------------------------------------------------------------------------------    Component Value Date/Time   BNP >4,500.0 (H) 10/28/2020 0200    Micro Results Recent Results (from the past 240 hour(s))  Resp Panel by RT-PCR (Flu A&B, Covid) Nasopharyngeal Swab     Status: Abnormal   Collection Time: 10/25/20  8:50 PM   Specimen: Nasopharyngeal Swab; Nasopharyngeal(NP) swabs in vial transport medium  Result Value Ref Range Status   SARS Coronavirus 2 by RT PCR POSITIVE (A) NEGATIVE Final    Comment: RESULT CALLED TO, READ BACK BY AND VERIFIED WITH: Jaci Carrel RN AT 2155 ON 10/25/20 BY I.SUGUT (NOTE) SARS-CoV-2 target nucleic acids are DETECTED.  The SARS-CoV-2 RNA is generally detectable in upper respiratory specimens during the acute phase of infection. Positive results are indicative of the presence of the identified virus, but do not rule out bacterial infection or co-infection with other pathogens not detected by the test. Clinical correlation with patient history  and other diagnostic information is necessary to determine patient infection status. The expected result is Negative.  Fact Sheet for Patients: EntrepreneurPulse.com.au  Fact Sheet for Healthcare Providers: IncredibleEmployment.be  This test is not yet approved or cleared by the [place] FDA and  has been authorized for detection and/or diagnosis of SARS-CoV-2 by FDA under an Emergency Use Authorization (EUA).  This EUA will remain in effect (meaning th is test can be used) for the duration of  the COVID-19 declaration under Section 564(b)(1) of the Act, 21 U.S.C. section 360bbb-3(b)(1), unless the authorization is terminated or revoked sooner.     Influenza A by PCR NEGATIVE NEGATIVE Final   Influenza B by PCR NEGATIVE NEGATIVE Final    Comment: (NOTE) The  Xpert Xpress SARS-CoV-2/FLU/RSV plus assay is intended as an aid in the diagnosis of influenza from Nasopharyngeal swab specimens and should not be used as a sole basis for treatment. Nasal washings and aspirates are unacceptable for Xpert Xpress SARS-CoV-2/FLU/RSV testing.  Fact Sheet for Patients: EntrepreneurPulse.com.au  Fact Sheet for Healthcare Providers: IncredibleEmployment.be  This test is not yet approved or cleared by the [place] FDA and has been authorized for detection and/or diagnosis of SARS-CoV-2 by FDA under an Emergency Use Authorization (EUA). This EUA will remain in effect (meaning this test can be used) for the duration of the COVID-19 declaration under Section 564(b)(1) of the Act, 21 U.S.C. section 360bbb-3(b)(1), unless the authorization is terminated or revoked.  Performed at [facility], [address] [place], [place] [address]   MRSA PCR Screening     Status: None   Collection Time: 10/26/20  6:50 AM   Specimen: Nasal Mucosa; Nasopharyngeal  Result Value Ref Range Status   MRSA by PCR NEGATIVE  NEGATIVE Final    Comment:        The GeneXpert MRSA Assay (FDA approved for NASAL specimens only), is one component of a comprehensive MRSA colonization surveillance program. It is not intended to diagnose MRSA infection nor to guide or monitor treatment for MRSA infections. Performed at [facility], [place] [address], [place], [place] [address]     Radiology Reports CT Head Wo Contrast  Result Date: 10/25/2020 CLINICAL DATA:  60 year old male with altered mental status. EXAM: CT HEAD WITHOUT CONTRAST TECHNIQUE: Contiguous axial images were obtained from the base of the skull through the vertex without intravenous contrast. COMPARISON:  None. FINDINGS: Evaluation of this exam is limited due to motion artifact. Brain: The ventricles and sulci appropriate size for patient's age. The gray-white matter discrimination is preserved. There is no acute intracranial hemorrhage. No mass effect or midline shift. No extra-axial fluid collection. Vascular: No hyperdense vessel or unexpected calcification. Skull: Normal. Negative for fracture or focal lesion. Sinuses/Orbits: No acute finding. Other: None IMPRESSION: No acute intracranial pathology. Electronically Signed   By: Anner Crete M.D.   On: 10/25/2020 20:23   DG Chest Port 1 View  Result Date: 10/27/2020 CLINICAL DATA:  Cough, shortness of breath, COVID positive. EXAM: PORTABLE CHEST 1 VIEW COMPARISON:  Chest x-ray dated 10/25/2020. FINDINGS: Stable cardiomegaly. Patchy bilateral airspace opacities are stable, compatible with multifocal pneumonia superimposed on chronic interstitial lung disease/emphysema. No pleural effusion or pneumothorax is seen. Osseous structures about the chest are unremarkable. IMPRESSION: 1. Stable multifocal pneumonia superimposed on chronic interstitial lung disease/emphysema. 2. Stable cardiomegaly. Electronically Signed   By: Franki Cabot M.D.   On: 10/27/2020 08:38   DG Chest Port 1 View  Result Date:  10/25/2020 CLINICAL DATA:  Altered mental status.  COVID positive. EXAM: PORTABLE CHEST 1 VIEW COMPARISON:  09/14/2018 FINDINGS: Symmetric low lung volumes. Patchy and heterogeneous bilateral lung opacities. Upper normal heart size with unchanged mediastinal contours. There may be a vascular congestion. No significant pleural effusion. No pneumothorax. No acute osseous abnormalities are seen. IMPRESSION: 1. Patchy and heterogeneous bilateral lung opacities, pattern typical of COVID-19 pneumonia. 2. Possible vascular congestion. Electronically Signed   By: Keith Rake M.D.   On: 10/25/2020 20:15

## 2020-10-28 NOTE — Evaluation (Signed)
Physical Therapy Evaluation Patient Details Name: [name] [name] MRN: [id number] DOB: [birth year] Today's Date: 10/28/2020   History of Present Illness  59 y.o. 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 +COVID pna (?initial positive 1/3)  Clinical Impression   Pt admitted with above diagnosis. Patient on disability related to dialysis, however very independent PTA. Currently with decr balance and endurance with activity. On room air, his sats remained 98-99% when walking 200 ft. Patient unsteady with only hospital socks, and greatly improved with his shoes donned.  Pt currently with functional limitations due to the deficits listed below (see PT Problem List). Pt will benefit from skilled PT to increase their independence and safety with mobility to allow discharge to the venue listed below.       Follow Up Recommendations No PT follow up    Equipment Recommendations  None recommended by PT    Recommendations for Other Services OT consult     Precautions / Restrictions Precautions Precautions: Fall Precaution Comments: unsteady      Mobility  Bed Mobility                    Transfers Overall transfer level: Needs assistance Equipment used: None Transfers: Sit to/from Stand Sit to Stand: Min guard         General transfer comment: pt reporting dizziness upon standing; not orthostatic  Ambulation/Gait Ambulation/Gait assistance: Min assist Gait Distance (Feet): 200 Feet (seated rest, 100) Assistive device: None Gait Pattern/deviations: Step-through pattern;Decreased stride length;Drifts right/left;Staggering left;Staggering right;Wide base of support Gait velocity: to quick for his capacity; RR up to 38, seated rest   General Gait Details: pt wearing hospital socks initially and reporting he balances better in his shoes; shoes  donned and indeed balance improved to min guard assist  Stairs            Wheelchair Mobility    Modified Rankin (Stroke Patients Only)       Balance Overall balance assessment: Mild deficits observed, not formally tested                                           Pertinent Vitals/Pain Pain Assessment: No/denies pain    Home Living Family/patient expects to be discharged to:: Private residence Living Arrangements: Other relatives;Children;Parent Available Help at Discharge: Family;Available 24 hours/day Type of Home: House Home Access: Stairs to enter Entrance Stairs-Rails: Right Entrance Stairs-Number of Steps: 2 Home Layout: Multi-level (bedroom downstairs) Home Equipment: None      Prior Function Level of Independence: Independent         Comments: prefers to take baths; on disability, works from home when able; states he fell at home when he tripped over his laundry when not feeling well     Hand Dominance        Extremity/Trunk Assessment   Upper Extremity Assessment Upper Extremity Assessment: Defer to OT evaluation    Lower Extremity Assessment Lower Extremity Assessment: Generalized weakness    Cervical / Trunk Assessment Cervical / Trunk Assessment: Normal  Communication   Communication: No difficulties  Cognition Arousal/Alertness: Awake/alert Behavior During Therapy: WFL for tasks assessed/performed Overall Cognitive Status: Within Functional Limits for tasks assessed  General Comments General comments (skin integrity, edema, etc.): on RA throughout with sats 98-99%; RR 20-38    Exercises Other Exercises Other Exercises: IS x 5 reps; able to pull 105ml to 1242ml   Assessment/Plan    PT Assessment Patient needs continued PT services  PT Problem List Decreased strength;Decreased activity tolerance;Decreased balance;Decreased mobility;Decreased knowledge of  use of DME;Cardiopulmonary status limiting activity       PT Treatment Interventions DME instruction;Gait training;Stair training;Functional mobility training;Therapeutic activities;Therapeutic exercise;Balance training;Patient/family education    PT Goals (Current goals can be found in the Care Plan section)  Acute Rehab PT Goals Patient Stated Goal: go home tomorrow PT Goal Formulation: With patient Time For Goal Achievement: 11/11/20 Potential to Achieve Goals: Good    Frequency Min 3X/week   Barriers to discharge        Co-evaluation               AM-PAC PT "6 Clicks" Mobility  Outcome Measure Help needed turning from your back to your side while in a flat bed without using bedrails?: None Help needed moving from lying on your back to sitting on the side of a flat bed without using bedrails?: None Help needed moving to and from a bed to a chair (including a wheelchair)?: A Little Help needed standing up from a chair using your arms (e.g., wheelchair or bedside chair)?: A Little Help needed to walk in hospital room?: A Little Help needed climbing 3-5 steps with a railing? : A Little 6 Click Score: 20    End of Session Equipment Utilized During Treatment: Gait belt;Oxygen Activity Tolerance: Patient limited by fatigue Patient left: in chair;with call bell/phone within reach   PT Visit Diagnosis: Unsteadiness on feet (R26.81)    Time: 8299-3716 PT Time Calculation (min) (ACUTE ONLY): 28 min   Charges:   PT Evaluation $PT Eval Moderate Complexity: 1 Mod PT Treatments $Gait Training: 8-22 mins         Arby Barrette, PT Pager [phone]   Rexanne Mano 10/28/2020, 9:46 AM

## 2020-10-28 NOTE — Plan of Care (Signed)
VSS. Remains on room air. OOB to BR. Patient c/o sweating all night, sheets changed multiple times. Call bell within reach.   Problem: Education: Goal: Knowledge of risk factors and measures for prevention of condition will improve Outcome: Progressing   Problem: Respiratory: Goal: Will maintain a patent airway Outcome: Progressing Goal: Complications related to the disease process, condition or treatment will be avoided or minimized Outcome: Progressing

## 2020-10-28 NOTE — Progress Notes (Signed)
OT Cancellation Note  Patient Details Name: Azir Muzyka MRN: [id number] DOB: 02-Mar-1961   Cancelled Treatment:    Reason Eval/Treat Not Completed: Patient at procedure or test/ unavailable; (HD), will follow up for OT eval as able.  Lou Cal, OT Acute Rehabilitation Services Pager [phone] Office [phone]   Raymondo Band 10/28/2020, 11:02 AM

## 2020-10-28 NOTE — Progress Notes (Signed)
[facility] Progress Note   Subjective:  Seen in room. No new c/o's.   Objective Vitals:   10/28/20 1030 10/28/20 1100 10/28/20 1130 10/28/20 1200  BP: 119/81 117/80 104/80 (!) 129/93  Pulse: 78 76  77  Resp:  17 19 (!) 21  Temp:      TempSrc:      SpO2:      Weight:      Height:         Additional Objective Labs: Basic Metabolic Panel: Recent Labs  Lab 10/26/20 0341 10/27/20 0356 10/28/20 0200  NA 138 139 137  K 6.0* 5.7* 5.4*  CL 89* 92* 91*  CO2 24 26 24   GLUCOSE 378* 168* 169*  BUN 75* 72* 110*  CREATININE 17.81* 14.10* 15.81*  CALCIUM 8.7* 7.9* 7.8*  PHOS  --  8.7* 10.2*   CBC: Recent Labs  Lab 10/25/20 1956 10/26/20 0341 10/27/20 0356 10/28/20 0200  WBC 5.0 3.2* 6.3 7.3  NEUTROABS 3.5  --  5.4 6.5  HGB 16.4 15.6 16.5 16.0  HCT 48.4 44.5 46.8 47.6  MCV 95.1 92.7 91.9 93.0  PLT 108* 83* 87* 83*   Blood Culture    Component Value Date/Time   SDES BLOOD RIGHT HAND 10/27/2020 0356   SPECREQUEST  10/27/2020 0356    BOTTLES DRAWN AEROBIC AND ANAEROBIC Blood Culture adequate volume   CULT  10/27/2020 0356    NO GROWTH 1 DAY Performed at [facility], [place] [address], [place], [place] [address]    REPTSTATUS PENDING 10/27/2020 2774     Physical Exam General: WNWD, nad, sitting in recliner on RA  Heart: RRR Lungs: Clear bilaterally  Abdomen: soft, non-tender  Extremities: Trace LE edema  Dialysis Access: LUE AVF +bruit   Medications: . [START ON 10/29/2020] sodium chloride    . [START ON 10/29/2020] sodium chloride    . cefTRIAXone (ROCEPHIN)  IV 2 g (10/27/20 1554)   . vitamin C  500 mg Oral Daily  . Chlorhexidine Gluconate Cloth  6 each Topical Q0600  . cinacalcet  30 mg Oral QPM  . doxercalciferol  3 mcg Intravenous Q M,W,F-HD  . famotidine  10 mg Oral Daily  . feeding supplement (NEPRO CARB STEADY)  237 mL Oral BID BM  . ferric citrate  630 mg Oral TID WC  . heparin  5,000 Units Subcutaneous Q8H  . mouth rinse   15 mL Mouth Rinse BID  . methylPREDNISolone (SOLU-MEDROL) injection  40 mg Intravenous Daily  . sodium chloride flush  3 mL Intravenous Q12H  . zinc sulfate  220 mg Oral Daily    NW TTS > now getting MWF in prep for dc to OP mwf COVID shift   4h   400/800   2K/2Ca    86 kg (left 83kg last hd)  AVF Hep 5600 Hectorol 3 TIW Auryixa 3 qac, Sensipar 30 qd   Assessment/Plan: 1. COVID-19 PNA. Remdesivir, steroids per primary team.  2. Acute encephalopathy. Likely 2/2 COVID infection. No acute findings on head CT. Improving.  3. ESRD -  Usual TTS. Now MWF for Covid shift. Had urgent HD 1/8.  Next HD today 1/10.  4. Hypertension/volume  - BP ok. Some volume on exam. Attempt another  3L UF with HD today. Has been under OP dry weight. Will need lower EDW at discharge.  5.  Anemia  - Hgb >15. No ESA needs.  6.  Metabolic bone disease -  Ca ok. Continue home binders/hectorol .  Kelly Splinter, MD 10/28/2020, 12:22 PM

## 2020-10-29 ENCOUNTER — Inpatient Hospital Stay (HOSPITAL_COMMUNITY): Payer: Medicare (Managed Care)

## 2020-10-29 DIAGNOSIS — R7989 Other specified abnormal findings of blood chemistry: Secondary | ICD-10-CM | POA: Diagnosis not present

## 2020-10-29 LAB — COMPREHENSIVE METABOLIC PANEL
ALT: 226 U/L — ABNORMAL HIGH (ref 0–44)
AST: 194 U/L — ABNORMAL HIGH (ref 15–41)
Albumin: 3.1 g/dL — ABNORMAL LOW (ref 3.5–5.0)
Alkaline Phosphatase: 76 U/L (ref 38–126)
Anion gap: 18 — ABNORMAL HIGH (ref 5–15)
BUN: 84 mg/dL — ABNORMAL HIGH (ref 6–20)
CO2: 24 mmol/L (ref 22–32)
Calcium: 7.7 mg/dL — ABNORMAL LOW (ref 8.9–10.3)
Chloride: 96 mmol/L — ABNORMAL LOW (ref 98–111)
Creatinine, Ser: 11.79 mg/dL — ABNORMAL HIGH (ref 0.61–1.24)
GFR, Estimated: 5 mL/min — ABNORMAL LOW (ref >60)
Glucose, Bld: 170 mg/dL — ABNORMAL HIGH (ref 70–99)
Potassium: 5 mmol/L (ref 3.5–5.1)
Sodium: 138 mmol/L (ref 135–145)
Total Bilirubin: 0.9 mg/dL (ref 0.3–1.2)
Total Protein: 5.6 g/dL — ABNORMAL LOW (ref 6.5–8.1)

## 2020-10-29 LAB — CBC WITH DIFFERENTIAL/PLATELET
Abs Immature Granulocytes: 0.04 10*3/uL (ref 0.00–0.07)
Basophils Absolute: 0 10*3/uL (ref 0.0–0.1)
Basophils Relative: 0 %
Eosinophils Absolute: 0 10*3/uL (ref 0.0–0.5)
Eosinophils Relative: 0 %
HCT: 41.4 % (ref 39.0–52.0)
Hemoglobin: 14.5 g/dL (ref 13.0–17.0)
Immature Granulocytes: 1 %
Lymphocytes Relative: 4 %
Lymphs Abs: 0.3 10*3/uL — ABNORMAL LOW (ref 0.7–4.0)
MCH: 32.3 pg (ref 26.0–34.0)
MCHC: 35 g/dL (ref 30.0–36.0)
MCV: 92.2 fL (ref 80.0–100.0)
Monocytes Absolute: 0.4 10*3/uL (ref 0.1–1.0)
Monocytes Relative: 5 %
Neutro Abs: 6.8 10*3/uL (ref 1.7–7.7)
Neutrophils Relative %: 90 %
Platelets: 76 10*3/uL — ABNORMAL LOW (ref 150–400)
RBC: 4.49 MIL/uL (ref 4.22–5.81)
RDW: 14.4 % (ref 11.5–15.5)
WBC: 7.5 10*3/uL (ref 4.0–10.5)
nRBC: 0.3 % — ABNORMAL HIGH (ref 0.0–0.2)

## 2020-10-29 LAB — TSH: TSH: 0.265 u[IU]/mL — ABNORMAL LOW (ref 0.350–4.500)

## 2020-10-29 LAB — GLUCOSE, CAPILLARY
Glucose-Capillary: 164 mg/dL — ABNORMAL HIGH (ref 70–99)
Glucose-Capillary: 208 mg/dL — ABNORMAL HIGH (ref 70–99)

## 2020-10-29 LAB — D-DIMER, QUANTITATIVE: D-Dimer, Quant: 3.38 ug{FEU}/mL — ABNORMAL HIGH (ref 0.00–0.50)

## 2020-10-29 LAB — FERRITIN: Ferritin: 3738 ng/mL — ABNORMAL HIGH (ref 24–336)

## 2020-10-29 LAB — BRAIN NATRIURETIC PEPTIDE: B Natriuretic Peptide: >4500 pg/mL — ABNORMAL HIGH (ref 0.0–100.0)

## 2020-10-29 LAB — CORTISOL: Cortisol, Plasma: 9.5 ug/dL

## 2020-10-29 LAB — PHOSPHORUS: Phosphorus: 6.8 mg/dL — ABNORMAL HIGH (ref 2.5–4.6)

## 2020-10-29 LAB — MAGNESIUM: Magnesium: 2.6 mg/dL — ABNORMAL HIGH (ref 1.7–2.4)

## 2020-10-29 LAB — PROTIME-INR
INR: 1.3 — ABNORMAL HIGH (ref 0.8–1.2)
Prothrombin Time: 15.3 seconds — ABNORMAL HIGH (ref 11.4–15.2)

## 2020-10-29 LAB — C-REACTIVE PROTEIN: CRP: 2.1 mg/dL — ABNORMAL HIGH

## 2020-10-29 NOTE — Discharge Summary (Addendum)
AMA  Patient at this time expresses desire to leave the Hospital immidiately, patient has been warned that this is not Medically advisable at this time, and can result in Medical complications like Death and Disability, patient understands and accepts the risks involved and assumes full responsibilty of this decision.   Lala Lund M.D on 10/29/2020 at 3:11 PM  [facility]  Time < 30 minutes  Last Note Below                                                                      PROGRESS NOTE                                                                                                                                                                                                             Patient Demographics:    [name] [name], is a 60 y.o. male, DOB - 03/27/1961, HFW:[id number]  Outpatient Primary MD for the patient is Patient, No Pcp Per    LOS - 3  Admit date - 10/25/2020    CC - confused     Brief Narrative (HPI from H&P) - [name] [name] is a 60 y.o. 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  History is obtained from the patient as well as family over the phone.  He had been having symptoms including cough and congestion over the last week and a half, he had missed 1 run of dialysis, was getting quite confused and family got concerned and brought him to the ER where he was diagnosed with COVID-19 pneumonia, metabolic encephalopathy admit to the hospital.   Subjective:   Patient in bed, appears comfortable, denies any headache, no fever, no chest pain or pressure, no shortness of breath , no abdominal pain. No focal weakness.    Assessment  & Plan :  1. Acute Covid 19 Viral  Pneumonitis during the ongoing 2020 Covid 19 Pandemic - his immunization status is unknown but he states that he does not recall getting any vaccine, he is in for GERD mild to moderate parenchymal lung injury, has been started on steroids and Remdesivir x 3.  Minimal to no hypoxia, monitor closely  Encouraged the patient to sit up in chair in the daytime use I-S and flutter valve for pulmonary toiletry and then prone in bed when at night.  Will advance activity and titrate down oxygen as possible.   SpO2: 99 % O2 Flow Rate (L/min): 1 L/min  Recent Labs  Lab 10/25/20 1956 10/25/20 2050 10/25/20 2113 10/26/20 0341 10/27/20 0356 10/28/20 0200 10/29/20 0224 10/29/20 0309  WBC 5.0  --   --  3.2* 6.3 7.3 7.5  --   HGB 16.4  --   --  15.6 16.5 16.0 14.5  --   HCT 48.4  --   --  44.5 46.8 47.6 41.4  --   PLT 108*  --   --  83* 87* 83* 76*  --   CRP  --   --   --  3.7* 3.4* 3.2* 2.1*  --   BNP  --   --   --  >4,500.0* >4,500.0* >4,500.0* >4,500.0*  --   DDIMER  --   --   --  2.12* 2.01* 2.13*  --  3.38*  PROCALCITON  --   --   --  14.13  --   --   --   --   AST 276*  --   --  222* 165* 194* 194*  --   ALT 254*  --   --  218* 189* 214* 226*  --   ALKPHOS 74  --   --  71 74 84 76  --   BILITOT 1.4*  --   --  1.6* 1.2 0.8 0.9  --   ALBUMIN 4.2  --   --  3.4* 3.2* 3.2* 3.1*  --   INR  --   --  1.3*  --   --  1.4*  --  1.3*  SARSCOV2NAA  --  POSITIVE*  --   --   --   --   --   --     2.  Acute metabolic encephalopathy.  Dialyze, treat COVID-19 and monitor.  No focal deficits with stable head CT.  Mentation is improving and now at baseline per his sister who talked with him.   3.  Mild asymptomatic transaminitis.  Likely due to viral infection and Remdesivir.  Monitor trend and INR.  4.  ESRD.  TTS schedule.  Renal on board.  5.  GERD.  On Pepcid.  6.  Mildly elevated D-dimer.  On heparin prophylactic dose, pending leg venous ultrasound.    7. Mild viral infection induced acute on  chronic thrombocytopenia (150s) .  Monitor.   Lab Results  Component Value Date   PLT 76 (L) 10/29/2020        Condition - Extremely Guarded  Family Communication  : Updated Sister Threasa Beards [phone] on 10/28/2020, 10/29/20  Code Status :  Full  Consults  :  Renal  Procedures  :   Leg Venous Ultrasound -     CT head.  Nonacute  PUD Prophylaxis : None  Disposition Plan  :    Status is: Inpatient  Remains inpatient appropriate because:IV treatments appropriate due to intensity of illness or inability to take  PO   Dispo: The patient is from: Home              Anticipated d/c is to: Home              Anticipated d/c date is: 3 days              Patient currently is not medically stable to d/c.  DVT Prophylaxis  :   Heparin    Lab Results  Component Value Date   PLT 76 (L) 10/29/2020    Diet :  Diet Order            Diet renal with fluid restriction Fluid restriction: 1200 mL Fluid; Room service appropriate? Yes; Fluid consistency: Thin  Diet effective now                  Inpatient Medications  Scheduled Meds: . vitamin C  500 mg Oral Daily  . Chlorhexidine Gluconate Cloth  6 each Topical Q0600  . cinacalcet  30 mg Oral QPM  . doxercalciferol  3 mcg Intravenous Q M,W,F-HD  . famotidine  10 mg Oral Daily  . feeding supplement (NEPRO CARB STEADY)  237 mL Oral BID BM  . ferric citrate  630 mg Oral TID WC  . heparin  5,000 Units Subcutaneous Q8H  . mouth rinse  15 mL Mouth Rinse BID  . methylPREDNISolone (SOLU-MEDROL) injection  40 mg Intravenous Daily  . sodium chloride flush  3 mL Intravenous Q12H  . zinc sulfate  220 mg Oral Daily   Continuous Infusions: . cefTRIAXone (ROCEPHIN)  IV Stopped (10/28/20 1551)   PRN Meds:.acetaminophen, albuterol, chlorpheniramine-HYDROcodone, fluticasone, guaiFENesin-dextromethorphan, hydrocortisone cream  Antibiotics  :    Anti-infectives (From admission, onward)   Start     Dose/Rate Route Frequency Ordered Stop    10/26/20 1530  cefTRIAXone (ROCEPHIN) 2 g in sodium chloride 0.9 % 100 mL IVPB        2 g 200 mL/hr over 30 Minutes Intravenous Every 24 hours 10/26/20 1442 10/31/20 1559   10/26/20 1000  remdesivir 100 mg in sodium chloride 0.9 % 100 mL IVPB  Status:  Discontinued        100 mg 200 mL/hr over 30 Minutes Intravenous Daily 10/25/20 2232 10/28/20 0936   10/25/20 2300  remdesivir 100 mg in sodium chloride 0.9 % 100 mL IVPB        100 mg 200 mL/hr over 30 Minutes Intravenous Every 30 min 10/25/20 2232 10/26/20 0006       Time Spent in minutes  30   Lala Lund M.D on 10/29/2020 at 3:11 PM  To page go to [email or web address]   [facility] -  Office  [phone]    See all Orders from today for further details    Objective:   Vitals:   10/28/20 1330 10/28/20 1345 10/28/20 2059 10/29/20 0437  BP: 113/77 113/77 116/89 118/86  Pulse: 74 70 77 74  Resp: 18  19 20   Temp:  98.4 F (36.9 C)  (!) 95 F (35 C)  TempSrc:  Axillary  Axillary  SpO2: 98%  98% 99%  Weight: 80.4 kg     Height:        Wt Readings from Last 3 Encounters:  10/28/20 80.4 kg  11/27/19 92.5 kg  09/18/19 91.6 kg     Intake/Output Summary (Last 24 hours) at 10/29/2020 1511 Last data filed at 10/29/2020 0511 Gross per 24 hour  Intake 120 ml  Output -  Net 120 ml     Physical Exam  Awake Alert, No new F.N deficits, Normal affect [place].AT,PERRAL Supple Neck,No JVD, No cervical lymphadenopathy appriciated.  Symmetrical Chest wall movement, Good air movement bilaterally, CTAB RRR,No Gallops, Rubs or new Murmurs, No Parasternal Heave +ve B.Sounds, Abd Soft, No tenderness, No organomegaly appriciated, No rebound - guarding or rigidity. No Cyanosis, Clubbing or edema, No new Morson or bruise    Data Review:    CBC Recent Labs  Lab 10/25/20 1956 10/26/20 0341 10/27/20 0356 10/28/20 0200 10/29/20 0224  WBC 5.0 3.2* 6.3 7.3 7.5  HGB 16.4 15.6 16.5 16.0 14.5  HCT 48.4 44.5 46.8 47.6 41.4  PLT  108* 83* 87* 83* 76*  MCV 95.1 92.7 91.9 93.0 92.2  MCH 32.2 32.5 32.4 31.3 32.3  MCHC 33.9 35.1 35.3 33.6 35.0  RDW 14.2 14.1 14.1 14.0 14.4  LYMPHSABS 0.9  --  0.6* 0.5* 0.3*  MONOABS 0.5  --  0.3 0.3 0.4  EOSABS 0.0  --  0.0 0.0 0.0  BASOSABS 0.0  --  0.0 0.0 0.0    Recent Labs  Lab 10/25/20 1956 10/25/20 2113 10/26/20 0341 10/26/20 1527 10/27/20 0356 10/28/20 0200 10/29/20 0224 10/29/20 0309 10/29/20 0657  NA 137  --  138  --  139 137 138  --   --   K 5.2*  --  6.0*  --  5.7* 5.4* 5.0  --   --   CL 87*  --  89*  --  92* 91* 96*  --   --   CO2 26  --  24  --  26 24 24   --   --   GLUCOSE 119*  --  137*  --  168* 169* 170*  --   --   BUN 72*  --  75*  --  72* 110* 84*  --   --   CREATININE 16.69*  --  17.81*  --  14.10* 15.81* 11.79*  --   --   CALCIUM 8.8*  --  8.7*  --  7.9* 7.8* 7.7*  --   --   AST 276*  --  222*  --  165* 194* 194*  --   --   ALT 254*  --  218*  --  189* 214* 226*  --   --   ALKPHOS 74  --  71  --  74 84 76  --   --   BILITOT 1.4*  --  1.6*  --  1.2 0.8 0.9  --   --   ALBUMIN 4.2  --  3.4*  --  3.2* 3.2* 3.1*  --   --   MG 2.1  --   --   --  2.2 2.6* 2.6*  --   --   CRP  --   --  3.7*  --  3.4* 3.2* 2.1*  --   --   DDIMER  --   --  2.12*  --  2.01* 2.13*  --  3.38*  --   PROCALCITON  --   --  14.13  --   --   --   --   --   --   INR  --  1.3*  --   --   --  1.4*  --  1.3*  --   TSH  --   --   --  0.276*  --   --   --   --  0.265*  AMMONIA  --  19  --   --   --   --   --   --   --   BNP  --   --  >4,500.0*  --  >4,500.0* >4,500.0* >4,500.0*  --   --     ------------------------------------------------------------------------------------------------------------------ No results for input(s): CHOL, HDL, LDLCALC, TRIG, CHOLHDL, LDLDIRECT in the last 72 hours.  Lab Results  Component Value Date   HGBA1C 5.5 04/09/2014   ------------------------------------------------------------------------------------------------------------------ Recent Labs     10/29/20 0657  TSH 0.265*    Cardiac Enzymes No results for input(s): CKMB, TROPONINI, MYOGLOBIN in the last 168 hours.  Invalid input(s): CK ------------------------------------------------------------------------------------------------------------------    Component Value Date/Time   BNP >4,500.0 (H) 10/29/2020 0224    Micro Results Recent Results (from the past 240 hour(s))  Resp Panel by RT-PCR (Flu A&B, Covid) Nasopharyngeal Swab     Status: Abnormal   Collection Time: 10/25/20  8:50 PM   Specimen: Nasopharyngeal Swab; Nasopharyngeal(NP) swabs in vial transport medium  Result Value Ref Range Status   SARS Coronavirus 2 by RT PCR POSITIVE (A) NEGATIVE Final    Comment: RESULT CALLED TO, READ BACK BY AND VERIFIED WITH: Jaci Carrel RN AT 2155 ON 10/25/20 BY I.SUGUT (NOTE) SARS-CoV-2 target nucleic acids are DETECTED.  The SARS-CoV-2 RNA is generally detectable in upper respiratory specimens during the acute phase of infection. Positive results are indicative of the presence of the identified virus, but do not rule out bacterial infection or co-infection with other pathogens not detected by the test. Clinical correlation with patient history and other diagnostic information is necessary to determine patient infection status. The expected result is Negative.  Fact Sheet for Patients: EntrepreneurPulse.com.au  Fact Sheet for Healthcare Providers: IncredibleEmployment.be  This test is not yet approved or cleared by the [place] FDA and  has been authorized for detection and/or diagnosis of SARS-CoV-2 by FDA under an Emergency Use Authorization (EUA).  This EUA will remain in effect (meaning th is test can be used) for the duration of  the COVID-19 declaration under Section 564(b)(1) of the Act, 21 U.S.C. section 360bbb-3(b)(1), unless the authorization is terminated or revoked sooner.     Influenza A by PCR NEGATIVE NEGATIVE  Final   Influenza B by PCR NEGATIVE NEGATIVE Final    Comment: (NOTE) The Xpert Xpress SARS-CoV-2/FLU/RSV plus assay is intended as an aid in the diagnosis of influenza from Nasopharyngeal swab specimens and should not be used as a sole basis for treatment. Nasal washings and aspirates are unacceptable for Xpert Xpress SARS-CoV-2/FLU/RSV testing.  Fact Sheet for Patients: EntrepreneurPulse.com.au  Fact Sheet for Healthcare Providers: IncredibleEmployment.be  This test is not yet approved or cleared by the [place] FDA and has been authorized for detection and/or diagnosis of SARS-CoV-2 by FDA under an Emergency Use Authorization (EUA). This EUA will remain in effect (meaning this test can be used) for the duration of the COVID-19 declaration under Section 564(b)(1) of the Act, 21 U.S.C. section 360bbb-3(b)(1), unless the authorization is terminated or revoked.  Performed at [facility], [address] [place], [place] [address]   MRSA PCR Screening     Status: None   Collection Time: 10/26/20  6:50 AM   Specimen: Nasal Mucosa; Nasopharyngeal  Result Value Ref Range Status   MRSA by PCR NEGATIVE NEGATIVE Final    Comment:        The GeneXpert MRSA Assay (FDA approved for NASAL specimens only), is one component of a comprehensive MRSA  colonization surveillance program. It is not intended to diagnose MRSA infection nor to guide or monitor treatment for MRSA infections. Performed at [facility], [place] [address], [place], [place] [address]   Culture, blood (routine x 2) Call MD if unable to obtain prior to antibiotics being given     Status: None (Preliminary result)   Collection Time: 10/27/20  3:55 AM   Specimen: BLOOD  Result Value Ref Range Status   Specimen Description BLOOD RIGHT ANTECUBITAL  Final   Special Requests   Final    BOTTLES DRAWN AEROBIC AND ANAEROBIC Blood Culture adequate volume   Culture    Final    NO GROWTH 2 DAYS Performed at [facility], [address] [place], [place] [address]    Report Status PENDING  Incomplete  Culture, blood (routine x 2) Call MD if unable to obtain prior to antibiotics being given     Status: None (Preliminary result)   Collection Time: 10/27/20  3:56 AM   Specimen: BLOOD RIGHT HAND  Result Value Ref Range Status   Specimen Description BLOOD RIGHT HAND  Final   Special Requests   Final    BOTTLES DRAWN AEROBIC AND ANAEROBIC Blood Culture adequate volume   Culture   Final    NO GROWTH 2 DAYS Performed at [facility], [place] [address], [place], [place] [address]    Report Status PENDING  Incomplete    Radiology Reports CT Head Wo Contrast  Result Date: 10/25/2020 CLINICAL DATA:  60 year old male with altered mental status. EXAM: CT HEAD WITHOUT CONTRAST TECHNIQUE: Contiguous axial images were obtained from the base of the skull through the vertex without intravenous contrast. COMPARISON:  None. FINDINGS: Evaluation of this exam is limited due to motion artifact. Brain: The ventricles and sulci appropriate size for patient's age. The gray-white matter discrimination is preserved. There is no acute intracranial hemorrhage. No mass effect or midline shift. No extra-axial fluid collection. Vascular: No hyperdense vessel or unexpected calcification. Skull: Normal. Negative for fracture or focal lesion. Sinuses/Orbits: No acute finding. Other: None IMPRESSION: No acute intracranial pathology. Electronically Signed   By: Anner Crete M.D.   On: 10/25/2020 20:23   DG Chest Port 1 View  Result Date: 10/27/2020 CLINICAL DATA:  Cough, shortness of breath, COVID positive. EXAM: PORTABLE CHEST 1 VIEW COMPARISON:  Chest x-ray dated 10/25/2020. FINDINGS: Stable cardiomegaly. Patchy bilateral airspace opacities are stable, compatible with multifocal pneumonia superimposed on chronic interstitial lung disease/emphysema. No pleural effusion or  pneumothorax is seen. Osseous structures about the chest are unremarkable. IMPRESSION: 1. Stable multifocal pneumonia superimposed on chronic interstitial lung disease/emphysema. 2. Stable cardiomegaly. Electronically Signed   By: Franki Cabot M.D.   On: 10/27/2020 08:38   DG Chest Port 1 View  Result Date: 10/25/2020 CLINICAL DATA:  Altered mental status.  COVID positive. EXAM: PORTABLE CHEST 1 VIEW COMPARISON:  09/14/2018 FINDINGS: Symmetric low lung volumes. Patchy and heterogeneous bilateral lung opacities. Upper normal heart size with unchanged mediastinal contours. There may be a vascular congestion. No significant pleural effusion. No pneumothorax. No acute osseous abnormalities are seen. IMPRESSION: 1. Patchy and heterogeneous bilateral lung opacities, pattern typical of COVID-19 pneumonia. 2. Possible vascular congestion. Electronically Signed   By: Keith Rake M.D.   On: 10/25/2020 20:15

## 2020-10-29 NOTE — Progress Notes (Signed)
Patient's temperature was taken by nurse tech and nurse from 4 separate devices, readings from thermometers did not exceed 95 degrees Farenheit. Patient educated by nurse that these reading are low and a rectal temperature should be obtained to confirm reading. Patient stated "wow, you must really want a revolution", nurse interpreted that the patient meant 'no' by body language and facial expression. Nurse attempted again to obtain 2 axillary temperatures and an oral temperature which resulted again in temperatures below 95 degrees. Nurse attempted once more to educate and convince patient that a low temperature such as this could be dangerous if it is a true reading. Patient responded by loudly playing song with the word 'revolution' repeated and did not respond to nurse verbally. Nurse interpreted the patients response as a 'no'. MD messaged, Nurse will continue to monitor.

## 2020-10-29 NOTE — Progress Notes (Signed)
Patient refusing to wear telemetry leads or SPO2 monitor. Patient educated on importance of monitoring heart rate and oxygen. Patient stated "The alarms are keeping me up at night, I don't want to wear them... The wires stop me from getting up, get technology that's not from the 80's". Nurse messaged the MD about patients refusal. MD called to confirm and speak with patient and give further education in hopes for patient to reconsider, patient reiterated previously mentioned complaints. Patient still refusing telemetry and SPO2 monitoring. Nurse will continue to monitor, patient A&Ox4 currently.

## 2020-10-29 NOTE — Discharge Instructions (Signed)
Follow with Primary MD in 7 days   Get CBC, CMP, TSH, D Dimer, 2 view Chest X ray -  checked next visit within 1 week by Primary MD   Activity: As tolerated with Full fall precautions use walker/cane & assistance as needed  Disposition Home  Diet: Renal with 1.5 lit/day fluid restriction.  Special Instructions: If you have smoked or chewed Tobacco  in the last 2 yrs please stop smoking, stop any regular Alcohol  and or any Recreational drug use.  On your next visit with your primary care physician please Get Medicines reviewed and adjusted.  Please request your Prim.MD to go over all Hospital Tests and Procedure/Radiological results at the follow up, please get all Hospital records sent to your Prim MD by signing hospital release before you go home.  If you experience worsening of your admission symptoms, develop shortness of breath, life threatening emergency, suicidal or homicidal thoughts you must seek medical attention immediately by calling 911 or calling your MD immediately  if symptoms less severe.  You Must read complete instructions/literature along with all the possible adverse reactions/side effects for all the Medicines you take and that have been prescribed to you. Take any new Medicines after you have completely understood and accpet all the possible adverse reactions/side effects.        Person Under Monitoring Name: [name] [name]  Location: [address] [place] [address]   Infection Prevention Recommendations for Individuals Confirmed to have, or Being Evaluated for, 2019 Novel Coronavirus (COVID-19) Infection Who Receive Care at Home  Individuals who are confirmed to have, or are being evaluated for, COVID-19 should follow the prevention steps below until a healthcare provider or local or state health department says they can return to normal activities.  Stay home except to get medical care You should restrict activities outside your home, except for  getting medical care. Do not go to work, school, or public areas, and do not use public transportation or taxis.  Call ahead before visiting your doctor Before your medical appointment, call the healthcare provider and tell them that you have, or are being evaluated for, COVID-19 infection. This will help the healthcare providers office take steps to keep other people from getting infected. Ask your healthcare provider to call the local or state health department.  Monitor your symptoms Seek prompt medical attention if your illness is worsening (e.g., difficulty breathing). Before going to your medical appointment, call the healthcare provider and tell them that you have, or are being evaluated for, COVID-19 infection. Ask your healthcare provider to call the local or state health department.  Wear a facemask You should wear a facemask that covers your nose and mouth when you are in the same room with other people and when you visit a healthcare provider. People who live with or visit you should also wear a facemask while they are in the same room with you.  Separate yourself from other people in your home As much as possible, you should stay in a different room from other people in your home. Also, you should use a separate bathroom, if available.  Avoid sharing household items You should not share dishes, drinking glasses, cups, eating utensils, towels, bedding, or other items with other people in your home. After using these items, you should wash them thoroughly with soap and water.  Cover your coughs and sneezes Cover your mouth and nose with a tissue when you cough or sneeze, or you can cough or sneeze  into your sleeve. Throw used tissues in a lined trash can, and immediately wash your hands with soap and water for at least 20 seconds or use an alcohol-based hand rub.  Wash your [facility] your hands often and thoroughly with soap and water for at least 20 seconds. You can use  an alcohol-based hand sanitizer if soap and water are not available and if your hands are not visibly dirty. Avoid touching your eyes, nose, and mouth with unwashed hands.   Prevention Steps for Caregivers and Household Members of Individuals Confirmed to have, or Being Evaluated for, COVID-19 Infection Being Cared for in the Home  If you live with, or provide care at home for, a person confirmed to have, or being evaluated for, COVID-19 infection please follow these guidelines to prevent infection:  Follow healthcare providers instructions Make sure that you understand and can help the patient follow any healthcare provider instructions for all care.  Provide for the patients basic needs You should help the patient with basic needs in the home and provide support for getting groceries, prescriptions, and other personal needs.  Monitor the patients symptoms If they are getting sicker, call his or her medical provider and tell them that the patient has, or is being evaluated for, COVID-19 infection. This will help the healthcare providers office take steps to keep other people from getting infected. Ask the healthcare provider to call the local or state health department.  Limit the number of people who have contact with the patient  If possible, have only one caregiver for the patient.  Other household members should stay in another home or place of residence. If this is not possible, they should stay  in another room, or be separated from the patient as much as possible. Use a separate bathroom, if available.  Restrict visitors who do not have an essential need to be in the home.  Keep older adults, very young children, and other sick people away from the patient Keep older adults, very young children, and those who have compromised immune systems or chronic health conditions away from the patient. This includes people with chronic heart, lung, or kidney conditions, diabetes,  and cancer.  Ensure good ventilation Make sure that shared spaces in the home have good air flow, such as from an air conditioner or an opened window, weather permitting.  Wash your hands often  Wash your hands often and thoroughly with soap and water for at least 20 seconds. You can use an alcohol based hand sanitizer if soap and water are not available and if your hands are not visibly dirty.  Avoid touching your eyes, nose, and mouth with unwashed hands.  Use disposable paper towels to dry your hands. If not available, use dedicated cloth towels and replace them when they become wet.  Wear a facemask and gloves  Wear a disposable facemask at all times in the room and gloves when you touch or have contact with the patients blood, body fluids, and/or secretions or excretions, such as sweat, saliva, sputum, nasal mucus, vomit, urine, or feces.  Ensure the mask fits over your nose and mouth tightly, and do not touch it during use.  Throw out disposable facemasks and gloves after using them. Do not reuse.  Wash your hands immediately after removing your facemask and gloves.  If your personal clothing becomes contaminated, carefully remove clothing and launder. Wash your hands after handling contaminated clothing.  Place all used disposable facemasks, gloves, and other waste in  a lined container before disposing them with other household waste.  Remove gloves and wash your hands immediately after handling these items.  Do not share dishes, glasses, or other household items with the patient  Avoid sharing household items. You should not share dishes, drinking glasses, cups, eating utensils, towels, bedding, or other items with a patient who is confirmed to have, or being evaluated for, COVID-19 infection.  After the person uses these items, you should wash them thoroughly with soap and water.  Wash laundry thoroughly  Immediately remove and wash clothes or bedding that have blood,  body fluids, and/or secretions or excretions, such as sweat, saliva, sputum, nasal mucus, vomit, urine, or feces, on them.  Wear gloves when handling laundry from the patient.  Read and follow directions on labels of laundry or clothing items and detergent. In general, wash and dry with the warmest temperatures recommended on the label.  Clean all areas the individual has used often  Clean all touchable surfaces, such as counters, tabletops, doorknobs, bathroom fixtures, toilets, phones, keyboards, tablets, and bedside tables, every day. Also, clean any surfaces that may have blood, body fluids, and/or secretions or excretions on them.  Wear gloves when cleaning surfaces the patient has come in contact with.  Use a diluted bleach solution (e.g., dilute bleach with 1 part bleach and 10 parts water) or a household disinfectant with a label that says EPA-registered for coronaviruses. To make a bleach solution at home, add 1 tablespoon of bleach to 1 quart (4 cups) of water. For a larger supply, add  cup of bleach to 1 gallon (16 cups) of water.  Read labels of cleaning products and follow recommendations provided on product labels. Labels contain instructions for safe and effective use of the cleaning product including precautions you should take when applying the product, such as wearing gloves or eye protection and making sure you have good ventilation during use of the product.  Remove gloves and wash hands immediately after cleaning.  Monitor yourself for signs and symptoms of illness Caregivers and household members are considered close contacts, should monitor their health, and will be asked to limit movement outside of the home to the extent possible. Follow the monitoring steps for close contacts listed on the symptom monitoring form.   ? If you have additional questions, contact your local health department or call the epidemiologist on call at [phone] (available 24/7). ? This  guidance is subject to change. For the most up-to-date guidance from [facility], please refer to their website: [email or web address]

## 2020-10-29 NOTE — Progress Notes (Signed)
Renal Navigator spoke with patient's outpatient [facility] clinic[facility] who informed Navigator that patient tested positive for COVID on 10/21/20. 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   Alphonzo Cruise, [facility] Renal Navigator [phone]

## 2020-10-29 NOTE — Progress Notes (Signed)
OT Cancellation Note  Patient Details Name: [name] [name] MRN: [id number] DOB: [birth year]   Cancelled Treatment:    Reason Eval/Treat Not Completed: Patient declined, no reason specified; pt declined participation in OT eval. Notably frustrated/agitated and reports only wanting to discharge. Of note pt stood upon OT's initial arrival with no significant difficulty, dressed and sitting in straight back chair. Suspect pt mobilizing fairly well, but will follow up for OT eval as able.  Lou Cal, OT Acute Rehabilitation Services Pager [phone] Office [phone]   Raymondo Band 10/29/2020, 2:06 PM

## 2020-10-29 NOTE — Progress Notes (Signed)
Patient left AMA. Patient states that he is well aware of his decision against the MD advice. AMA paper work place on patient chart.

## 2020-10-29 NOTE — Progress Notes (Signed)
Lower extremity venous has been completed.   Preliminary results in CV Proc.   [name] [name] 10/29/2020 3:22 PM

## 2020-10-29 NOTE — Progress Notes (Addendum)
PROGRESS NOTE                                                                                                                                                                                                             Patient Demographics:    [name] [name], is a 60 y.o. male, DOB - 12-Sep-1961, GNO:[id number]  Outpatient Primary MD for the patient is Patient, No Pcp Per    LOS - 3  Admit date - 10/25/2020    CC - confused     Brief Narrative (HPI from H&P) - [name] [name] is a 60 y.o. 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  History is obtained from the patient as well as family over the phone.  He had been having symptoms including cough and congestion over the last week and a half, he had missed 1 run of dialysis, was getting quite confused and family got concerned and brought him to the ER where he was diagnosed with COVID-19 pneumonia, metabolic encephalopathy admit to the hospital.   Subjective:   Patient in bed, appears comfortable, denies any headache, no fever, no chest pain or pressure, no shortness of breath , no abdominal pain. No focal weakness.    Assessment  & Plan :     1. Acute Covid 19 Viral Pneumonitis during the ongoing 2020 Covid 19 Pandemic - his immunization status is unknown but he states that he does not recall getting any vaccine, he is in for GERD mild to moderate parenchymal lung injury, has been started on steroids and Remdesivir x 3.  Minimal to no hypoxia, monitor closely  Encouraged the patient to sit up in chair in the daytime use I-S and flutter valve for pulmonary toiletry and then prone in bed when at night.  Will advance activity and titrate down oxygen as possible.   SpO2: 99 % O2 Flow Rate (L/min): 1 L/min  Recent Labs  Lab 10/25/20 1956  10/25/20 2050 10/25/20 2113 10/26/20 0341 10/27/20 0356 10/28/20 0200 10/29/20 0224 10/29/20 0309  WBC 5.0  --   --  3.2* 6.3 7.3 7.5  --   HGB 16.4  --   --  15.6 16.5 16.0 14.5  --  HCT 48.4  --   --  44.5 46.8 47.6 41.4  --   PLT 108*  --   --  83* 87* 83* 76*  --   CRP  --   --   --  3.7* 3.4* 3.2* 2.1*  --   BNP  --   --   --  >4,500.0* >4,500.0* >4,500.0* >4,500.0*  --   DDIMER  --   --   --  2.12* 2.01* 2.13*  --  3.38*  PROCALCITON  --   --   --  14.13  --   --   --   --   AST 276*  --   --  222* 165* 194* 194*  --   ALT 254*  --   --  218* 189* 214* 226*  --   ALKPHOS 74  --   --  71 74 84 76  --   BILITOT 1.4*  --   --  1.6* 1.2 0.8 0.9  --   ALBUMIN 4.2  --   --  3.4* 3.2* 3.2* 3.1*  --   INR  --   --  1.3*  --   --  1.4*  --  1.3*  SARSCOV2NAA  --  POSITIVE*  --   --   --   --   --   --     2.  Acute metabolic encephalopathy.  Dialyze, treat COVID-19 and monitor.  No focal deficits with stable head CT.  Mentation is improving and now at baseline per his sister who talked with him.   3.  Mild asymptomatic transaminitis.  Likely due to viral infection and Remdesivir.  Monitor trend and INR.  4.  ESRD.  TTS schedule.  Renal on board.  5.  GERD.  On Pepcid.  6.  Mildly elevated D-dimer.  On heparin prophylactic dose, pending leg venous ultrasound.    7. Mild viral infection induced acute on chronic thrombocytopenia (150s) .  Monitor.   Lab Results  Component Value Date   PLT 76 (L) 10/29/2020        Condition - Extremely Guarded  Family Communication  : Updated Sister Threasa Beards [phone] on 10/28/2020, 10/29/20  Code Status :  Full  Consults  :  Renal  Procedures  :   Leg Venous Ultrasound -     CT head.  Nonacute  PUD Prophylaxis : None  Disposition Plan  :    Status is: Inpatient  Remains inpatient appropriate because:IV treatments appropriate due to intensity of illness or inability to take PO   Dispo: The patient is from: Home               Anticipated d/c is to: Home              Anticipated d/c date is: 3 days              Patient currently is not medically stable to d/c.  DVT Prophylaxis  :   Heparin    Lab Results  Component Value Date   PLT 76 (L) 10/29/2020    Diet :  Diet Order            Diet renal with fluid restriction Fluid restriction: 1200 mL Fluid; Room service appropriate? Yes; Fluid consistency: Thin  Diet effective now                  Inpatient Medications  Scheduled Meds: . vitamin C  500 mg Oral Daily  .  Chlorhexidine Gluconate Cloth  6 each Topical Q0600  . cinacalcet  30 mg Oral QPM  . doxercalciferol  3 mcg Intravenous Q M,W,F-HD  . famotidine  10 mg Oral Daily  . feeding supplement (NEPRO CARB STEADY)  237 mL Oral BID BM  . ferric citrate  630 mg Oral TID WC  . heparin  5,000 Units Subcutaneous Q8H  . mouth rinse  15 mL Mouth Rinse BID  . methylPREDNISolone (SOLU-MEDROL) injection  40 mg Intravenous Daily  . sodium chloride flush  3 mL Intravenous Q12H  . zinc sulfate  220 mg Oral Daily   Continuous Infusions: . cefTRIAXone (ROCEPHIN)  IV Stopped (10/28/20 1551)   PRN Meds:.acetaminophen, albuterol, chlorpheniramine-HYDROcodone, fluticasone, guaiFENesin-dextromethorphan, hydrocortisone cream  Antibiotics  :    Anti-infectives (From admission, onward)   Start     Dose/Rate Route Frequency Ordered Stop   10/26/20 1530  cefTRIAXone (ROCEPHIN) 2 g in sodium chloride 0.9 % 100 mL IVPB        2 g 200 mL/hr over 30 Minutes Intravenous Every 24 hours 10/26/20 1442 10/31/20 1559   10/26/20 1000  remdesivir 100 mg in sodium chloride 0.9 % 100 mL IVPB  Status:  Discontinued        100 mg 200 mL/hr over 30 Minutes Intravenous Daily 10/25/20 2232 10/28/20 0936   10/25/20 2300  remdesivir 100 mg in sodium chloride 0.9 % 100 mL IVPB        100 mg 200 mL/hr over 30 Minutes Intravenous Every 30 min 10/25/20 2232 10/26/20 0006       Time Spent in minutes  30   Lala Lund M.D on  10/29/2020 at 9:01 AM  To page go to [email or web address]   [facility] -  Office  [phone]    See all Orders from today for further details    Objective:   Vitals:   10/28/20 1330 10/28/20 1345 10/28/20 2059 10/29/20 0437  BP: 113/77 113/77 116/89 118/86  Pulse: 74 70 77 74  Resp: 18  19 20   Temp:  98.4 F (36.9 C)  (!) 95 F (35 C)  TempSrc:  Axillary  Axillary  SpO2: 98%  98% 99%  Weight: 80.4 kg     Height:        Wt Readings from Last 3 Encounters:  10/28/20 80.4 kg  11/27/19 92.5 kg  09/18/19 91.6 kg     Intake/Output Summary (Last 24 hours) at 10/29/2020 0901 Last data filed at 10/29/2020 0511 Gross per 24 hour  Intake 120 ml  Output 2500 ml  Net -2380 ml     Physical Exam  Awake Alert, No new F.N deficits, Normal affect [place].AT,PERRAL Supple Neck,No JVD, No cervical lymphadenopathy appriciated.  Symmetrical Chest wall movement, Good air movement bilaterally, CTAB RRR,No Gallops, Rubs or new Murmurs, No Parasternal Heave +ve B.Sounds, Abd Soft, No tenderness, No organomegaly appriciated, No rebound - guarding or rigidity. No Cyanosis, Clubbing or edema, No new Weatherwax or bruise    Data Review:    CBC Recent Labs  Lab 10/25/20 1956 10/26/20 0341 10/27/20 0356 10/28/20 0200 10/29/20 0224  WBC 5.0 3.2* 6.3 7.3 7.5  HGB 16.4 15.6 16.5 16.0 14.5  HCT 48.4 44.5 46.8 47.6 41.4  PLT 108* 83* 87* 83* 76*  MCV 95.1 92.7 91.9 93.0 92.2  MCH 32.2 32.5 32.4 31.3 32.3  MCHC 33.9 35.1 35.3 33.6 35.0  RDW 14.2 14.1 14.1 14.0 14.4  LYMPHSABS 0.9  --  0.6* 0.5* 0.3*  MONOABS 0.5  --  0.3 0.3 0.4  EOSABS 0.0  --  0.0 0.0 0.0  BASOSABS 0.0  --  0.0 0.0 0.0    Recent Labs  Lab 10/25/20 1956 10/25/20 2113 10/26/20 0341 10/26/20 1527 10/27/20 0356 10/28/20 0200 10/29/20 0224 10/29/20 0309 10/29/20 0657  NA 137  --  138  --  139 137 138  --   --   K 5.2*  --  6.0*  --  5.7* 5.4* 5.0  --   --   CL 87*  --  89*  --  92* 91* 96*  --   --   CO2 26   --  24  --  26 24 24   --   --   GLUCOSE 119*  --  137*  --  168* 169* 170*  --   --   BUN 72*  --  75*  --  72* 110* 84*  --   --   CREATININE 16.69*  --  17.81*  --  14.10* 15.81* 11.79*  --   --   CALCIUM 8.8*  --  8.7*  --  7.9* 7.8* 7.7*  --   --   AST 276*  --  222*  --  165* 194* 194*  --   --   ALT 254*  --  218*  --  189* 214* 226*  --   --   ALKPHOS 74  --  71  --  74 84 76  --   --   BILITOT 1.4*  --  1.6*  --  1.2 0.8 0.9  --   --   ALBUMIN 4.2  --  3.4*  --  3.2* 3.2* 3.1*  --   --   MG 2.1  --   --   --  2.2 2.6* 2.6*  --   --   CRP  --   --  3.7*  --  3.4* 3.2* 2.1*  --   --   DDIMER  --   --  2.12*  --  2.01* 2.13*  --  3.38*  --   PROCALCITON  --   --  14.13  --   --   --   --   --   --   INR  --  1.3*  --   --   --  1.4*  --  1.3*  --   TSH  --   --   --  0.276*  --   --   --   --  0.265*  AMMONIA  --  19  --   --   --   --   --   --   --   BNP  --   --  >4,500.0*  --  >4,500.0* >4,500.0* >4,500.0*  --   --     ------------------------------------------------------------------------------------------------------------------ No results for input(s): CHOL, HDL, LDLCALC, TRIG, CHOLHDL, LDLDIRECT in the last 72 hours.  Lab Results  Component Value Date   HGBA1C 5.5 04/09/2014   ------------------------------------------------------------------------------------------------------------------ Recent Labs    10/29/20 0657  TSH 0.265*    Cardiac Enzymes No results for input(s): CKMB, TROPONINI, MYOGLOBIN in the last 168 hours.  Invalid input(s): CK ------------------------------------------------------------------------------------------------------------------    Component Value Date/Time   BNP >4,500.0 (H) 10/29/2020 0224    Micro Results Recent Results (from the past 240 hour(s))  Resp Panel by RT-PCR (Flu A&B, Covid) Nasopharyngeal Swab     Status: Abnormal   Collection Time: 10/25/20  8:50 PM   Specimen: Nasopharyngeal Swab;  Nasopharyngeal(NP) swabs in  vial transport medium  Result Value Ref Range Status   SARS Coronavirus 2 by RT PCR POSITIVE (A) NEGATIVE Final    Comment: RESULT CALLED TO, READ BACK BY AND VERIFIED WITH: Jaci Carrel RN AT 2155 ON 10/25/20 BY I.SUGUT (NOTE) SARS-CoV-2 target nucleic acids are DETECTED.  The SARS-CoV-2 RNA is generally detectable in upper respiratory specimens during the acute phase of infection. Positive results are indicative of the presence of the identified virus, but do not rule out bacterial infection or co-infection with other pathogens not detected by the test. Clinical correlation with patient history and other diagnostic information is necessary to determine patient infection status. The expected result is Negative.  Fact Sheet for Patients: EntrepreneurPulse.com.au  Fact Sheet for Healthcare Providers: IncredibleEmployment.be  This test is not yet approved or cleared by the [place] FDA and  has been authorized for detection and/or diagnosis of SARS-CoV-2 by FDA under an Emergency Use Authorization (EUA).  This EUA will remain in effect (meaning th is test can be used) for the duration of  the COVID-19 declaration under Section 564(b)(1) of the Act, 21 U.S.C. section 360bbb-3(b)(1), unless the authorization is terminated or revoked sooner.     Influenza A by PCR NEGATIVE NEGATIVE Final   Influenza B by PCR NEGATIVE NEGATIVE Final    Comment: (NOTE) The Xpert Xpress SARS-CoV-2/FLU/RSV plus assay is intended as an aid in the diagnosis of influenza from Nasopharyngeal swab specimens and should not be used as a sole basis for treatment. Nasal washings and aspirates are unacceptable for Xpert Xpress SARS-CoV-2/FLU/RSV testing.  Fact Sheet for Patients: EntrepreneurPulse.com.au  Fact Sheet for Healthcare Providers: IncredibleEmployment.be  This test is not yet approved or cleared by the [place] FDA  and has been authorized for detection and/or diagnosis of SARS-CoV-2 by FDA under an Emergency Use Authorization (EUA). This EUA will remain in effect (meaning this test can be used) for the duration of the COVID-19 declaration under Section 564(b)(1) of the Act, 21 U.S.C. section 360bbb-3(b)(1), unless the authorization is terminated or revoked.  Performed at [facility], [address] [place], [place] [address]   MRSA PCR Screening     Status: None   Collection Time: 10/26/20  6:50 AM   Specimen: Nasal Mucosa; Nasopharyngeal  Result Value Ref Range Status   MRSA by PCR NEGATIVE NEGATIVE Final    Comment:        The GeneXpert MRSA Assay (FDA approved for NASAL specimens only), is one component of a comprehensive MRSA colonization surveillance program. It is not intended to diagnose MRSA infection nor to guide or monitor treatment for MRSA infections. Performed at [facility], [address], [place], [place] [address]   Culture, blood (routine x 2) Call MD if unable to obtain prior to antibiotics being given     Status: None (Preliminary result)   Collection Time: 10/27/20  3:55 AM   Specimen: BLOOD  Result Value Ref Range Status   Specimen Description BLOOD RIGHT ANTECUBITAL  Final   Special Requests   Final    BOTTLES DRAWN AEROBIC AND ANAEROBIC Blood Culture adequate volume   Culture   Final    NO GROWTH 2 DAYS Performed at [facility], [address] [place], [place] [address]    Report Status PENDING  Incomplete  Culture, blood (routine x 2) Call MD if unable to obtain prior to antibiotics being given     Status: None (Preliminary  result)   Collection Time: 10/27/20  3:56 AM   Specimen: BLOOD RIGHT HAND  Result Value Ref Range Status   Specimen Description BLOOD RIGHT HAND  Final   Special Requests   Final    BOTTLES DRAWN AEROBIC AND ANAEROBIC Blood Culture adequate volume   Culture   Final    NO GROWTH 2 DAYS Performed at [facility], [address], [place], [place] [address]    Report Status PENDING  Incomplete    Radiology Reports CT Head Wo Contrast  Result Date: 10/25/2020 CLINICAL DATA:  60 year old male with altered mental status. EXAM: CT HEAD WITHOUT CONTRAST TECHNIQUE: Contiguous axial images were obtained from the base of the skull through the vertex without intravenous contrast. COMPARISON:  None. FINDINGS: Evaluation of this exam is limited due to motion artifact. Brain: The ventricles and sulci appropriate size for patient's age. The gray-white matter discrimination is preserved. There is no acute intracranial hemorrhage. No mass effect or midline shift. No extra-axial fluid collection. Vascular: No hyperdense vessel or unexpected calcification. Skull: Normal. Negative for fracture or focal lesion. Sinuses/Orbits: No acute finding. Other: None IMPRESSION: No acute intracranial pathology. Electronically Signed   By: Anner Crete M.D.   On: 10/25/2020 20:23   DG Chest Port 1 View  Result Date: 10/27/2020 CLINICAL DATA:  Cough, shortness of breath, COVID positive. EXAM: PORTABLE CHEST 1 VIEW COMPARISON:  Chest x-ray dated 10/25/2020. FINDINGS: Stable cardiomegaly. Patchy bilateral airspace opacities are stable, compatible with multifocal pneumonia superimposed on chronic interstitial lung disease/emphysema. No pleural effusion or pneumothorax is seen. Osseous structures about the chest are unremarkable. IMPRESSION: 1. Stable multifocal pneumonia superimposed on chronic interstitial lung disease/emphysema. 2. Stable cardiomegaly. Electronically Signed   By: Franki Cabot M.D.   On: 10/27/2020 08:38   DG Chest Port 1 View  Result Date: 10/25/2020 CLINICAL DATA:  Altered mental status.  COVID positive. EXAM: PORTABLE CHEST 1 VIEW COMPARISON:  09/14/2018 FINDINGS: Symmetric low lung volumes. Patchy and heterogeneous bilateral lung opacities. Upper normal heart size with unchanged mediastinal contours. There  may be a vascular congestion. No significant pleural effusion. No pneumothorax. No acute osseous abnormalities are seen. IMPRESSION: 1. Patchy and heterogeneous bilateral lung opacities, pattern typical of COVID-19 pneumonia. 2. Possible vascular congestion. Electronically Signed   By: Keith Rake M.D.   On: 10/25/2020 20:15

## 2020-10-30 ENCOUNTER — Telehealth: Payer: Self-pay | Admitting: Nephrology

## 2020-10-30 NOTE — Telephone Encounter (Signed)
Transition of Care Contact from [place]   Date of Discharge: 10/29/20 Date of Contact: 10/30/20 Method of contact: phone Talked to patient family   Patient contacted to discuss transition of care form recent hospitaliztion. Patient was admitted to [facility] from 10/26/20 to 10/29/20 with the discharge diagnosis of covid pneumonia.     Medication changes were reviewed.    Patient will follow up with is outpatient dialysis center 10/30/20.   Other follow up needs include none identified.   Jen Mow, PA-C [facility] Pager: [phone]

## 2020-11-01 LAB — CULTURE, BLOOD (ROUTINE X 2)
Culture: NO GROWTH
Culture: NO GROWTH
Special Requests: ADEQUATE
Special Requests: ADEQUATE

## 2020-11-03 LAB — CULTURE, BLOOD (ROUTINE X 2)
Culture: NO GROWTH
Culture: NO GROWTH

## 2020-11-06 IMAGING — CT CT ABD-PEL WO/W CM
3 of 7 series · 12 of 32 positions shown, 17 images · IV contrast (APPLIED)
Comparison: 08/12/2018 CT abdomen/pelvis.

CLINICAL DATA: Polycystic kidney disease. End-stage renal disease
on dialysis. Former smoker.

EXAM:
CT ABDOMEN AND PELVIS WITHOUT AND WITH CONTRAST
TECHNIQUE: Multidetector CT imaging of the abdomen and pelvis was performed
following the standard protocol before and following the bolus
administration of intravenous contrast.
CONTRAST:  100mL DDVRB0-EUU IOPAMIDOL (DDVRB0-EUU) INJECTION 61%

[Series 2: abd w/(date) · axial · 0.88mm/px · z∈[-275,-161]mm · 3 of 95 slices shown]
[im 19/95  soft-tissue]
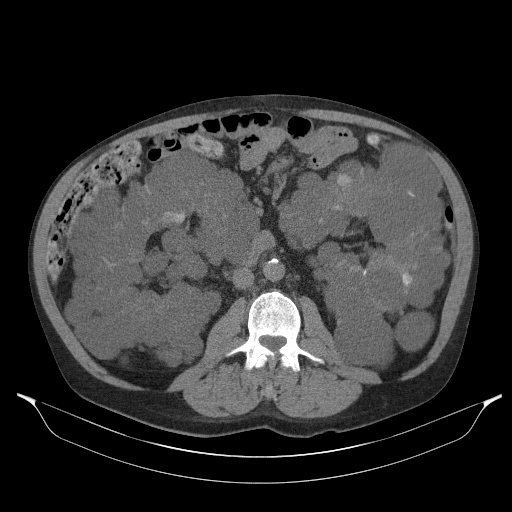
[im 38/95  soft-tissue]
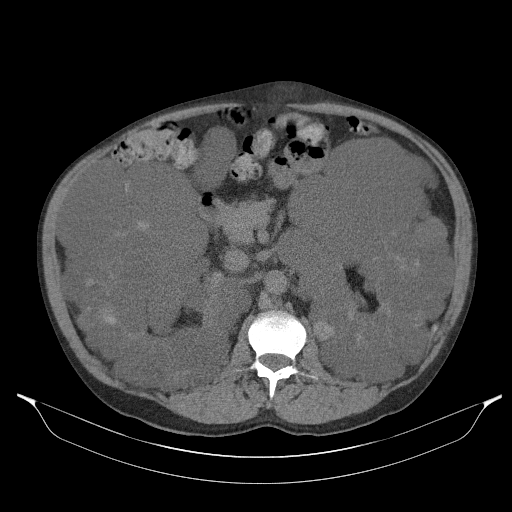
[im 57/95  soft-tissue]
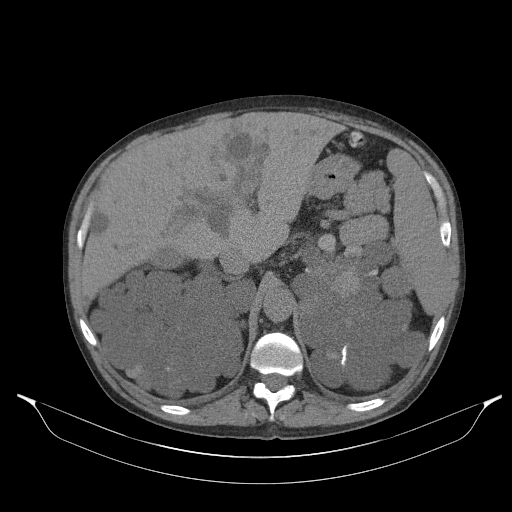

[Series 5: nephrographic · axial · 0.88mm/px · z∈[-482,-122]mm · 6 of 170 slices shown, 11 images]
[im 25/170  soft-tissue]
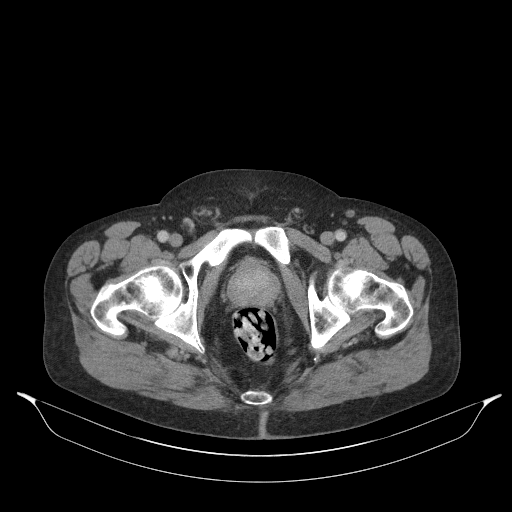
[im 25/170  bone]
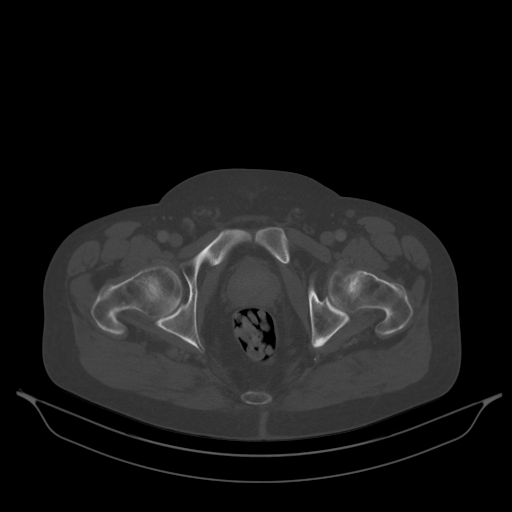
[im 49/170  soft-tissue]
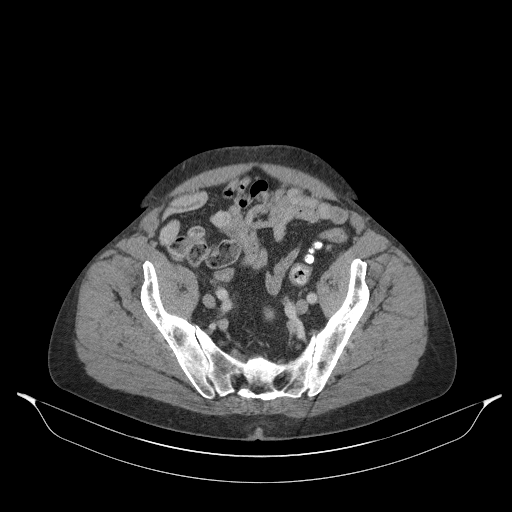
[im 73/170  soft-tissue]
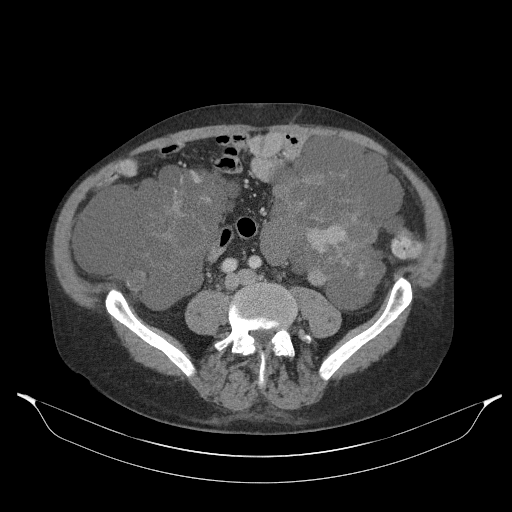
[im 73/170  lung]
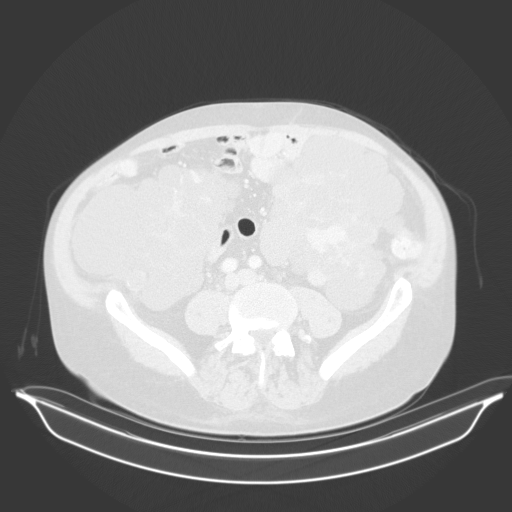
[im 97/170  soft-tissue]
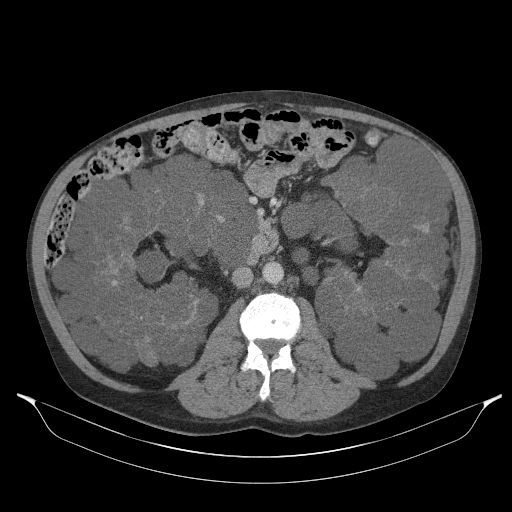
[im 97/170  lung]
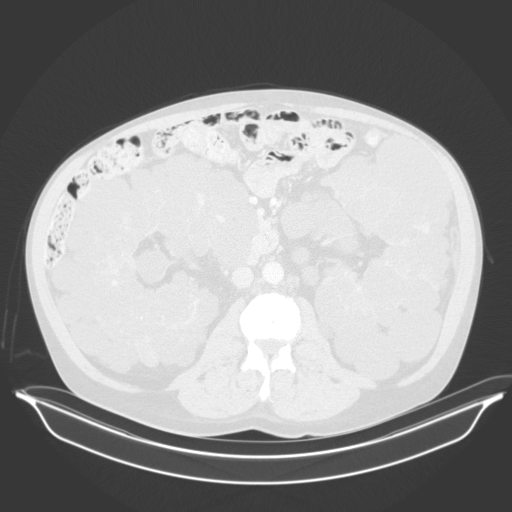
[im 121/170  soft-tissue]
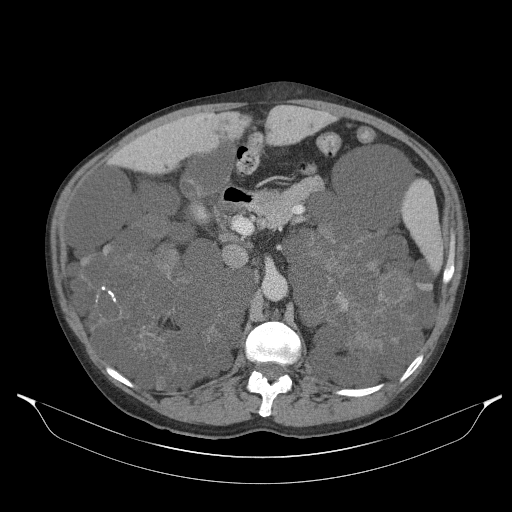
[im 121/170  lung]
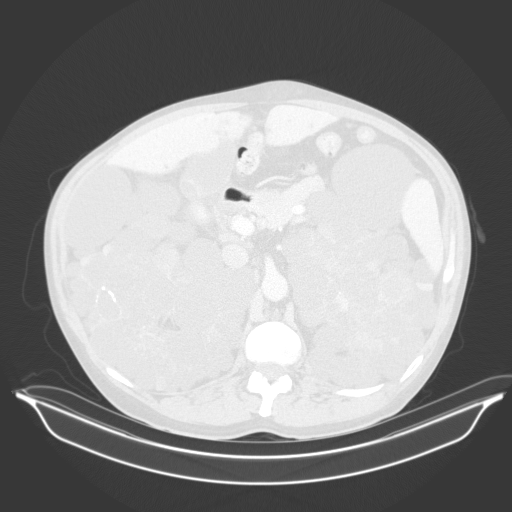
[im 145/170  soft-tissue]
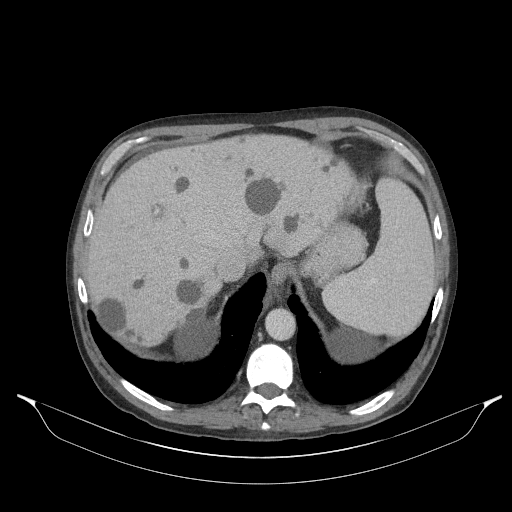
[im 145/170  lung]
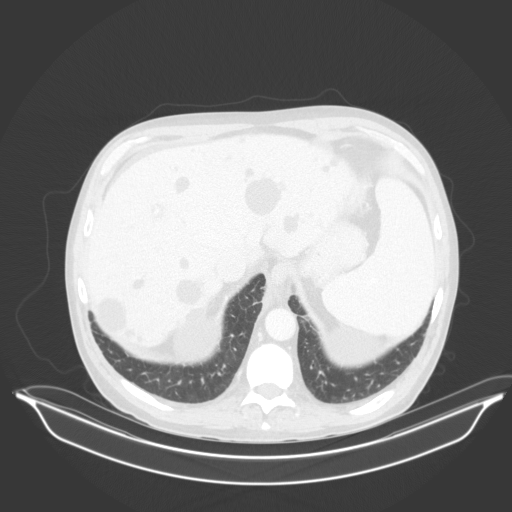

[Series 7: renal delay · axial · delayed · 0.88mm/px · z∈[-342,-189]mm · 3 of 103 slices shown]
[im 26/103  soft-tissue]
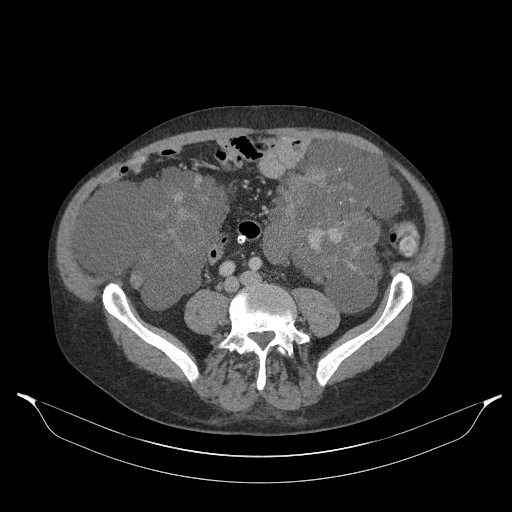
[im 52/103  soft-tissue]
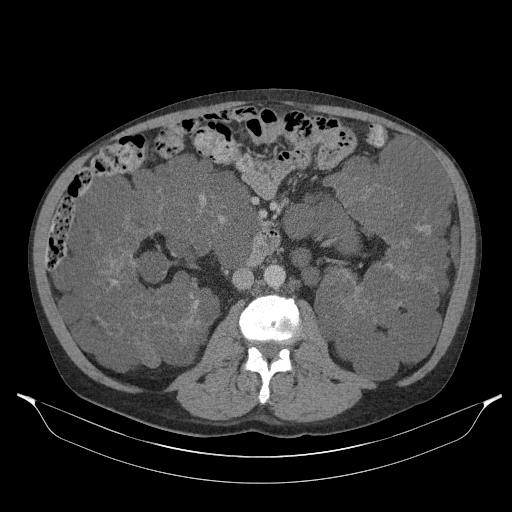
[im 77/103  soft-tissue]
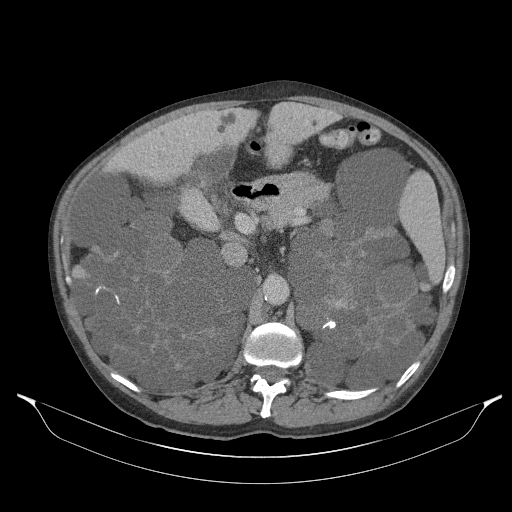

[12 of 32 positions shown; findings below may reference images not displayed]

FINDINGS: Lower chest: No significant pulmonary nodules or acute consolidative
airspace disease.

Hepatobiliary: Normal liver size. Numerous simple liver cysts
scattered throughout the liver, largest 3.2 cm in the segment 7
right liver. Peripherally coarsely calcified subcentimeter right
liver dome cyst (series 3/image 13) is stable size and appreciable
enhancement, considered benign. Innumerable subcentimeter hypodense
liver lesions scattered throughout the liver are too small to
characterize. No overtly suspicious appreciably enhancing liver
masses. Normal gallbladder with no radiopaque cholelithiasis. No
biliary ductal dilatation.

Pancreas: Normal, with no mass or duct dilation.

Spleen: Normal size. No mass.

Adrenals/Urinary Tract: Normal adrenals. Markedly enlarged
polycystic kidneys bilaterally. Most of the renal cysts are simple,
for example measuring up to 7.8 cm in the upper left kidney (series
3/image 32). Several of the renal cysts are minimally complex with
mural calcification, largest 6.4 cm in the upper right kidney
(series 3/image 34). Numerous renal cysts in both kidneys
demonstrate precontrast hyperdensity, for example measuring up to
4.7 in the anterior upper left kidney (series 2/image 40) and 2.4 cm
in upper right kidney (series 2/image 54), none of which demonstrate
convincing enhancement. A few of the hyperdense renal cortical
lesions in the far lower kidneys bilaterally were not included on
the precontrast sequence, including a peripherally hyperdense 3.1 cm
lower left renal cortical lesion (series 7/image 80), which are
incompletely evaluated, however are unchanged in size since
08/12/2018 CT, favoring benign complex renal cysts. No appreciable
enhancing suspicious renal masses. No hydronephrosis. Nondistended
bladder with stable chronic mild diffuse bladder wall thickening.

Stomach/Bowel: Normal non-distended stomach. Normal caliber small
bowel with no small bowel wall thickening. Appendix not discretely
visualized. Mild sigmoid diverticulosis. No large bowel wall
thickening or acute pericolonic fat stranding.

Vascular/Lymphatic: Atherosclerotic nonaneurysmal abdominal aorta.
Patent portal, splenic, hepatic and renal veins. No pathologically
enlarged lymph nodes in the abdomen or pelvis.

Reproductive: Mildly enlarged prostate.

Other: No pneumoperitoneum, ascites or focal fluid collection. Small
fat containing midline supraumbilical ventral abdominal hernia.

Musculoskeletal: No aggressive appearing focal osseous lesions.
IMPRESSION: 1. Enlarged polycystic kidneys. Numerous simple and complex renal
cysts without overtly suspicious renal lesions, with some mild
limitations as detailed above. Suggest continued imaging
surveillance in 6-12 months, with MRI or CT abdomen without and with
IV contrast.
2. Polycystic liver without suspicious liver masses.
3. Mild sigmoid diverticulosis.
4. Mildly enlarged prostate.
5. Small fat containing midline supraumbilical ventral abdominal
hernia.
6. Aortic Atherosclerosis ([id number]).

## 2020-11-30 ENCOUNTER — Emergency Department (HOSPITAL_COMMUNITY)
Admission: EM | Admit: 2020-11-30 | Discharge: 2020-12-01 | Disposition: A | Discharge status: Home / Self Care | Payer: Medicare (Managed Care) | Attending: Emergency Medicine | Admitting: Emergency Medicine

## 2020-11-30 ENCOUNTER — Other Ambulatory Visit: Payer: Self-pay

## 2020-11-30 ENCOUNTER — Encounter (HOSPITAL_COMMUNITY): Payer: Self-pay | Admitting: Emergency Medicine

## 2020-11-30 DIAGNOSIS — N186 End stage renal disease: Secondary | ICD-10-CM | POA: Insufficient documentation

## 2020-11-30 DIAGNOSIS — J449 Chronic obstructive pulmonary disease, unspecified: Secondary | ICD-10-CM | POA: Insufficient documentation

## 2020-11-30 DIAGNOSIS — Z87891 Personal history of nicotine dependence: Secondary | ICD-10-CM | POA: Insufficient documentation

## 2020-11-30 DIAGNOSIS — T8249XA Other complication of vascular dialysis catheter, initial encounter: Secondary | ICD-10-CM | POA: Diagnosis not present

## 2020-11-30 DIAGNOSIS — T829XXA Unspecified complication of cardiac and vascular prosthetic device, implant and graft, initial encounter: Secondary | ICD-10-CM

## 2020-11-30 DIAGNOSIS — Z7951 Long term (current) use of inhaled steroids: Secondary | ICD-10-CM | POA: Diagnosis not present

## 2020-11-30 DIAGNOSIS — Z8616 Personal history of COVID-19: Secondary | ICD-10-CM | POA: Insufficient documentation

## 2020-11-30 DIAGNOSIS — I120 Hypertensive chronic kidney disease with stage 5 chronic kidney disease or end stage renal disease: Secondary | ICD-10-CM | POA: Diagnosis not present

## 2020-11-30 DIAGNOSIS — Z992 Dependence on renal dialysis: Secondary | ICD-10-CM | POA: Diagnosis not present

## 2020-11-30 NOTE — ED Triage Notes (Signed)
Pt states his L fistula was bleeding after dialysis for approx 40 min today.  When he removed bandage he noticed 3 holes and believes there is a "weak" spot in fistula.

## 2020-12-01 NOTE — ED Provider Notes (Signed)
[facility] EMERGENCY DEPARTMENT Provider Note   CSN: [id number] Arrival date & time: 11/30/20  1647     History Chief Complaint  Patient presents with  . Vascular Access Problem    [name] [name] is a 60 y.o. male.  The history is provided by the patient.  Patient presents for complication of dialysis fistula.  Patient goes to dialysis 3 times a week.  He completed a full session earlier in the day.  However after removing the needle he bled for over 30 minutes.  The bleeding has since stopped, but he is concerned that his fistula is failing.  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  The fistula was first placed in 2015. He has no other acute complaints.  No fevers or vomiting.      Past Medical History:  Diagnosis Date  . Anemia   . Anxiety    situational  . DVT (deep venous thrombosis) (HCC)    leg  . ESRD (end stage renal disease) ([place])    TTHSAT [facility]  . GERD (gastroesophageal reflux disease)   . Hypertension   . Leucocytosis   . Polycystic kidney disease   . Shortness of breath     Patient Active Problem List   Diagnosis Date Noted  . Pneumonia due to COVID-19 virus 10/26/2020  . Transaminitis 10/26/2020  . Hyperbilirubinemia 10/26/2020  . Respiratory failure with hypoxia ([place]) 10/25/2020  . History of DVT (deep vein thrombosis) 05/16/2019  . CAP (community acquired pneumonia) 09/15/2018  . Sepsis ([place]) 09/14/2018  . End stage renal disease ([place]) 05/23/2014  . Metabolic acidosis 40/34/7425  . Essential hypertension, benign 04/04/2014  . Acute encephalopathy 04/04/2014  . Polycystic kidney disease 04/02/2014  . Acute renal failure ([place]) 04/02/2014    Past Surgical History:  Procedure Laterality Date  . AV FISTULA PLACEMENT Left 04/05/2014   Procedure: CREATION OF ARTERIOVENOUS (AV) FISTULA ;  Surgeon: Angelia Mould, MD;  Location: [facility];  Service: Vascular;  Laterality: Left;  . CENTRAL VENOUS CATHETER  INSERTION  04/02/2014   DR Titus Mould  . COLONOSCOPY    . FISTULA SUPERFICIALIZATION Left 06/23/6386   Procedure: PLICATION OF ANEURYSM ARTERIOVENOUS FISTULA LEFT ARM;  Surgeon: Waynetta Sandy, MD;  Location: [facility];  Service: Vascular;  Laterality: Left;  . INSERTION OF DIALYSIS CATHETER Right 04/05/2014   Procedure: ULTRASOUND GUIDED INSERTION OF DIALYSIS CATHETER;  Surgeon: Angelia Mould, MD;  Location: [facility];  Service: Vascular;  Laterality: Right;  . NOSE SURGERY    . REVISON OF ARTERIOVENOUS FISTULA Left 5/64/3329   Procedure: PLICATION OF LEFT ARM ARTERIOVENOUS FISTULA;  Surgeon: Waynetta Sandy, MD;  Location: [facility] OR;  Service: Vascular;  Laterality: Left;       Family History  Problem Relation Age of Onset  . Polycystic kidney disease Father   . Hypertension Father   . Polycystic kidney disease Sister     Social History   Tobacco Use  . Smoking status: Former Research scientist (life sciences)  . Smokeless tobacco: Never Used  . Tobacco comment: QUIT SMOKING CIGARETTES  IN 1994      QUIT SMOKING POT   Vaping Use  . Vaping Use: Never used  Substance Use Topics  . Alcohol use: No  . Drug use: No    Home Medications Prior to Admission medications   Medication Sig Start Date End Date Taking? Authorizing Provider  acetaminophen (TYLENOL) 500 MG tablet Take 500-1,000 mg by mouth every 6 (six) hours  as needed for moderate pain.    [provider]  cinacalcet (SENSIPAR) 30 MG tablet Take 30 mg by mouth every evening.     [provider]  diphenhydramine-acetaminophen (TYLENOL PM) 25-500 MG TABS tablet Take 1 tablet by mouth at bedtime as needed.    [provider]  ferric citrate (AURYXIA) 1 GM 210 MG(Fe) tablet Take 210-630 mg by mouth 3 (three) times daily with meals. Take 630 mg with each meal and 210 - 420 mg with each snack    [provider]  fluticasone (FLONASE) 50 MCG/ACT nasal spray Place 2 sprays into both nostrils daily as needed for  allergies. 12/15/17   [provider]  hydrocortisone cream 1 % Apply 1 application topically daily as needed for itching.    [provider]  multivitamin (RENA-VIT) TABS tablet Take 1 tablet by mouth every morning. Patient taking differently: Take 1 tablet by mouth every evening. 04/10/14   Ramiro Harvest, PA-C  pantoprazole (PROTONIX) 40 MG tablet Take 40 mg by mouth every evening.     [provider]    Allergies    Fentanyl and Vancomycin  Review of Systems   Review of Systems  Constitutional: Negative for fever.  Respiratory: Negative for shortness of breath.   Cardiovascular: Negative for chest pain.  Gastrointestinal: Negative for vomiting.  Skin: Positive for color change and wound.  All other systems reviewed and are negative.   Physical Exam Updated Vital Signs BP (!) 123/91 (BP Location: Right Arm)   Pulse 90   Temp 98 F (36.7 C) (Oral)   Resp 18   SpO2 100%   Physical Exam CONSTITUTIONAL: Well developed/well nourished HEAD: Normocephalic/atraumatic EYES: EOMI ENMT: Mucous membranes moist NECK: supple no meningeal signs CV: S1/S2 noted LUNGS: Lungs are clear to auscultation bilaterally, no apparent distress ABDOMEN: soft, nontender protuberant NEURO: Pt is awake/alert/appropriate, moves all extremitiesx4.  No facial droop.   EXTREMITIES: pulses normal/equal, full ROM, dialysis fistula noted to the left forearm.  There is a strong thrill noted throughout except for the discolored portion that is noted on the photo, the thrill is palpable but less so.  There is no active bleeding.  No discharge noted SKIN: warm, color normal PSYCH: no abnormalities of mood noted, alert and oriented to situation     ED Results / Procedures / Treatments   Labs (all labs ordered are listed, but only abnormal results are displayed) Labs Reviewed - No data to display  EKG None  Radiology No results found.  Procedures Procedures   Medications  Ordered in ED Medications - No data to display  ED Course  I have reviewed the triage vital signs and the nursing notes.     MDM Rules/Calculators/A&P                         1:40 AM Patient presents for concern that his fistula is beginning to fail. He has had this since 2015. He has had pseudoaneurysm plication previously At this time, patient is no acute distress. No active bleeding. Discussed the case with Dr. Trula Slade with vascular surgery. Since patient is hemodynamically appropriate in no acute distress this time, he will be referred for an outpatient fistulogram. The vascular office will call the patient tomorrow on February 14. As long as the procedure occurs before Wednesday 2/16, he should do well. This was discussed with the patient. Bandage applied to the fistula.  Final Clinical Impression(s) / ED Diagnoses  Final diagnoses:  ESRD (end stage renal disease) ([place])  Complication of vascular access for dialysis, initial encounter    Rx / DC Orders ED Discharge Orders    None       Ripley Fraise, MD 12/01/20 [phone]

## 2020-12-01 NOTE — Discharge Instructions (Addendum)
You should receive a call from the vascular doctors on Monday. You will need to have a fistulogram. This should be completed before Wednesday.

## 2020-12-02 ENCOUNTER — Other Ambulatory Visit: Payer: Self-pay

## 2020-12-04 ENCOUNTER — Ambulatory Visit (HOSPITAL_COMMUNITY)
Admission: RE | Admit: 2020-12-04 | Discharge: 2020-12-04 | Disposition: A | Discharge status: Home / Self Care | Payer: Medicare (Managed Care) | Attending: Vascular Surgery | Admitting: Vascular Surgery

## 2020-12-04 ENCOUNTER — Encounter (HOSPITAL_COMMUNITY)
Admission: RE | Disposition: A | Discharge status: Home / Self Care | Payer: Self-pay | Source: Home / Self Care | Attending: Vascular Surgery

## 2020-12-04 ENCOUNTER — Other Ambulatory Visit: Payer: Self-pay

## 2020-12-04 DIAGNOSIS — Y841 Kidney dialysis as the cause of abnormal reaction of the patient, or of later complication, without mention of misadventure at the time of the procedure: Secondary | ICD-10-CM | POA: Diagnosis not present

## 2020-12-04 DIAGNOSIS — T82510A Breakdown (mechanical) of surgically created arteriovenous fistula, initial encounter: Secondary | ICD-10-CM | POA: Insufficient documentation

## 2020-12-04 DIAGNOSIS — T82898A Other specified complication of vascular prosthetic devices, implants and grafts, initial encounter: Secondary | ICD-10-CM | POA: Diagnosis not present

## 2020-12-04 DIAGNOSIS — N186 End stage renal disease: Secondary | ICD-10-CM | POA: Diagnosis not present

## 2020-12-04 DIAGNOSIS — Z992 Dependence on renal dialysis: Secondary | ICD-10-CM | POA: Diagnosis not present

## 2020-12-04 HISTORY — PX: A/V FISTULAGRAM: CATH118298

## 2020-12-04 LAB — POCT I-STAT, CHEM 8
BUN: 37 mg/dL — ABNORMAL HIGH (ref 6–20)
Calcium, Ion: 1.08 mmol/L — ABNORMAL LOW (ref 1.15–1.40)
Chloride: 94 mmol/L — ABNORMAL LOW (ref 98–111)
Creatinine, Ser: 8.4 mg/dL — ABNORMAL HIGH (ref 0.61–1.24)
Glucose, Bld: 103 mg/dL — ABNORMAL HIGH (ref 70–99)
HCT: 46 % (ref 39.0–52.0)
Hemoglobin: 15.6 g/dL (ref 13.0–17.0)
Potassium: 4.3 mmol/L (ref 3.5–5.1)
Sodium: 139 mmol/L (ref 135–145)
TCO2: 33 mmol/L — ABNORMAL HIGH (ref 22–32)

## 2020-12-04 SURGERY — A/V FISTULAGRAM
Anesthesia: LOCAL | Laterality: Left

## 2020-12-04 MED ORDER — SODIUM CHLORIDE 0.9% FLUSH
3.0000 mL | INTRAVENOUS | Status: DC | PRN
Start: 2020-12-04 — End: 2020-12-04

## 2020-12-04 MED ORDER — SODIUM CHLORIDE 0.9% FLUSH: 3.0000 mL | Freq: Two times a day (BID) | INTRAVENOUS | Status: DC

## 2020-12-04 MED ORDER — ACETAMINOPHEN 325 MG PO TABS: 650.0000 mg | ORAL_TABLET | ORAL | Status: DC | PRN

## 2020-12-04 MED ORDER — HEPARIN (PORCINE) IN NACL 1000-0.9 UT/500ML-% IV SOLN
INTRAVENOUS | Status: AC
  Filled 2020-12-04: qty 500

## 2020-12-04 MED ORDER — ONDANSETRON HCL 4 MG/2ML IJ SOLN: 4.0000 mg | Freq: Four times a day (QID) | INTRAMUSCULAR | Status: DC | PRN

## 2020-12-04 MED ORDER — SODIUM CHLORIDE 0.9 % IV SOLN: 250.0000 mL | INTRAVENOUS | Status: DC | PRN

## 2020-12-04 MED ORDER — LIDOCAINE HCL (PF) 1 % IJ SOLN
INTRAMUSCULAR | Status: AC
  Filled 2020-12-04: qty 30

## 2020-12-04 MED ORDER — LABETALOL HCL 5 MG/ML IV SOLN: 10.0000 mg | INTRAVENOUS | Status: DC | PRN

## 2020-12-04 MED ORDER — IODIXANOL 320 MG/ML IV SOLN
INTRAVENOUS | Status: DC | PRN
  Administered 2020-12-04: 25 mL

## 2020-12-04 MED ORDER — SODIUM CHLORIDE 0.9% FLUSH: 3.0000 mL | INTRAVENOUS | Status: DC | PRN

## 2020-12-04 MED ORDER — HYDRALAZINE HCL 20 MG/ML IJ SOLN: 5.0000 mg | INTRAMUSCULAR | Status: DC | PRN

## 2020-12-04 MED ORDER — HEPARIN (PORCINE) IN NACL 1000-0.9 UT/500ML-% IV SOLN
INTRAVENOUS | Status: DC | PRN
  Administered 2020-12-04: 500 mL

## 2020-12-04 SURGICAL SUPPLY — 9 items
BAG SNAP BAND KOVER 36X36 (MISCELLANEOUS) ×2 IMPLANT
COVER DOME SNAP 22 D (MISCELLANEOUS) ×2 IMPLANT
KIT MICROPUNCTURE NIT STIFF (SHEATH) ×2 IMPLANT
PROTECTION STATION PRESSURIZED (MISCELLANEOUS) ×2
SHEATH PROBE COVER 6X72 (BAG) ×2 IMPLANT
STATION PROTECTION PRESSURIZED (MISCELLANEOUS) ×1 IMPLANT
STOPCOCK MORSE 400PSI 3WAY (MISCELLANEOUS) ×2 IMPLANT
TRAY PV CATH (CUSTOM PROCEDURE TRAY) ×2 IMPLANT
TUBING CIL FLEX 10 FLL-RA (TUBING) ×2 IMPLANT

## 2020-12-04 NOTE — Progress Notes (Signed)
Discharge instructions reviewed with pt voices understanding.  

## 2020-12-04 NOTE — Op Note (Signed)
DATE OF SERVICE: 12/04/2020  PATIENT:  [name] [name]  60 y.o. male  PRE-OPERATIVE DIAGNOSIS:  Prolonged bleeding after cannulation for dialysis  POST-OPERATIVE DIAGNOSIS:  Same  PROCEDURE:   left upper extremity fistulagram  SURGEON:  Surgeon(s) and Role:    * Cherre Robins, MD - Primary  ASSISTANT: none  ANESTHESIA:   local  EBL: min  BLOOD ADMINISTERED:none  DRAINS: none   LOCAL MEDICATIONS USED:  LIDOCAINE   SPECIMEN:  none  COUNTS: confirmed correct.  TOURNIQUET:  None  PATIENT DISPOSITION:  PACU - hemodynamically stable.   Delay start of Pharmacological VTE agent (>24hrs) due to surgical blood loss or risk of bleeding: no  INDICATION FOR PROCEDURE: [name] is a 60 y.o. male with prolonged bleeding after dialysis cannulation. After careful discussion of risks, benefits, and alternatives the patient was offered fistulagram. The patient understood and wished to proceed.  OPERATIVE FINDINGS: no stenosis in left upper extremity AVF  DESCRIPTION OF PROCEDURE: After identification of the patient in the pre-operative holding area, the patient was transferred to the operating room. The patient was positioned supine on the operating room table. Anesthesia was induced. The left arm was prepped and draped in standard fashion. A surgical pause was performed confirming correct patient, procedure, and operative location.  Using micropuncture technique, the peripheral arteriovenous fistula was accessed.  A 4 French sheath was advanced into the fistula.  Fistulogram was performed central to peripheral.  No evidence of flow-limiting stenosis was noted throughout the entire length of the fistula.  The sheath was removed and a figure-of-eight stitch placed to the access site with good hemostasis.  Upon completion of the case instrument and sharps counts were confirmed correct. The patient was transferred to the PACU in good condition. I was present for all portions of the  procedure.  [name]. Stanford Breed, MD [facility] Phone Number: [phone] 12/04/2020 1:45 PM

## 2020-12-04 NOTE — H&P (Signed)
  POST OPERATIVE OFFICE NOTE    CC:  skin issues over AVF  HPI:  This is a 60 y.o. male who is s/p left radial cephalic AV fistula creation in 2015.  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 Donzetta Matters.  He reports an area of concern on the distal fistula that was accessed proximately 2 weeks ago that was worrisome for hematoma and skin ulceration.  He currently states this area is much improved and he dialyzed on Saturday without complications.  Fever, chills or hand pain.  He has previously had an area of pseudoaneurysmal degenerationmore proximally that wasplicated on 02-25-15 by Dr. Donzetta Matters.  Pt dialyzes on TTS via left forearm AVF at [facility] on [address].   INTERVAL HISTORY: Patient presents from home reporting prolonged bleeding after dialysis.   Allergies  Allergen Reactions  . Fentanyl Nausea Only  . Vancomycin Other (See Comments)    Red face ? Red Man Syndrome ?    Current Facility-Administered Medications  Medication Dose Route Frequency Provider Last Rate Last Admin  . 0.9 %  sodium chloride infusion  250 mL Intravenous PRN Cherre Robins, MD      . sodium chloride flush (NS) 0.9 % injection 3 mL  3 mL Intravenous Q12H Cherre Robins, MD      . 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  Good bruit and thrill.   Neuro: Alert and oriented x4   Assessment/Plan:  60 y.o. male with prolonged bleeding from LUE RC AVF For fistulagram today.  [name]. Stanford Breed, MD [facility] Phone Number: [phone] 12/04/2020  12:51 PM

## 2020-12-04 NOTE — Discharge Instructions (Signed)
Dialysis Fistulogram  A dialysis fistulogram is an X-ray test to look inside the vascular access site. This is the area where blood is removed and returned to your body during dialysis. Fistulas and grafts provide easy and effective access for dialysis. However, sometimes problems can occur. The fistula or graft can become clogged or narrow. This can make dialysis less effective. You may need to have a fistulogram to check for these problems. Tell a health care provider about:  Any allergies you have, including any reactions you have had to contrast dye.  All medicines you are taking, including vitamins, herbs, eye drops, creams, and over-the-counter medicines.  Any problems you or family members have had with anesthetic medicines.  Any blood disorders you have.  Any surgeries you have had.  Any medical conditions you have.  Whether you are pregnant or may be pregnant.  Any warmth, pain, redness, or swelling in the limb that has the fistula or graft.  Any bleeding from your fistula or graft.  Any of the following in the area where the fistula or graft leads: ? Numbness. ? Tingling. ? A cold feeling. ? Bluish or pale white color. What are the risks? Generally, this is a safe procedure. However, problems may occur, including:  Infection.  Bleeding.  Allergic reactions to medicines or dyes.  Damage to other structures, such as nearby nerves or blood vessels.  A blood clot in the fistula or graft.  A blood clot that travels to your lungs (pulmonary embolism). What happens before the procedure? Medicines Ask your health care provider about:  Changing or stopping your regular medicines. This is especially important if you are taking diabetes medicines or blood thinners (anticoagulants).  Taking medicines such as aspirin and ibuprofen. These medicines can thin your blood. Do not take these medicines unless your health care provider tells you to take them.  Taking  over-the-counter medicines, vitamins, herbs, and supplements. General instructions  Follow instructions from your health care provider about eating and drinking restrictions.  Plan to have a responsible adult take you home from the hospital or clinic.  Plan to have a responsible adult care for you for the time you are told after you leave the hospital or clinic. This is important.  You may have a blood or urine sample taken. These will help your health care provider learn how well your kidneys and liver are working and how well your blood clots.  Ask your health care provider what steps will be taken to help prevent infection. What happens during the procedure?  An IV will be inserted into one of your veins.  You may be connected to monitors that track your heart rate, blood pressure, oxygen level, and pulse.  You will be given one or more of the following: ? A medicine to help you relax (sedative). ? A medicine to numb the area (local anesthetic) on the limb with your fistula or graft.  The area of your body where the catheter is to be inserted will be sterilized and covered with a surgical drape.  The health care provider will make a very small incision at the vascular access site.  A sheath or short tube will be inserted into the fistula or graft.  The catheter will be inserted through the sheath and advanced until it reaches the area of possible problems.  Dye, also called contrast material,will be injected into the catheter. An angiogram or X-rays will be taken to help identify the site of the blockage.    If a blockage or narrowing is found, your health care provider can do treatments during the procedure to clear the problem. Treatments to open a blocked or narrowed fistula or graft may include: ? Using a device to remove the clot from the fistula or graft. ? Using a balloon (angioplasty) to open or widen the blocked area. ? Placing a small mesh tube (stent) to keep the  fistula or graft open. ? Injecting a medicine to dissolve the clot (thrombolysis).  When the procedure is complete, the catheter will be removed and pressure will be applied to stop any bleeding.  In some cases, the catheter site will be closed with stitches (sutures) underneath the skin.  Your skin will be covered with a bandage (dressing) and the IV will be removed. The procedure may vary among health care providers and hospitals. What happens after the procedure?  Your blood pressure, heart rate, breathing rate, and blood oxygen level will be monitored until you leave the hospital or clinic. Your health care provider will also monitor your vascular access site.  You can expect to wait in a recovery area for several hours.  If you were given a sedative during the procedure, it can affect you for several hours. Do not drive or operate machinery until your health care provider says that it is safe. Summary  A dialysis fistulogram is an X-ray test to look inside the vascular access site. This is the area where blood is removed and returned to your body during dialysis.  Before the procedure, ask your health care provider if you should change or stop any medicines you are taking.  If a problem is found during a fistulogram, your health care provider can also do treatments to clear the blocked or narrowed areas. This information is not intended to replace advice given to you by your health care provider. Make sure you discuss any questions you have with your health care provider. Document Revised: 05/15/2020 Document Reviewed: 05/15/2020 Elsevier Patient Education  2021 Elsevier Inc.  

## 2020-12-05 ENCOUNTER — Encounter (HOSPITAL_COMMUNITY): Payer: Self-pay | Admitting: Vascular Surgery

## 2020-12-05 MED FILL — Lidocaine HCl Local Preservative Free (PF) Inj 1%: INTRAMUSCULAR | Qty: 30 | Status: AC

## 2021-01-03 ENCOUNTER — Other Ambulatory Visit: Payer: Self-pay | Admitting: *Deleted

## 2021-01-03 DIAGNOSIS — N186 End stage renal disease: Secondary | ICD-10-CM

## 2021-01-06 ENCOUNTER — Ambulatory Visit (INDEPENDENT_AMBULATORY_CARE_PROVIDER_SITE_OTHER): Payer: Medicare (Managed Care) | Admitting: Physician Assistant

## 2021-01-06 ENCOUNTER — Other Ambulatory Visit: Payer: Self-pay

## 2021-01-06 ENCOUNTER — Ambulatory Visit (HOSPITAL_COMMUNITY)
Admission: RE | Admit: 2021-01-06 | Discharge: 2021-01-06 | Disposition: A | Discharge status: Home / Self Care | Payer: Medicare (Managed Care) | Source: Ambulatory Visit | Attending: Surgery | Admitting: Surgery

## 2021-01-06 VITALS — BP 138/85 | HR 78 | Temp 97.6°F | Resp 18 | Ht 67.0 in | Wt 180.0 lb

## 2021-01-06 DIAGNOSIS — N186 End stage renal disease: Secondary | ICD-10-CM

## 2021-01-06 NOTE — Progress Notes (Signed)
Office Note     CC:  follow up Requesting Provider:  No ref. provider found  HPI: [name] [name] is a 60 y.o. (1960-10-21) male who presents status post left arm fistulogram due to prolonged needle hole bleeding and enlarging aneurysmal degeneration.  The fistulogram was diagnostic in nature.  Patient believes bleeding time after fistulogram has improved.  He is not interested in plication at this time.  Fistula was initially created in 2015.  He denies any open wounds or sores overlying aneurysmal degeneration.  He dialyzes on a Tuesday Thursday Saturday schedule at the [facility] location in [place].    Past Medical History:  Diagnosis Date  . Anemia   . Anxiety    situational  . DVT (deep venous thrombosis) (HCC)    leg  . ESRD (end stage renal disease) ([place])    TTHSAT [place]  . GERD (gastroesophageal reflux disease)   . Hypertension   . Leucocytosis   . Polycystic kidney disease   . Shortness of breath     Past Surgical History:  Procedure Laterality Date  . A/V FISTULAGRAM Left 12/04/2020   Procedure: A/V FISTULAGRAM;  Surgeon: Cherre Robins, MD;  Location: [facility];  Service: Cardiovascular;  Laterality: Left;  . AV FISTULA PLACEMENT Left 04/05/2014   Procedure: CREATION OF ARTERIOVENOUS (AV) FISTULA ;  Surgeon: Angelia Mould, MD;  Location: [facility];  Service: Vascular;  Laterality: Left;  . CENTRAL VENOUS CATHETER INSERTION  04/02/2014   DR Titus Mould  . COLONOSCOPY    . FISTULA SUPERFICIALIZATION Left 6/0/4540   Procedure: PLICATION OF ANEURYSM ARTERIOVENOUS FISTULA LEFT ARM;  Surgeon: Waynetta Sandy, MD;  Location: [facility];  Service: Vascular;  Laterality: Left;  . INSERTION OF DIALYSIS CATHETER Right 04/05/2014   Procedure: ULTRASOUND GUIDED INSERTION OF DIALYSIS CATHETER;  Surgeon: Angelia Mould, MD;  Location: [facility];  Service: Vascular;  Laterality: Right;  . NOSE SURGERY    . REVISON OF ARTERIOVENOUS FISTULA Left 9/81/1914    Procedure: PLICATION OF LEFT ARM ARTERIOVENOUS FISTULA;  Surgeon: Waynetta Sandy, MD;  Location: [facility];  Service: Vascular;  Laterality: Left;    Social History   Socioeconomic History  . Marital status: Single    Spouse name: Not on file  . Number of children: Not on file  . Years of education: Not on file  . Highest education level: Not on file  Occupational History  . Not on file  Tobacco Use  . Smoking status: Former Research scientist (life sciences)  . Smokeless tobacco: Never Used  . Tobacco comment: QUIT SMOKING CIGARETTES  IN 1994      QUIT SMOKING POT   Vaping Use  . Vaping Use: Never used  Substance and Sexual Activity  . Alcohol use: No  . Drug use: No  . Sexual activity: Not on file  Other Topics Concern  . Not on file  Social History Narrative  . Not on file   Social Determinants of Health   Financial Resource Strain: Not on file  Food Insecurity: Not on file  Transportation Needs: Not on file  Physical Activity: Not on file  Stress: Not on file  Social Connections: Not on file  Intimate Partner Violence: Not on file    Family History  Problem Relation Age of Onset  . Polycystic kidney disease Father   . Hypertension Father   . Polycystic kidney disease Sister     Current Outpatient Medications  Medication Sig Dispense Refill  . acetaminophen (  TYLENOL) 500 MG tablet Take 500-1,000 mg by mouth every 6 (six) hours as needed for moderate pain.    . cinacalcet (SENSIPAR) 30 MG tablet Take 30 mg by mouth every evening.     . diphenhydramine-acetaminophen (TYLENOL PM) 25-500 MG TABS tablet Take 1 tablet by mouth at bedtime as needed (sleep).    . ferric citrate (AURYXIA) 1 GM 210 MG(Fe) tablet Take 210-630 mg by mouth 3 (three) times daily with meals. Take 630 mg with each meal and 210 - 420 mg with each snack    . fluticasone (FLONASE) 50 MCG/ACT nasal spray Place 2 sprays into both nostrils daily as needed for allergies.    . multivitamin (RENA-VIT) TABS tablet Take 1  tablet by mouth every morning. 30 tablet PRN  . pantoprazole (PROTONIX) 40 MG tablet Take 40 mg by mouth 2 (two) times daily.    . Pediatric Multiple Vit-C-FA (CHILDRENS MULTIVITAMIN PO) Take 1 tablet by mouth daily.    . hydrocortisone cream 1 % Apply 1 application topically daily as needed for itching. (Patient not taking: No sig reported)     No current facility-administered medications for this visit.    Allergies  Allergen Reactions  . Fentanyl Nausea Only  . Vancomycin Other (See Comments)    Red face ? Red Man Syndrome ?     REVIEW OF SYSTEMS:   [X]  denotes positive finding, [ ]  denotes negative finding Cardiac  Comments:  Chest pain or chest pressure:    Shortness of breath upon exertion:    Short of breath when lying flat:    Irregular heart rhythm:        Vascular    Pain in calf, thigh, or hip brought on by ambulation:    Pain in feet at night that wakes you up from your sleep:     Blood clot in your veins:    Leg swelling:         Pulmonary    Oxygen at home:    Productive cough:     Wheezing:         Neurologic    Sudden weakness in arms or legs:     Sudden numbness in arms or legs:     Sudden onset of difficulty speaking or slurred speech:    Temporary loss of vision in one eye:     Problems with dizziness:         Gastrointestinal    Blood in stool:     Vomited blood:         Genitourinary    Burning when urinating:     Blood in urine:        Psychiatric    Major depression:         Hematologic    Bleeding problems:    Problems with blood clotting too easily:        Skin    Rashes or ulcers:        Constitutional    Fever or chills:      PHYSICAL EXAMINATION:  Vitals:   01/06/21 1609  BP: 138/85  Pulse: 78  Resp: 18  Temp: 97.6 F (36.4 C)  TempSrc: Temporal  SpO2: 98%  Weight: 180 lb (81.6 kg)  Height: 5\' 7"  (1.702 m)    General:  WDWN in NAD; vital signs documented above Gait: Not observed HENT: WNL,  normocephalic Pulmonary: normal non-labored breathing , without Rales, rhonchi,  wheezing Cardiac: regular HR Abdomen: soft, NT, no masses Skin:  without rashes Vascular Exam/Pulses: Easily palpable thrill throughout radiocephalic fistula and into the basilic and cephalic vein above the AC fossa; symmetrical grip strength Extremities: Skin overlying AV fistula is mobile and without any areas concerning for spontaneous rupture Musculoskeletal: no muscle wasting or atrophy  Neurologic: A&O X 3;  No focal weakness or paresthesias are detected Psychiatric:  The pt has Normal affect.   Non-Invasive Vascular Imaging:   Widely patent AV fistula without any areas of hemodynamically significant stenosis    ASSESSMENT/PLAN:: 60 y.o. male status post fistulogram by Dr. Stanford Breed on 12/04/2020  -Left radiocephalic fistula widely patent without any areas of stenosis seen on fistulogram -All overlying skin is mobile and without wounds that would be concerning for spontaneous hemorrhage; no absolute indication currently for plication procedure -Patient may follow-up on an as-needed basis   Dagoberto Ligas, PA-C [facility] [phone]  Clinic MD:   Trula Slade

## 2021-03-10 ENCOUNTER — Encounter: Payer: Self-pay | Admitting: Physician Assistant

## 2021-03-10 ENCOUNTER — Other Ambulatory Visit: Payer: Self-pay

## 2021-03-10 ENCOUNTER — Ambulatory Visit (INDEPENDENT_AMBULATORY_CARE_PROVIDER_SITE_OTHER): Payer: Medicare (Managed Care) | Admitting: Physician Assistant

## 2021-03-10 ENCOUNTER — Ambulatory Visit: Payer: Medicare (Managed Care)

## 2021-03-10 VITALS — BP 114/71 | HR 57 | Temp 97.7°F | Resp 20 | Ht 67.0 in | Wt 182.2 lb

## 2021-03-10 DIAGNOSIS — I770 Arteriovenous fistula, acquired: Secondary | ICD-10-CM

## 2021-03-10 DIAGNOSIS — N186 End stage renal disease: Secondary | ICD-10-CM | POA: Diagnosis not present

## 2021-03-10 NOTE — Progress Notes (Signed)
    Established Dialysis Access   History of Present Illness   [name] [name] is a 60 y.o. 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 Dialysis techs sent him over due to concerns for the aneurysms enlarging. He was previously seen in March due to similar concerns but was also having prolonged bleeding from needle holes. He had undergone Fistulogram that showed no stenosis in fistula. He reports that the fistula is working well with no recurrent bleeding issues. He has history of previous revision and plication of pseudoaneurysm by Dr. Donzetta Matters in September of 2020.  He dialyzes on Tues/ Thurs/ Saturday at the [facility]   Current Outpatient Medications  Medication Sig Dispense Refill  . acetaminophen (TYLENOL) 500 MG tablet Take 500-1,000 mg by mouth every 6 (six) hours as needed for moderate pain.    . cinacalcet (SENSIPAR) 30 MG tablet Take 30 mg by mouth every evening.     . diphenhydramine-acetaminophen (TYLENOL PM) 25-500 MG TABS tablet Take 1 tablet by mouth at bedtime as needed (sleep).    . ferric citrate (AURYXIA) 1 GM 210 MG(Fe) tablet Take 210-630 mg by mouth 3 (three) times daily with meals. Take 630 mg with each meal and 210 - 420 mg with each snack    . fluticasone (FLONASE) 50 MCG/ACT nasal spray Place 2 sprays into both nostrils daily as needed for allergies.    . hydrocortisone cream 1 % Apply 1 application topically daily as needed for itching.    . multivitamin (RENA-VIT) TABS tablet Take 1 tablet by mouth every morning. 30 tablet PRN  . pantoprazole (PROTONIX) 40 MG tablet Take 40 mg by mouth 2 (two) times daily.    . Pediatric Multiple Vit-C-FA (CHILDRENS MULTIVITAMIN PO) Take 1 tablet by mouth daily.     No current facility-administered medications for this visit.    On ROS today: negative unless stated in HPI   Physical Examination   Vitals:   03/10/21 1153  BP: 114/71  Pulse:  (!) 57  Resp: 20  Temp: 97.7 F (36.5 C)  TempSrc: Temporal  SpO2: 98%  Weight: 182 lb 3.2 oz (82.6 kg)  Height: 5\' 7"  (1.702 m)   Body mass index is 28.54 kg/m.  General Well appearing, not in any distress  Pulmonary Non labored  Cardiac Regular rate and rhythm  Vascular Left AV fistula with good thrill, audible bruit. No pulsatility. Diffusely aneurysmal throughout fistula. Some hypopigmentation but skin is mobile and no ulceration or skin breakdown     Musculo- skeletal M/S 5/5 throughout  , Extremities without ischemic changes    Neurologic A&O; CN grossly intact     Medical Decision Making   [name] [name] is a 60 y.o. 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 Fistula is functioning well without bleeding. All overlying skin is mobile and without any wounds. He has some hypopigmentation but this is minimal and unchanged. At this time there is no concerns for spontaneous bleeding. I recommend continuing close observation but no indication for plication at this time.  He will follow up as needed   Karoline Caldwell, PA-C [facility] [place] [id number]

## 2021-11-17 ENCOUNTER — Other Ambulatory Visit: Payer: Self-pay | Admitting: Registered Nurse

## 2021-11-17 DIAGNOSIS — R1011 Right upper quadrant pain: Secondary | ICD-10-CM

## 2021-11-27 ENCOUNTER — Other Ambulatory Visit: Payer: Medicare (Managed Care)

## 2023-03-31 ENCOUNTER — Ambulatory Visit (INDEPENDENT_AMBULATORY_CARE_PROVIDER_SITE_OTHER): Payer: Medicare (Managed Care) | Admitting: Vascular Surgery

## 2023-03-31 ENCOUNTER — Encounter: Payer: Self-pay | Admitting: Vascular Surgery

## 2023-03-31 VITALS — BP 93/62 | HR 77 | Temp 97.9°F | Resp 20 | Ht 67.0 in | Wt 163.0 lb

## 2023-03-31 DIAGNOSIS — N186 End stage renal disease: Secondary | ICD-10-CM

## 2023-03-31 NOTE — Progress Notes (Signed)
Patient ID: [name] [name], male   DOB: September 24, 1961, 62 y.o.   MRN: [id number]  Reason for Consult: Follow-up   Referred by Maxie Barb, *  Subjective:     HPI:  [name] [name] is a 62 y.o. male history of end-stage renal disease currently on dialysis via left radial artery to cephalic vein AV fistula in the forearm.  Recently he has developed some scabbing over the forearm fistula but has not had any increased bleeding times or required excessive pressure after decannulation.  He is here today for evaluation of the fistula.   Past Medical History:  Diagnosis Date   Anemia    Anxiety    situational   DVT (deep venous thrombosis) (HCC)    leg   ESRD (end stage renal disease) (HCC)    TTHSAT [place]   GERD (gastroesophageal reflux disease)    Hypertension    Leucocytosis    Polycystic kidney disease    Shortness of breath    Family History  Problem Relation Age of Onset   Polycystic kidney disease Father    Hypertension Father    Polycystic kidney disease Sister    Past Surgical History:  Procedure Laterality Date   A/V FISTULAGRAM Left 12/04/2020   Procedure: A/V FISTULAGRAM;  Surgeon: Leonie Douglas, MD;  [facility]: [facility];  Service: Cardiovascular;  Laterality: Left;   AV FISTULA PLACEMENT Left 04/05/2014   Procedure: CREATION OF ARTERIOVENOUS (AV) FISTULA ;  Surgeon: Chuck Hint, MD;  Location: [facility] OR;  Service: Vascular;  Laterality: Left;   CENTRAL VENOUS CATHETER INSERTION  04/02/2014   DR Tyson Alias   COLONOSCOPY     FISTULA SUPERFICIALIZATION Left 05/24/2019   Procedure: PLICATION OF ANEURYSM ARTERIOVENOUS FISTULA LEFT ARM;  Surgeon: Maeola Harman, MD;  Location: [facility] OR;  Service: Vascular;  Laterality: Left;   INSERTION OF DIALYSIS CATHETER Right 04/05/2014   Procedure: ULTRASOUND GUIDED INSERTION OF DIALYSIS CATHETER;  Surgeon: Chuck Hint, MD;  Location: [facility] OR;  Service: Vascular;  Laterality: Right;   NOSE SURGERY      REVISON OF ARTERIOVENOUS FISTULA Left 07/19/2019   Procedure: PLICATION OF LEFT ARM ARTERIOVENOUS FISTULA;  Surgeon: Maeola Harman, MD;  Location: [facility] OR;  Service: Vascular;  Laterality: Left;    Short Social History:  Social History   Tobacco Use   Smoking status: Former   Smokeless tobacco: Never   Tobacco comments:    QUIT SMOKING CIGARETTES  IN 1994      QUIT SMOKING POT   Substance Use Topics   Alcohol use: No    Allergies  Allergen Reactions   Fentanyl Nausea Only   Vancomycin Other (See Comments)    Red face ? Red Man Syndrome ?    Current Outpatient Medications  Medication Sig Dispense Refill   acetaminophen (TYLENOL) 500 MG tablet Take 500-1,000 mg by mouth every 6 (six) hours as needed for moderate pain.     cinacalcet (SENSIPAR) 30 MG tablet Take 30 mg by mouth every evening.      diphenhydramine-acetaminophen (TYLENOL PM) 25-500 MG TABS tablet Take 1 tablet by mouth at bedtime as needed (sleep).     ferric citrate (AURYXIA) 1 GM 210 MG(Fe) tablet Take 210-630 mg by mouth 3 (three) times daily with meals. Take 630 mg with each meal and 210 - 420 mg with each snack     fluticasone (FLONASE) 50 MCG/ACT nasal spray Place 2 sprays into both nostrils daily as needed for allergies.  hydrocortisone cream 1 % Apply 1 application topically daily as needed for itching.     multivitamin (RENA-VIT) TABS tablet Take 1 tablet by mouth every morning. 30 tablet PRN   pantoprazole (PROTONIX) 40 MG tablet Take 40 mg by mouth 2 (two) times daily.     Pediatric Multiple Vit-C-FA (CHILDRENS MULTIVITAMIN PO) Take 1 tablet by mouth daily.     No current facility-administered medications for this visit.    Review of Systems  Constitutional:  Constitutional negative. HENT: HENT negative.  Eyes: Eyes negative.  Respiratory: Respiratory negative.  Cardiovascular: Cardiovascular negative.  GI: Gastrointestinal negative.  Skin:       Scab on AV fistula left  forearm Neurological: Neurological negative. Hematologic: Hematologic/lymphatic negative.  Psychiatric: Psychiatric negative.        Objective:  Objective   Vitals:   03/31/23 1558  BP: 93/62  Pulse: 77  Resp: 20  Temp: 97.9 F (36.6 C)  SpO2: 98%  Weight: 163 lb (73.9 kg)  Height: 5\' 7"  (1.702 m)   Body mass index is 25.53 kg/m.  Physical Exam HENT:     Head: Normocephalic.     Mouth/Throat:     Mouth: Mucous membranes are moist.  Eyes:     Pupils: Pupils are equal, round, and reactive to light.  Cardiovascular:     Rate and Rhythm: Normal rate.     Pulses:          Radial pulses are 2+ on the right side.  Pulmonary:     Effort: Pulmonary effort is normal.  Musculoskeletal:     Comments: Large serpentine left upper extremity fistula with scabbing over the fistula but I can pinch the skin overlying this there is no evidence of bleeding.  Neurological:     Mental Status: He is alert.     Data: No studies today     Assessment/Plan:     62 year old male with end-stage renal disease on dialysis via left arm fistula with 2 areas of scabbing over the fistula I can pinch the skin over this I do not see any evidence of bleeding.  I recommend he place a bandage over the fistula at the time of dialysis to protect it and he should be cannulated to alternate sides which she has plenty of room for cannulation both in his forearm and towards the antecubitum.  He can see me on an as-needed basis.     Maeola Harman MD [facility]

## 2023-09-08 ENCOUNTER — Encounter: Payer: Self-pay | Admitting: Vascular Surgery

## 2023-09-08 ENCOUNTER — Ambulatory Visit (INDEPENDENT_AMBULATORY_CARE_PROVIDER_SITE_OTHER): Payer: Medicare (Managed Care) | Admitting: Vascular Surgery

## 2023-09-08 VITALS — BP 78/44 | HR 89 | Temp 97.9°F | Resp 20 | Ht 67.0 in | Wt 171.0 lb

## 2023-09-08 DIAGNOSIS — N186 End stage renal disease: Secondary | ICD-10-CM | POA: Diagnosis not present

## 2023-09-08 NOTE — Progress Notes (Signed)
Patient ID: [name] [name], male   DOB: [birth year], 62 y.o.   MRN: [id number]  Reason for Consult: Follow-up   Referred by Medicine, [facility]*  Subjective:     HPI:  [name] [name] is a 62 y.o. male history of end-stage renal disease on dialysis via left forearm AV fistula.  He again has some issues with bleeding at the site of bandage placement but has not had prolonged bleeding after dialysis.  He does have some pain around the wrist area but this is unchanged and his hand is warm and well-perfused without any sensory and motor issues.  Past Medical History:  Diagnosis Date   Anemia    Anxiety    situational   DVT (deep venous thrombosis) (HCC)    leg   ESRD (end stage renal disease) (HCC)    TTHSAT [place]   GERD (gastroesophageal reflux disease)    Hypertension    Leucocytosis    Polycystic kidney disease    Shortness of breath    Family History  Problem Relation Age of Onset   Polycystic kidney disease Father    Hypertension Father    Polycystic kidney disease Sister    Past Surgical History:  Procedure Laterality Date   A/V FISTULAGRAM Left 12/04/2020   Procedure: A/V FISTULAGRAM;  Surgeon: Leonie Douglas, MD;  Location: [facility];  Service: Cardiovascular;  Laterality: Left;   AV FISTULA PLACEMENT Left 04/05/2014   Procedure: CREATION OF ARTERIOVENOUS (AV) FISTULA ;  Surgeon: Chuck Hint, MD;  Location: [facility] OR;  Service: Vascular;  Laterality: Left;   CENTRAL VENOUS CATHETER INSERTION  04/02/2014   DR Tyson Alias   COLONOSCOPY     FISTULA SUPERFICIALIZATION Left 05/24/2019   Procedure: PLICATION OF ANEURYSM ARTERIOVENOUS FISTULA LEFT ARM;  Surgeon: Maeola Harman, MD;  Location: [facility] OR;  Service: Vascular;  Laterality: Left;   INSERTION OF DIALYSIS CATHETER Right 04/05/2014   Procedure: ULTRASOUND GUIDED INSERTION OF DIALYSIS CATHETER;  Surgeon: Chuck Hint, MD;  Location: [facility] OR;  Service: Vascular;  Laterality: Right;   NOSE  SURGERY     REVISON OF ARTERIOVENOUS FISTULA Left 07/19/2019   Procedure: PLICATION OF LEFT ARM ARTERIOVENOUS FISTULA;  Surgeon: Maeola Harman, MD;  Location: [facility] OR;  Service: Vascular;  Laterality: Left;    Short Social History:  Social History   Tobacco Use   Smoking status: Former   Smokeless tobacco: Never   Tobacco comments:    QUIT SMOKING CIGARETTES  IN 1994      QUIT SMOKING POT   Substance Use Topics   Alcohol use: No    Allergies  Allergen Reactions   Fentanyl Nausea Only   Vancomycin Other (See Comments)    Red face ? Red Man Syndrome ?    Current Outpatient Medications  Medication Sig Dispense Refill   acetaminophen (TYLENOL) 500 MG tablet Take 500-1,000 mg by mouth every 6 (six) hours as needed for moderate pain.     cinacalcet (SENSIPAR) 30 MG tablet Take 30 mg by mouth every evening.      diphenhydramine-acetaminophen (TYLENOL PM) 25-500 MG TABS tablet Take 1 tablet by mouth at bedtime as needed (sleep).     ferric citrate (AURYXIA) 1 GM 210 MG(Fe) tablet Take 210-630 mg by mouth 3 (three) times daily with meals. Take 630 mg with each meal and 210 - 420 mg with each snack     fluticasone (FLONASE) 50 MCG/ACT nasal spray Place 2 sprays into both nostrils daily  as needed for allergies.     hydrocortisone cream 1 % Apply 1 application topically daily as needed for itching.     multivitamin (RENA-VIT) TABS tablet Take 1 tablet by mouth every morning. 30 tablet PRN   pantoprazole (PROTONIX) 40 MG tablet Take 40 mg by mouth 2 (two) times daily.     Pediatric Multiple Vit-C-FA (CHILDRENS MULTIVITAMIN PO) Take 1 tablet by mouth daily.     No current facility-administered medications for this visit.    Review of Systems  Constitutional:  Constitutional negative. HENT: HENT negative.  Eyes: Eyes negative.  Cardiovascular: Cardiovascular negative.  GI: Gastrointestinal negative.  Musculoskeletal: Musculoskeletal negative.  Skin:       Left forearm  scabbing and flaky skin Hematologic: Hematologic/lymphatic negative.  Psychiatric: Psychiatric negative.        Objective:  Objective   Vitals:   09/08/23 0943  BP: (!) 78/44  Pulse: 89  Resp: 20  Temp: 97.9 F (36.6 C)  SpO2: 95%  Weight: 171 lb (77.6 kg)  Height: 5\' 7"  (1.702 m)   Body mass index is 26.78 kg/m.  Physical Exam HENT:     Head: Normocephalic.     Nose: Nose normal.     Mouth/Throat:     Mouth: Mucous membranes are moist.  Eyes:     Pupils: Pupils are equal, round, and reactive to light.  Cardiovascular:     Rate and Rhythm: Normal rate.  Pulmonary:     Effort: Pulmonary effort is normal.  Abdominal:     General: Abdomen is flat.  Musculoskeletal:        General: Normal range of motion.     Cervical back: Normal range of motion.     Comments: Large serpentine left upper extremity fistula with scabbing over the fistula but I can pinch the skin overlying the fistula  Skin:    General: Skin is dry.     Capillary Refill: Capillary refill takes less than 2 seconds.  Neurological:     General: No focal deficit present.     Mental Status: He is alert.  Psychiatric:        Mood and Affect: Mood normal.        Thought Content: Thought content normal.        Judgment: Judgment normal.     Data: No studies     Assessment/Plan:    62 year old male on dialysis via left forearm AV fistula.  Again I can pinch the skin over the fistula I do not think he needs any intervention at this time but if he has continued concerns we can always revise the fistula realizing that this may cause permanent fistula malfunction or thrombosis.  He demonstrates good understanding of this and can see me on an as-needed basis.    Maeola Harman MD [facility]

## 2023-09-17 ENCOUNTER — Emergency Department (HOSPITAL_COMMUNITY): Payer: Medicare (Managed Care)

## 2023-09-17 ENCOUNTER — Other Ambulatory Visit: Payer: Self-pay

## 2023-09-17 ENCOUNTER — Encounter (HOSPITAL_COMMUNITY): Payer: Self-pay | Admitting: Emergency Medicine

## 2023-09-17 ENCOUNTER — Observation Stay (HOSPITAL_COMMUNITY)
Admission: EM | Admit: 2023-09-17 | Discharge: 2023-09-19 | Disposition: A | Discharge status: Home / Self Care | Payer: Medicare (Managed Care) | Attending: Internal Medicine | Admitting: Internal Medicine

## 2023-09-17 DIAGNOSIS — Z87891 Personal history of nicotine dependence: Secondary | ICD-10-CM | POA: Diagnosis not present

## 2023-09-17 DIAGNOSIS — I120 Hypertensive chronic kidney disease with stage 5 chronic kidney disease or end stage renal disease: Secondary | ICD-10-CM | POA: Diagnosis not present

## 2023-09-17 DIAGNOSIS — E875 Hyperkalemia: Secondary | ICD-10-CM | POA: Insufficient documentation

## 2023-09-17 DIAGNOSIS — Z992 Dependence on renal dialysis: Secondary | ICD-10-CM | POA: Diagnosis not present

## 2023-09-17 DIAGNOSIS — Z86718 Personal history of other venous thrombosis and embolism: Secondary | ICD-10-CM | POA: Diagnosis not present

## 2023-09-17 DIAGNOSIS — D631 Anemia in chronic kidney disease: Secondary | ICD-10-CM | POA: Diagnosis not present

## 2023-09-17 DIAGNOSIS — W19XXXA Unspecified fall, initial encounter: Principal | ICD-10-CM | POA: Diagnosis present

## 2023-09-17 DIAGNOSIS — N186 End stage renal disease: Secondary | ICD-10-CM | POA: Insufficient documentation

## 2023-09-17 DIAGNOSIS — S2241XA Multiple fractures of ribs, right side, initial encounter for closed fracture: Secondary | ICD-10-CM | POA: Diagnosis not present

## 2023-09-17 DIAGNOSIS — E213 Hyperparathyroidism, unspecified: Secondary | ICD-10-CM | POA: Insufficient documentation

## 2023-09-17 DIAGNOSIS — S299XXA Unspecified injury of thorax, initial encounter: Secondary | ICD-10-CM | POA: Diagnosis present

## 2023-09-17 DIAGNOSIS — I4891 Unspecified atrial fibrillation: Secondary | ICD-10-CM | POA: Diagnosis not present

## 2023-09-17 LAB — COMPREHENSIVE METABOLIC PANEL
ALT: 31 U/L (ref 0–44)
AST: 25 U/L (ref 15–41)
Albumin: 4.1 g/dL (ref 3.5–5.0)
Alkaline Phosphatase: 63 U/L (ref 38–126)
Anion gap: 15 (ref 5–15)
BUN: 75 mg/dL — ABNORMAL HIGH (ref 6–20)
CO2: 26 mmol/L (ref 22–32)
Calcium: 10.1 mg/dL (ref 8.9–10.3)
Chloride: 98 mmol/L (ref 98–111)
Creatinine, Ser: 15.19 mg/dL — ABNORMAL HIGH (ref 0.61–1.24)
GFR, Estimated: 3 mL/min — ABNORMAL LOW (ref >60)
Glucose, Bld: 120 mg/dL — ABNORMAL HIGH (ref 70–99)
Potassium: 5.3 mmol/L — ABNORMAL HIGH (ref 3.5–5.1)
Sodium: 139 mmol/L (ref 135–145)
Total Bilirubin: 0.6 mg/dL (ref <1.2)
Total Protein: 6.7 g/dL (ref 6.5–8.1)

## 2023-09-17 LAB — CBC WITH DIFFERENTIAL/PLATELET
Abs Immature Granulocytes: 0.01 10*3/uL (ref 0.00–0.07)
Basophils Absolute: 0 10*3/uL (ref 0.0–0.1)
Basophils Relative: 0 %
Eosinophils Absolute: 0.1 10*3/uL (ref 0.0–0.5)
Eosinophils Relative: 2 %
HCT: 32.6 % — ABNORMAL LOW (ref 39.0–52.0)
Hemoglobin: 11 g/dL — ABNORMAL LOW (ref 13.0–17.0)
Immature Granulocytes: 0 %
Lymphocytes Relative: 16 %
Lymphs Abs: 0.7 10*3/uL (ref 0.7–4.0)
MCH: 32.2 pg (ref 26.0–34.0)
MCHC: 33.7 g/dL (ref 30.0–36.0)
MCV: 95.3 fL (ref 80.0–100.0)
Monocytes Absolute: 0.4 10*3/uL (ref 0.1–1.0)
Monocytes Relative: 9 %
Neutro Abs: 3 10*3/uL (ref 1.7–7.7)
Neutrophils Relative %: 73 %
Platelets: 52 10*3/uL — ABNORMAL LOW (ref 150–400)
RBC: 3.42 MIL/uL — ABNORMAL LOW (ref 4.22–5.81)
RDW: 13 % (ref 11.5–15.5)
WBC: 4.1 10*3/uL (ref 4.0–10.5)
nRBC: 0 % (ref 0.0–0.2)

## 2023-09-17 MED ORDER — HYDROMORPHONE HCL 1 MG/ML IJ SOLN
0.5000 mg | Freq: Once | INTRAMUSCULAR | Status: AC
  Administered 2023-09-17: 0.5 mg via INTRAVENOUS
  Filled 2023-09-17: qty 1

## 2023-09-17 NOTE — ED Triage Notes (Signed)
Pt arrives via EMS from  home with a fall out of bed, walked to living room. Pt c/o spasms to lower back after fall. No LOC, no neck pain. Pt alert and oriented x4. Pt is Pt is normally Tues, Thursday, Sat dialysis pt. Last session on Wednesday. VSS. Pt took 100mg  tylenol PTA.

## 2023-09-17 NOTE — ED Notes (Signed)
The pt is c/o lower back pain since he fell from his bed earlier today alert and oriented x 4

## 2023-09-17 NOTE — ED Notes (Signed)
To ct

## 2023-09-17 NOTE — ED Provider Notes (Signed)
[name] [name] EMERGENCY DEPARTMENT AT [facility] Provider Note   CSN: [id number] Arrival date & time: 09/17/23  1746     History {Add pertinent medical, surgical, social history, OB history to HPI:1} Chief Complaint  Patient presents with   [name] [name] is a 62 y.o. male.  HPI Patient goes to dialysis regularly on schedule.  Normal schedule is Tuesday Thursday Saturday.  Due to the holiday patient's last session was Wednesday and he is due tomorrow.  Patient reports he was taking a nap at about 3 PM and fell asleep too close to the edge of his bed.  He reports he rolled out of bed and fell onto the floor onto his back.  Patient reports his bed is about 3 feet off the floor and his floor is very hard concrete.  He denies getting knocked out.  He denies neck pain or headache.  He reports he has severe pain in his back mostly to the right side.  He indicates his lower thoracic and lumbar area.  He reports he is getting intense spasms of pain.  He denies feeling lightheaded or dizzy.  He was able to get up off of the floor.  He reports that he could walk.  He reports it was very painful but he did not have weakness or numbness into the legs.  Patient does not take anticoagulants.    Home Medications Prior to Admission medications   Medication Sig Start Date End Date Taking? Authorizing Provider  acetaminophen (TYLENOL) 500 MG tablet Take 500-1,000 mg by mouth every 6 (six) hours as needed for moderate pain.    [provider]  cinacalcet (SENSIPAR) 30 MG tablet Take 30 mg by mouth every evening.     [provider]  diphenhydramine-acetaminophen (TYLENOL PM) 25-500 MG TABS tablet Take 1 tablet by mouth at bedtime as needed (sleep).    [provider]  ferric citrate (AURYXIA) 1 GM 210 MG(Fe) tablet Take 210-630 mg by mouth 3 (three) times daily with meals. Take 630 mg with each meal and 210 - 420 mg with each snack    [provider]   fluticasone (FLONASE) 50 MCG/ACT nasal spray Place 2 sprays into both nostrils daily as needed for allergies. 12/15/17   [provider]  hydrocortisone cream 1 % Apply 1 application topically daily as needed for itching.    [provider]  multivitamin (RENA-VIT) TABS tablet Take 1 tablet by mouth every morning. 04/10/14   Gerome Apley, PA-C  pantoprazole (PROTONIX) 40 MG tablet Take 40 mg by mouth 2 (two) times daily.    [provider]  Pediatric Multiple Vit-C-FA (CHILDRENS MULTIVITAMIN PO) Take 1 tablet by mouth daily.    [provider]      Allergies    Fentanyl and Vancomycin    Review of Systems   Review of Systems  Physical Exam Updated Vital Signs BP 116/64   Pulse 65   Temp (!) 97.5 F (36.4 C) (Oral)   Resp 15   Ht 5\' 7"  (1.702 m)   Wt 77.5 kg   SpO2 100%   BMI 26.76 kg/m  Physical Exam Constitutional:      Comments: Alert nontoxic clear mental status.  No respiratory distress.  Color is good.  Patientis in pain especially with certain movements.  HENT:     Head: Normocephalic and atraumatic.     Mouth/Throat:     Pharynx: Oropharynx is clear.  Eyes:  Extraocular Movements: Extraocular movements intact.  Neck:     Comments: No midline C-spine tenderness. Cardiovascular:     Rate and Rhythm: Normal rate and regular rhythm.  Pulmonary:     Effort: Pulmonary effort is normal.     Breath sounds: Normal breath sounds.     Comments: No visual bruising of the chest wall.  Patient does endorse tenderness to the posterior thoracic right chest wall.  No crepitus.  No notable abnormalities. Abdominal:     General: There is no distension.     Palpations: Abdomen is soft.     Tenderness: There is no abdominal tenderness. There is no guarding.  Musculoskeletal:     Comments: Inspection of the back does not show any contusions abrasions or hematomas.  Patient endorses extensive pain to palpation over the thoracic spine and the  lumbar spine as well as the mid to the lower thoracic chest wall on the right.  Normal range of motion of the hips without pain.  No deformities of the lower extremities.  No contusions or abrasions to the extremities.  Patient has his dialysis fistula on the forearm of the left arm.  Skin:    General: Skin is warm and dry.  Neurological:     General: No focal deficit present.     Mental Status: He is oriented to person, place, and time.     Motor: No weakness.     Coordination: Coordination normal.     ED Results / Procedures / Treatments   Labs (all labs ordered are listed, but only abnormal results are displayed) Labs Reviewed  COMPREHENSIVE METABOLIC PANEL  CBC WITH DIFFERENTIAL/PLATELET    EKG None  Radiology No results found.  Procedures Procedures  {Document cardiac monitor, telemetry assessment procedure when appropriate:1}  Medications Ordered in ED Medications  HYDROmorphone (DILAUDID) injection 0.5 mg (has no administration in time range)    ED Course/ Medical Decision Making/ A&P   {   Click here for ABCD2, HEART and other calculatorsREFRESH Note before signing :1}                              Medical Decision Making Amount and/or Complexity of Data Reviewed Labs: ordered. Radiology: ordered.  Risk Prescription drug management.   Consult: 23: 00 review to Dr. Sheliah Hatch general surgery.  Will plan for a medical admission and general surgery will consult.  {Document critical care time when appropriate:1} {Document review of labs and clinical decision tools ie heart score, Chads2Vasc2 etc:1}  {Document your independent review of radiology images, and any outside records:1} {Document your discussion with family members, caretakers, and with consultants:1} {Document social determinants of health affecting pt's care:1} {Document your decision making why or why not admission, treatments were needed:1} Final Clinical Impression(s) / ED Diagnoses Final  diagnoses:  None    Rx / DC Orders ED Discharge Orders     None

## 2023-09-18 ENCOUNTER — Observation Stay (HOSPITAL_COMMUNITY): Payer: Medicare (Managed Care)

## 2023-09-18 DIAGNOSIS — I10 Essential (primary) hypertension: Secondary | ICD-10-CM

## 2023-09-18 DIAGNOSIS — S2241XA Multiple fractures of ribs, right side, initial encounter for closed fracture: Principal | ICD-10-CM

## 2023-09-18 DIAGNOSIS — Z992 Dependence on renal dialysis: Secondary | ICD-10-CM

## 2023-09-18 DIAGNOSIS — N186 End stage renal disease: Secondary | ICD-10-CM | POA: Diagnosis not present

## 2023-09-18 DIAGNOSIS — W19XXXA Unspecified fall, initial encounter: Secondary | ICD-10-CM

## 2023-09-18 LAB — CBC
HCT: 29.7 % — ABNORMAL LOW (ref 39.0–52.0)
HCT: 29.1 % — ABNORMAL LOW (ref 39.0–52.0)
Hemoglobin: 10 g/dL — ABNORMAL LOW (ref 13.0–17.0)
Hemoglobin: 9.8 g/dL — ABNORMAL LOW (ref 13.0–17.0)
MCH: 31.7 pg (ref 26.0–34.0)
MCH: 31.2 pg (ref 26.0–34.0)
MCHC: 33.7 g/dL (ref 30.0–36.0)
MCHC: 33.7 g/dL (ref 30.0–36.0)
MCV: 94.3 fL (ref 80.0–100.0)
MCV: 92.7 fL (ref 80.0–100.0)
Platelets: 53 10*3/uL — ABNORMAL LOW (ref 150–400)
Platelets: 50 10*3/uL — ABNORMAL LOW (ref 150–400)
RBC: 3.15 MIL/uL — ABNORMAL LOW (ref 4.22–5.81)
RBC: 3.14 MIL/uL — ABNORMAL LOW (ref 4.22–5.81)
RDW: 13.1 % (ref 11.5–15.5)
RDW: 13.1 % (ref 11.5–15.5)
WBC: 5.1 10*3/uL (ref 4.0–10.5)
WBC: 4.5 10*3/uL (ref 4.0–10.5)
nRBC: 0 % (ref 0.0–0.2)
nRBC: 0 % (ref 0.0–0.2)

## 2023-09-18 LAB — RENAL FUNCTION PANEL
Albumin: 3.6 g/dL (ref 3.5–5.0)
Anion gap: 16 — ABNORMAL HIGH (ref 5–15)
BUN: 78 mg/dL — ABNORMAL HIGH (ref 6–20)
CO2: 21 mmol/L — ABNORMAL LOW (ref 22–32)
Calcium: 9.4 mg/dL (ref 8.9–10.3)
Chloride: 99 mmol/L (ref 98–111)
Creatinine, Ser: 15.54 mg/dL — ABNORMAL HIGH (ref 0.61–1.24)
GFR, Estimated: 3 mL/min — ABNORMAL LOW (ref >60)
Glucose, Bld: 109 mg/dL — ABNORMAL HIGH (ref 70–99)
Phosphorus: 5.7 mg/dL — ABNORMAL HIGH (ref 2.5–4.6)
Potassium: 5.5 mmol/L — ABNORMAL HIGH (ref 3.5–5.1)
Sodium: 136 mmol/L (ref 135–145)

## 2023-09-18 LAB — HEPATITIS B SURFACE ANTIGEN: Hepatitis B Surface Ag: NONREACTIVE

## 2023-09-18 LAB — TSH: TSH: 0.557 u[IU]/mL (ref 0.350–4.500)

## 2023-09-18 MED ORDER — ANTICOAGULANT SODIUM CITRATE 4% (200MG/5ML) IV SOLN: 5.0000 mL | Status: DC | PRN

## 2023-09-18 MED ORDER — FLUTICASONE PROPIONATE 50 MCG/ACT NA SUSP: 2.0000 | Freq: Every day | NASAL | Status: DC | PRN

## 2023-09-18 MED ORDER — FERRIC CITRATE 1 GM 210 MG(FE) PO TABS
210.0000 mg | ORAL_TABLET | Freq: Three times a day (TID) | ORAL | Status: DC
  Filled 2023-09-18 (×2): qty 1

## 2023-09-18 MED ORDER — CHLORHEXIDINE GLUCONATE CLOTH 2 % EX PADS
6.0000 | MEDICATED_PAD | Freq: Every day | CUTANEOUS | Status: DC
  Administered 2023-09-18 – 2023-09-19 (×2): 6 via TOPICAL

## 2023-09-18 MED ORDER — MIDODRINE HCL 5 MG PO TABS
10.0000 mg | ORAL_TABLET | Freq: Once | ORAL | Status: AC
  Administered 2023-09-18: 10 mg via ORAL
  Filled 2023-09-18: qty 2

## 2023-09-18 MED ORDER — MELATONIN 5 MG PO TABS
5.0000 mg | ORAL_TABLET | Freq: Every evening | ORAL | Status: DC | PRN
  Administered 2023-09-18: 5 mg via ORAL
  Filled 2023-09-18: qty 1

## 2023-09-18 MED ORDER — METHOCARBAMOL 500 MG PO TABS
500.0000 mg | ORAL_TABLET | Freq: Four times a day (QID) | ORAL | Status: DC | PRN
  Administered 2023-09-18: 500 mg via ORAL

## 2023-09-18 MED ORDER — PENTAFLUOROPROP-TETRAFLUOROETH EX AERO: 1.0000 | INHALATION_SPRAY | CUTANEOUS | Status: DC | PRN

## 2023-09-18 MED ORDER — LIDOCAINE-PRILOCAINE 2.5-2.5 % EX CREA: 1.0000 | TOPICAL_CREAM | CUTANEOUS | Status: DC | PRN

## 2023-09-18 MED ORDER — POLYETHYLENE GLYCOL 3350 17 G PO PACK: 17.0000 g | PACK | Freq: Every day | ORAL | Status: DC | PRN

## 2023-09-18 MED ORDER — PANTOPRAZOLE SODIUM 40 MG PO TBEC
40.0000 mg | DELAYED_RELEASE_TABLET | Freq: Two times a day (BID) | ORAL | Status: DC
  Filled 2023-09-18 (×3): qty 1

## 2023-09-18 MED ORDER — RENA-VITE PO TABS
1.0000 | ORAL_TABLET | Freq: Every day | ORAL | Status: DC
Start: 2023-09-18 — End: 2023-09-19
  Administered 2023-09-18: 1 via ORAL
  Filled 2023-09-18: qty 1

## 2023-09-18 MED ORDER — HEPARIN SODIUM (PORCINE) 1000 UNIT/ML DIALYSIS: 1000.0000 [IU] | INTRAMUSCULAR | Status: DC | PRN

## 2023-09-18 MED ORDER — HYDROMORPHONE HCL 1 MG/ML IJ SOLN: 0.5000 mg | INTRAMUSCULAR | Status: DC | PRN

## 2023-09-18 MED ORDER — ALTEPLASE 2 MG IJ SOLR: 2.0000 mg | Freq: Once | INTRAMUSCULAR | Status: DC | PRN

## 2023-09-18 MED ORDER — CINACALCET HCL 30 MG PO TABS: 30.0000 mg | ORAL_TABLET | Freq: Every evening | ORAL | Status: DC

## 2023-09-18 MED ORDER — LIDOCAINE HCL (PF) 1 % IJ SOLN: 5.0000 mL | INTRAMUSCULAR | Status: DC | PRN

## 2023-09-18 MED ORDER — OXYCODONE HCL 5 MG PO TABS
5.0000 mg | ORAL_TABLET | Freq: Four times a day (QID) | ORAL | Status: DC | PRN
  Administered 2023-09-18 – 2023-09-19 (×4): 5 mg via ORAL
  Filled 2023-09-18 (×4): qty 1

## 2023-09-18 MED ORDER — ACETAMINOPHEN 325 MG PO TABS
650.0000 mg | ORAL_TABLET | Freq: Four times a day (QID) | ORAL | Status: DC | PRN
  Administered 2023-09-18 (×2): 650 mg via ORAL
  Filled 2023-09-18 (×2): qty 2

## 2023-09-18 MED ORDER — PROCHLORPERAZINE EDISYLATE 10 MG/2ML IJ SOLN: 5.0000 mg | Freq: Four times a day (QID) | INTRAMUSCULAR | Status: DC | PRN

## 2023-09-18 MED ORDER — METHOCARBAMOL 500 MG PO TABS
500.0000 mg | ORAL_TABLET | Freq: Four times a day (QID) | ORAL | Status: DC
  Administered 2023-09-18 – 2023-09-19 (×4): 500 mg via ORAL
  Filled 2023-09-18 (×4): qty 1

## 2023-09-18 MED ORDER — ACETAMINOPHEN 500 MG PO TABS
1000.0000 mg | ORAL_TABLET | Freq: Four times a day (QID) | ORAL | Status: DC
  Administered 2023-09-18 – 2023-09-19 (×4): 1000 mg via ORAL
  Filled 2023-09-18 (×4): qty 2

## 2023-09-18 MED ORDER — CALCITRIOL 0.5 MCG PO CAPS
1.0000 ug | ORAL_CAPSULE | ORAL | Status: DC
  Filled 2023-09-18: qty 2

## 2023-09-18 NOTE — Consult Note (Signed)
ESRD Consult Note  Reason for consult: ESRD, provision of dialysis  Assessment/Recommendations:  ESRD  -outpatient HD orders: [facility]. TTS (last outpt HD Wed 11/27-holiday sched).  4 hours.  EDW 77.5 kg.  LUE AVF, 15-gauge.  Flow rates: 450/autoflow 1.5.  F1 80.  2K/2.5 calcium.  Heparin: 5600 units bolus; 1000 units intermittent.  Meds: Calcitriol 1 mcg 3 times weekly -HD on TTS schedule.  HD today.  Will hold heparin for now  Fall Right posterior rib fractures -Per primary service, trauma has seen -inc spir  New onset A-fib-rate controlled -Per primary  Hyperkalemia -HD as above  Volume/ hypertension  -UF as tolerated  Anemia of Chronic Kidney Disease -Hemoglobin 10. Checking Fe panel. Not receiving Fe or ESA outpt -Transfuse PRN for Hgb <7  Secondary Hyperparathyroidism/Hyperphosphatemia - resume home binders if on any    Anthony Sar, MD [facility]  History of Present Illness: [name] [name] is a/an 62 y.o. male with a past medical history of ESRD, polycystic kidney disease, hypertension who presents with fall.  Rolled out of his bed onto hard concrete floor.  Had spasms in his lower back.  In the ER he was found to have 4 right posterior rib fractures.  Was evaluated by trauma and the plan was to admit/observe overnight.  On the ER he did develop new onset A-fib apparently-rate controlled. Patient seen and examined bedside. He does report that he thinks his EDW needs to be raised. Still with ongoing pain at the site of his fracture. Difficulty taking deep breath.   Medications:  Current Facility-Administered Medications  Medication Dose Route Frequency Provider Last Rate Last Admin   acetaminophen (TYLENOL) tablet 650 mg  650 mg Oral Q6H PRN Darlin Drop, DO   650 mg at 09/18/23 0139   cinacalcet (SENSIPAR) tablet 30 mg  30 mg Oral QPM Dow Adolph N, DO       ferric citrate (AURYXIA) tablet 210-630 mg  210-630 mg Oral TID WC Hall, Carole N, DO        fluticasone (FLONASE) 50 MCG/ACT nasal spray 2 spray  2 spray Each Nare Daily PRN Dow Adolph N, DO       HYDROmorphone (DILAUDID) injection 0.5 mg  0.5 mg Intravenous Q4H PRN Hall, Carole N, DO       melatonin tablet 5 mg  5 mg Oral QHS PRN Dow Adolph N, DO       multivitamin (RENA-VIT) tablet 1 tablet  1 tablet Oral QHS Dow Adolph N, DO       oxyCODONE (Oxy IR/ROXICODONE) immediate release tablet 5 mg  5 mg Oral Q6H PRN Dow Adolph N, DO   5 mg at 09/18/23 0139   pantoprazole (PROTONIX) EC tablet 40 mg  40 mg Oral BID Hall, Carole N, DO       polyethylene glycol (MIRALAX / GLYCOLAX) packet 17 g  17 g Oral Daily PRN Dow Adolph N, DO       prochlorperazine (COMPAZINE) injection 5 mg  5 mg Intravenous Q6H PRN Dow Adolph N, DO         ALLERGIES Fentanyl and Vancomycin  MEDICAL HISTORY Past Medical History:  Diagnosis Date   Anemia    Anxiety    situational   DVT (deep venous thrombosis) (HCC)    leg   ESRD (end stage renal disease) (HCC)    TTHSAT [facility]   GERD (gastroesophageal reflux disease)    Hypertension    Leucocytosis    Polycystic kidney  disease    Shortness of breath      SOCIAL HISTORY Social History   Socioeconomic History   Marital status: Single    Spouse name: Not on file   Number of children: Not on file   Years of education: Not on file   Highest education level: Not on file  Occupational History   Not on file  Tobacco Use   Smoking status: Former   Smokeless tobacco: Never   Tobacco comments:    QUIT SMOKING CIGARETTES  IN 11      QUIT SMOKING POT   Vaping Use   Vaping status: Never Used  Substance and Sexual Activity   Alcohol use: No   Drug use: No   Sexual activity: Not on file  Other Topics Concern   Not on file  Social History Narrative   Not on file   Social Determinants of Health   Financial Resource Strain: Low Risk  (05/12/2023)   Received from [facility]   Overall Financial Resource Strain (CARDIA)    Difficulty  of Paying Living Expenses: Not hard at all  Food Insecurity: No Food Insecurity (09/18/2023)   Hunger Vital Sign    Worried About Running Out of Food in the Last Year: Never true    Ran Out of Food in the Last Year: Never true  Transportation Needs: No Transportation Needs (09/18/2023)   PRAPARE - Administrator, Civil Service (Medical): No    Lack of Transportation (Non-Medical): No  Physical Activity: Not on file  Stress: Not on file  Social Connections: Unknown (03/02/2022)   Received from [facility]   Social Network    Social Network: Not on file  Intimate Partner Violence: Not At Risk (09/18/2023)   Humiliation, Afraid, Rape, and Kick questionnaire    Fear of Current or Ex-Partner: No    Emotionally Abused: No    Physically Abused: No    Sexually Abused: No     FAMILY HISTORY Family History  Problem Relation Age of Onset   Polycystic kidney disease Father    Hypertension Father    Polycystic kidney disease Sister      Review of Systems: 12 systems were reviewed and negative except per HPI  Physical Exam: Vitals:   09/18/23 0125 09/18/23 0523  BP: (!) 146/79 113/65  Pulse: 79 77  Resp:    Temp:  97.7 F (36.5 C)  SpO2: 100% 94%   No intake/output data recorded. No intake or output data in the 24 hours ending 09/18/23 0722 General: appears uncomfortable, laying flat in bed HEENT: anicteric sclera, MMM CV: normal rate, no murmurs, no edema Lungs: bilateral chest rise, normal wob, cta b/l Abd: soft, non-tender, non-distended, anterior midline scar Skin: no visible lesions or rashes Psych: alert, engaged, appropriate mood and affect Neuro: normal speech, no gross focal deficits  Dialysis access: LUE AVF aneurysmal +b/t  Test Results Reviewed Lab Results  Component Value Date   NA 136 09/18/2023   K 5.5 (H) 09/18/2023   CL 99 09/18/2023   CO2 21 (L) 09/18/2023   BUN 78 (H) 09/18/2023   CREATININE 15.54 (H) 09/18/2023    CALCIUM 9.4 09/18/2023   ALBUMIN 3.6 09/18/2023   PHOS 5.7 (H) 09/18/2023    I have reviewed relevant outside healthcare records

## 2023-09-18 NOTE — H&P (Addendum)
History and Physical  [name] [name] GNF:[id number] DOB: November 24, 1960 DOA: 09/17/2023  Referring physician: Dr. Donnald Garre, EDP  PCP: Medicine, [facility]  Outpatient Specialists: Nephrology. Patient coming from: Home.  Chief Complaint: Fall  HPI: [name] [name] is a 62 y.o. male with medical history significant for ESRD on HD TTS, hypertension, anemia of chronic disease, GERD, who presents to the ED after a fall at home.  Rolled out of bed onto hard concrete floor.  Complains of spasm to lower back after his fall.  No loss of consciousness.  No neck pain.  In the ED, imaging revealed 4 right posterior rib fractures.  EDP discussed the case with trauma team who recommended observation overnight, if respiratory status is stable can hopefully go home.  Admitted by [facility], hospitalist service, for pain control.  Addendum: While in the ED, the patient developed new onset atrial fibrillation with controlled rates.  ED Course: Temperature 97.6.  Blood pressure 108/74, pulse 70, respiratory 20, O2 saturation 100% on room air.  Review of Systems: Review of systems as noted in the HPI. All other systems reviewed and are negative.   Past Medical History:  Diagnosis Date   Anemia    Anxiety    situational   DVT (deep venous thrombosis) (HCC)    leg   ESRD (end stage renal disease) (HCC)    TTHSAT [facility]   GERD (gastroesophageal reflux disease)    Hypertension    Leucocytosis    Polycystic kidney disease    Shortness of breath    Past Surgical History:  Procedure Laterality Date   A/V FISTULAGRAM Left 12/04/2020   Procedure: A/V FISTULAGRAM;  Surgeon: Leonie Douglas, MD;  Location: [facility] INVASIVE CV LAB;  Service: Cardiovascular;  Laterality: Left;   AV FISTULA PLACEMENT Left 04/05/2014   Procedure: CREATION OF ARTERIOVENOUS (AV) FISTULA ;  Surgeon: Chuck Hint, MD;  Location: [facility] OR;  Service: Vascular;  Laterality: Left;   CENTRAL VENOUS CATHETER INSERTION  04/02/2014    DR Tyson Alias   COLONOSCOPY     FISTULA SUPERFICIALIZATION Left 05/24/2019   Procedure: PLICATION OF ANEURYSM ARTERIOVENOUS FISTULA LEFT ARM;  Surgeon: Maeola Harman, MD;  Location: [facility] OR;  Service: Vascular;  Laterality: Left;   INSERTION OF DIALYSIS CATHETER Right 04/05/2014   Procedure: ULTRASOUND GUIDED INSERTION OF DIALYSIS CATHETER;  Surgeon: Chuck Hint, MD;  Location: [facility] OR;  Service: Vascular;  Laterality: Right;   NOSE SURGERY     REVISON OF ARTERIOVENOUS FISTULA Left 07/19/2019   Procedure: PLICATION OF LEFT ARM ARTERIOVENOUS FISTULA;  Surgeon: Maeola Harman, MD;  Location: [facility] OR;  Service: Vascular;  Laterality: Left;    Social History:  reports that he has quit smoking. He has never used smokeless tobacco. He reports that he does not drink alcohol and does not use drugs.   Allergies  Allergen Reactions   Fentanyl Nausea Only   Vancomycin Other (See Comments)    Red face ? Red Man Syndrome ?    Family History  Problem Relation Age of Onset   Polycystic kidney disease Father    Hypertension Father    Polycystic kidney disease Sister       Prior to Admission medications   Medication Sig Start Date End Date Taking? Authorizing Provider  acetaminophen (TYLENOL) 500 MG tablet Take 500-1,000 mg by mouth every 6 (six) hours as needed for moderate pain.    [provider]  cinacalcet (SENSIPAR) 30 MG tablet Take 30 mg by mouth  every evening.     [provider]  diphenhydramine-acetaminophen (TYLENOL PM) 25-500 MG TABS tablet Take 1 tablet by mouth at bedtime as needed (sleep).    [provider]  ferric citrate (AURYXIA) 1 GM 210 MG(Fe) tablet Take 210-630 mg by mouth 3 (three) times daily with meals. Take 630 mg with each meal and 210 - 420 mg with each snack    [provider]  fluticasone (FLONASE) 50 MCG/ACT nasal spray Place 2 sprays into both nostrils daily as needed for allergies. 12/15/17   [provider]  hydrocortisone cream 1 % Apply 1 application topically daily as needed for itching.    [provider]  multivitamin (RENA-VIT) TABS tablet Take 1 tablet by mouth every morning. 04/10/14   Gerome Apley, PA-C  pantoprazole (PROTONIX) 40 MG tablet Take 40 mg by mouth 2 (two) times daily.    [provider]  Pediatric Multiple Vit-C-FA (CHILDRENS MULTIVITAMIN PO) Take 1 tablet by mouth daily.    [provider]    Physical Exam: BP 108/74   Pulse 70   Temp 97.6 F (36.4 C) (Oral)   Resp 17   Ht 5\' 7"  (1.702 m)   Wt 77.5 kg   SpO2 100%   BMI 26.76 kg/m   General: 62 y.o. year-old male well developed well nourished in no acute distress.  Alert and oriented x3. Cardiovascular: Irregular rate and rhythm with no rubs or gallops.  No thyromegaly or JVD noted.  No lower extremity edema. 2/4 pulses in all 4 extremities. Respiratory: Clear to auscultation with no wheezes or rales. Good inspiratory effort. Abdomen: Soft nontender nondistended with normal bowel sounds x4 quadrants. Muskuloskeletal: No cyanosis, clubbing or edema noted bilaterally Neuro: CN II-XII intact, strength, sensation, reflexes Skin: No ulcerative lesions noted or rashes Psychiatry: Judgement and insight appear normal. Mood is appropriate for condition and setting          Labs on Admission:  Basic Metabolic Panel: Recent Labs  Lab 09/17/23 2204  NA 139  K 5.3*  CL 98  CO2 26  GLUCOSE 120*  BUN 75*  CREATININE 15.19*  CALCIUM 10.1   Liver Function Tests: Recent Labs  Lab 09/17/23 2204  AST 25  ALT 31  ALKPHOS 63  BILITOT 0.6  PROT 6.7  ALBUMIN 4.1   No results for input(s): "LIPASE", "AMYLASE" in the last 168 hours. No results for input(s): "AMMONIA" in the last 168 hours. CBC: Recent Labs  Lab 09/17/23 2204  WBC 4.1  NEUTROABS 3.0  HGB 11.0*  HCT 32.6*  MCV 95.3  PLT 52*   Cardiac Enzymes: No results for input(s): "CKTOTAL", "CKMB", "CKMBINDEX",  "TROPONINI" in the last 168 hours.  BNP (last 3 results) No results for input(s): "BNP" in the last 8760 hours.  ProBNP (last 3 results) No results for input(s): "PROBNP" in the last 8760 hours.  CBG: No results for input(s): "GLUCAP" in the last 168 hours.  Radiological Exams on Admission: CT CHEST ABDOMEN PELVIS WO CONTRAST  Result Date: 09/17/2023 CLINICAL DATA:  Poly trauma, blunt. Fell onto floor from bed. Pain in the posterior thorax and back. EXAM: CT CHEST, ABDOMEN AND PELVIS WITHOUT CONTRAST TECHNIQUE: Multidetector CT imaging of the chest, abdomen and pelvis was performed following the standard protocol without IV contrast. RADIATION DOSE REDUCTION: This exam was performed according to the departmental dose-optimization program which includes automated exposure control, adjustment of the mA and/or kV according to patient size and/or use of iterative reconstruction  technique. COMPARISON:  Chest radiograph 10/27/2020. CT abdomen 08/21/2020. CT chest 09/14/2018. CT abdomen and pelvis 08/12/2018 FINDINGS: CT CHEST FINDINGS Cardiovascular: Normal heart size. No pericardial effusions. Normal caliber thoracic aorta. Calcification of the aorta and coronary arteries. Mediastinum/Nodes: Esophagus is decompressed. No significant lymphadenopathy. Thyroid gland is unremarkable. Lungs/Pleura: Trace right pleural effusion. Moderate emphysematous changes throughout the lungs. Atelectasis or infiltration demonstrated in both lung bases. No pneumothorax. Musculoskeletal: Acute mildly displaced fractures of the right posterior seventh, eighth, ninth, tenth, and eleventh ribs. CT ABDOMEN PELVIS FINDINGS Hepatobiliary: Numerous hepatic cysts consistent with polycystic liver disease. No significant change. No imaging follow-up is indicated. Gallbladder is not visualized, possibly surgically absent or contracted. No bile duct dilatation. Pancreas: Unremarkable. No pancreatic ductal dilatation or surrounding  inflammatory changes. Spleen: Spleen is mildly enlarged.  No focal lesions. Adrenals/Urinary Tract: No adrenal gland nodules. Kidneys have been resected in the interval since the previous study. Bladder is contracted. Stomach/Bowel: Stomach, small bowel, and colon are not abnormally distended. There appears to be partial right colonic resection with ileocolonic anastomosis. No inflammatory changes or obstruction. Vascular/Lymphatic: Aortic atherosclerosis. No enlarged abdominal or pelvic lymph nodes. Reproductive: Prostate is unremarkable. Other: No free air or free fluid in the abdomen. Abdominal wall musculature appears intact. Musculoskeletal: No fracture is seen. IMPRESSION: 1. Multiple right rib fractures with small right pleural effusion. Basilar atelectasis or consolidation in both lung bases. 2. No acute posttraumatic changes demonstrated in the abdomen or pelvis. 3. Polycystic liver disease. 4. Kidneys have been resected. 5. Aortic atherosclerosis. Electronically Signed   By: Burman Nieves M.D.   On: 09/17/2023 22:15    EKG: I independently viewed the EKG done and my findings are as followed: Atrial fibrillation with rate of 85.  Nonspecific ST-T changes.  QTc 476.  Assessment/Plan Present on Admission:  Fall  Principal Problem:   Fall  4 right posterior rib fractures post fall, POA Seen by trauma team Continue pain control  New onset atrial fibrillation, rate controlled CHA2DS2-VASc score 2 Consider starting DOAC when okay with trauma team Currently rate controlled Monitor on telemetry  ESRD on HD TTS Nephrology made aware of patient's admission for hemodialysis while in the hospital. Volume status and electrolytes managed with hemodialysis.  Hypertension, history of BPs are not at goal, elevated transiently Not on oral antihypertensives prior to admission Closely monitor vital signs.  GERD Resume home PPI   Time: 75 minutes.   DVT prophylaxis: SCDs  Code Status:  Full code.  Family Communication: None at bedside.  Disposition Plan: Admitted to telemetry surgical unit.  Consults called: Trauma team, nephrology.  Admission status:   Observation status  Status is: Observation    Darlin Drop MD [facility] Pager [phone]  If 7PM-7AM, please contact night-coverage [email or web address] Password [email or web address]  09/18/2023, 12:19 AM

## 2023-09-18 NOTE — ED Notes (Signed)
I have called 5n and was told to bring yhis pt up

## 2023-09-18 NOTE — Progress Notes (Signed)
Patient admitted after midnight, please see H&P. Here with pain from fall-- has rib fractures-- will work on pain control. Due for HD today Esiquio Boesen DO

## 2023-09-18 NOTE — ED Notes (Signed)
Nurse on 5 nurth said to come on up

## 2023-09-18 NOTE — Plan of Care (Signed)
  Problem: Education: Goal: Knowledge of General Education information will improve Description: Including pain rating scale, medication(s)/side effects and non-pharmacologic comfort measures Outcome: Progressing   Problem: Activity: Goal: Risk for activity intolerance will decrease Outcome: Progressing   Problem: Pain Management: Goal: General experience of comfort will improve Outcome: Progressing

## 2023-09-18 NOTE — ED Notes (Signed)
ED TO INPATIENT HANDOFF REPORT  ED Nurse Name and Phone #: [email or web address] [phone]  S Name/Age/Gender [name] [name] 62 y.o. male Room/Bed: 005C/005C  Code Status   Code Status: Full Code  Home/SNF/Other Home Patient oriented to: self, place, time, and situation Is this baseline? Yes   Triage Complete: Triage complete  Chief Complaint Fall [W19.XXXA]  Triage Note Pt arrives via EMS from  home with a fall out of bed, walked to living room. Pt c/o spasms to lower back after fall. No LOC, no neck pain. Pt alert and oriented x4. Pt is Pt is normally Tues, Thursday, Sat dialysis pt. Last session on Wednesday. VSS. Pt took 100mg  tylenol PTA.    Allergies Allergies  Allergen Reactions   Fentanyl Nausea Only   Vancomycin Other (See Comments)    Red face ? Red Man Syndrome ?    Level of Care/Admitting Diagnosis ED Disposition     ED Disposition  Admit   Condition  --   Comment  Hospital Area: [facility] [[id number]]  Level of Care: Telemetry Surgical [105]  May place patient in observation at [facility] or [facility] if equivalent level of care is available:: Yes  Covid Evaluation: Asymptomatic - no recent exposure (last 10 days) testing not required  Diagnosis: Fall [[id number]]  Admitting Physician: Darlin Drop [[id number]]  Attending Physician: Darlin Drop [[id number]]          B Medical/Surgery History Past Medical History:  Diagnosis Date   Anemia    Anxiety    situational   DVT (deep venous thrombosis) (HCC)    leg   ESRD (end stage renal disease) (HCC)    T[facility]SAT [place]   GERD (gastroesophageal reflux disease)    Hypertension    Leucocytosis    Polycystic kidney disease    Shortness of breath    Past Surgical History:  Procedure Laterality Date   A/V FISTULAGRAM Left 12/04/2020   Procedure: A/V FISTULAGRAM;  Surgeon: Leonie Douglas, MD;  [facility];  Service: Cardiovascular;  Laterality: Left;   AV FISTULA PLACEMENT Left  04/05/2014   Procedure: CREATION OF ARTERIOVENOUS (AV) FISTULA ;  Surgeon: Chuck Hint, MD;  Location: [facility] OR;  Service: Vascular;  Laterality: Left;   CENTRAL VENOUS CATHETER INSERTION  04/02/2014   DR Tyson Alias   COLONOSCOPY     FISTULA SUPERFICIALIZATION Left 05/24/2019   Procedure: PLICATION OF ANEURYSM ARTERIOVENOUS FISTULA LEFT ARM;  Surgeon: Maeola Harman, MD;  Location: [facility] OR;  Service: Vascular;  Laterality: Left;   INSERTION OF DIALYSIS CATHETER Right 04/05/2014   Procedure: ULTRASOUND GUIDED INSERTION OF DIALYSIS CATHETER;  Surgeon: Chuck Hint, MD;  Location: [facility] OR;  Service: Vascular;  Laterality: Right;   NOSE SURGERY     REVISON OF ARTERIOVENOUS FISTULA Left 07/19/2019   Procedure: PLICATION OF LEFT ARM ARTERIOVENOUS FISTULA;  Surgeon: Maeola Harman, MD;  Location: [facility] OR;  Service: Vascular;  Laterality: Left;     A IV Location/Drains/Wounds Patient Lines/Drains/Airways Status     Active Line/Drains/Airways     Name Placement date Placement time Site Days   Peripheral IV 09/17/23 20 G Anterior;Distal;Right;Upper Arm 09/17/23  1750  Arm  1   Fistula / Graft Left Forearm Arteriovenous fistula 04/05/14  1212  Forearm  3453   Hemodialysis Catheter Right 04/05/14  1119  Internal jugular  3453   Incision (Closed) 04/05/14 Neck Right 04/05/14  0950  -- 3453   Incision (Closed) 04/05/14  Arm Left 04/05/14  0950  -- 3453   Incision (Closed) 05/24/19 Arm Left 05/24/19  0901  -- 1578   Incision (Closed) 07/19/19 Arm Left 07/19/19  0719  -- 1522   Wound / Incision (Open or Dehisced) 03/30/19 Laceration Head Anterior 03/30/19  0350  Head  1633            Intake/Output Last 24 hours No intake or output data in the 24 hours ending 09/18/23 0018  Labs/Imaging Results for orders placed or performed during the hospital encounter of 09/17/23 (from the past 48 hour(s))  Comprehensive metabolic panel     Status: Abnormal   Collection Time:  09/17/23 10:04 PM  Result Value Ref Range   Sodium 139 135 - 145 mmol/L   Potassium 5.3 (H) 3.5 - 5.1 mmol/L   Chloride 98 98 - 111 mmol/L   CO2 26 22 - 32 mmol/L   Glucose, Bld 120 (H) 70 - 99 mg/dL    Comment: Glucose reference range applies only to samples taken after fasting for at least 8 hours.   BUN 75 (H) 6 - 20 mg/dL   Creatinine, Ser 14.78 (H) 0.61 - 1.24 mg/dL   Calcium 29.5 8.9 - 62.1 mg/dL   Total Protein 6.7 6.5 - 8.1 g/dL   Albumin 4.1 3.5 - 5.0 g/dL   AST 25 15 - 41 U/L   ALT 31 0 - 44 U/L   Alkaline Phosphatase 63 38 - 126 U/L   Total Bilirubin 0.6 <1.2 mg/dL   GFR, Estimated 3 (L) >60 mL/min    Comment: (NOTE) Calculated using the CKD-EPI Creatinine Equation (2021)    Anion gap 15 5 - 15    Comment: Performed at [facility], [address] [place], [place] [address]  CBC with Differential     Status: Abnormal   Collection Time: 09/17/23 10:04 PM  Result Value Ref Range   WBC 4.1 4.0 - 10.5 K/uL   RBC 3.42 (L) 4.22 - 5.81 MIL/uL   Hemoglobin 11.0 (L) 13.0 - 17.0 g/dL   HCT 78.4 (L) 69.6 - 29.5 %   MCV 95.3 80.0 - 100.0 fL   MCH 32.2 26.0 - 34.0 pg   MCHC 33.7 30.0 - 36.0 g/dL   RDW 28.4 13.2 - 44.0 %   Platelets 52 (L) 150 - 400 K/uL    Comment: Immature Platelet Fraction may be clinically indicated, consider ordering this additional test [id number] REPEATED TO VERIFY PLATELET COUNT CONFIRMED BY SMEAR    nRBC 0.0 0.0 - 0.2 %   Neutrophils Relative % 73 %   Neutro Abs 3.0 1.7 - 7.7 K/uL   Lymphocytes Relative 16 %   Lymphs Abs 0.7 0.7 - 4.0 K/uL   Monocytes Relative 9 %   Monocytes Absolute 0.4 0.1 - 1.0 K/uL   Eosinophils Relative 2 %   Eosinophils Absolute 0.1 0.0 - 0.5 K/uL   Basophils Relative 0 %   Basophils Absolute 0.0 0.0 - 0.1 K/uL   Immature Granulocytes 0 %   Abs Immature Granulocytes 0.01 0.00 - 0.07 K/uL    Comment: Performed at [facility], [address], [place], [place] [address]   CT CHEST ABDOMEN PELVIS WO  CONTRAST  Result Date: 09/17/2023 CLINICAL DATA:  Poly trauma, blunt. Fell onto floor from bed. Pain in the posterior thorax and back. EXAM: CT CHEST, ABDOMEN AND PELVIS WITHOUT CONTRAST TECHNIQUE: Multidetector CT imaging of the chest, abdomen and pelvis was performed following the  standard protocol without IV contrast. RADIATION DOSE REDUCTION: This exam was performed according to the departmental dose-optimization program which includes automated exposure control, adjustment of the mA and/or kV according to patient size and/or use of iterative reconstruction technique. COMPARISON:  Chest radiograph 10/27/2020. CT abdomen 08/21/2020. CT chest 09/14/2018. CT abdomen and pelvis 08/12/2018 FINDINGS: CT CHEST FINDINGS Cardiovascular: Normal heart size. No pericardial effusions. Normal caliber thoracic aorta. Calcification of the aorta and coronary arteries. Mediastinum/Nodes: Esophagus is decompressed. No significant lymphadenopathy. Thyroid gland is unremarkable. Lungs/Pleura: Trace right pleural effusion. Moderate emphysematous changes throughout the lungs. Atelectasis or infiltration demonstrated in both lung bases. No pneumothorax. Musculoskeletal: Acute mildly displaced fractures of the right posterior seventh, eighth, ninth, tenth, and eleventh ribs. CT ABDOMEN PELVIS FINDINGS Hepatobiliary: Numerous hepatic cysts consistent with polycystic liver disease. No significant change. No imaging follow-up is indicated. Gallbladder is not visualized, possibly surgically absent or contracted. No bile duct dilatation. Pancreas: Unremarkable. No pancreatic ductal dilatation or surrounding inflammatory changes. Spleen: Spleen is mildly enlarged.  No focal lesions. Adrenals/Urinary Tract: No adrenal gland nodules. Kidneys have been resected in the interval since the previous study. Bladder is contracted. Stomach/Bowel: Stomach, small bowel, and colon are not abnormally distended. There appears to be partial right colonic  resection with ileocolonic anastomosis. No inflammatory changes or obstruction. Vascular/Lymphatic: Aortic atherosclerosis. No enlarged abdominal or pelvic lymph nodes. Reproductive: Prostate is unremarkable. Other: No free air or free fluid in the abdomen. Abdominal wall musculature appears intact. Musculoskeletal: No fracture is seen. IMPRESSION: 1. Multiple right rib fractures with small right pleural effusion. Basilar atelectasis or consolidation in both lung bases. 2. No acute posttraumatic changes demonstrated in the abdomen or pelvis. 3. Polycystic liver disease. 4. Kidneys have been resected. 5. Aortic atherosclerosis. Electronically Signed   By: Burman Nieves M.D.   On: 09/17/2023 22:15    Pending Labs Unresulted Labs (From admission, onward)     Start     Ordered   09/18/23 0017  HIV Antibody (routine testing w rflx)  (HIV Antibody (Routine testing w reflex) panel)  Once,   R        09/18/23 0016            Vitals/Pain Today's Vitals   09/17/23 2115 09/17/23 2153 09/17/23 2257 09/17/23 2345  BP: 108/74     Pulse: 70     Resp: 20   17  Temp:  97.6 F (36.4 C)    TempSrc:  Oral    SpO2: 100%     Weight:      Height:      PainSc:   10-Worst pain ever     Isolation Precautions No active isolations  Medications Medications  acetaminophen (TYLENOL) tablet 650 mg (has no administration in time range)  prochlorperazine (COMPAZINE) injection 5 mg (has no administration in time range)  polyethylene glycol (MIRALAX / GLYCOLAX) packet 17 g (has no administration in time range)  melatonin tablet 5 mg (has no administration in time range)  oxyCODONE (Oxy IR/ROXICODONE) immediate release tablet 5 mg (has no administration in time range)  HYDROmorphone (DILAUDID) injection 0.5 mg (has no administration in time range)  HYDROmorphone (DILAUDID) injection 0.5 mg (0.5 mg Intravenous Given 09/17/23 2124)  HYDROmorphone (DILAUDID) injection 0.5 mg (0.5 mg Intravenous Given 09/17/23  2311)    Mobility walks     Focused Assessments Cardiac Assessment Handoff:    No results found for: "CKTOTAL", "CKMB", "CKMBINDEX", "TROPONINI" Lab Results  Component Value Date   DDIMER 3.38 (H)  10/29/2020   Does the Patient currently have chest pain? No    R Recommendations: See Admitting Provider Note  Report given to:   Additional Notes:

## 2023-09-18 NOTE — Consult Note (Signed)
Activation and Reason: consult, fall  Primary Survey: airway intact, breath sounds present bilaterally  [name] [name] is an 62 y.o. male.  HPI: 62 yo male rolled out of bed onto a hard concrete floor. He had back pain. It was persistent. It is worse with movement and coughing. He was set to have dialysis at 0500 tomorrow.  Past Medical History:  Diagnosis Date   Anemia    Anxiety    situational   DVT (deep venous thrombosis) (HCC)    leg   ESRD (end stage renal disease) (HCC)    TTHSAT [facility]   GERD (gastroesophageal reflux disease)    Hypertension    Leucocytosis    Polycystic kidney disease    Shortness of breath     Past Surgical History:  Procedure Laterality Date   A/V FISTULAGRAM Left 12/04/2020   Procedure: A/V FISTULAGRAM;  Surgeon: Leonie Douglas, MD;  Location: [facility];  Service: Cardiovascular;  Laterality: Left;   AV FISTULA PLACEMENT Left 04/05/2014   Procedure: CREATION OF ARTERIOVENOUS (AV) FISTULA ;  Surgeon: Chuck Hint, MD;  Location: [facility] OR;  Service: Vascular;  Laterality: Left;   CENTRAL VENOUS CATHETER INSERTION  04/02/2014   DR Tyson Alias   COLONOSCOPY     FISTULA SUPERFICIALIZATION Left 05/24/2019   Procedure: PLICATION OF ANEURYSM ARTERIOVENOUS FISTULA LEFT ARM;  Surgeon: Maeola Harman, MD;  Location: [facility] OR;  Service: Vascular;  Laterality: Left;   INSERTION OF DIALYSIS CATHETER Right 04/05/2014   Procedure: ULTRASOUND GUIDED INSERTION OF DIALYSIS CATHETER;  Surgeon: Chuck Hint, MD;  Location: [facility] OR;  Service: Vascular;  Laterality: Right;   NOSE SURGERY     REVISON OF ARTERIOVENOUS FISTULA Left 07/19/2019   Procedure: PLICATION OF LEFT ARM ARTERIOVENOUS FISTULA;  Surgeon: Maeola Harman, MD;  Location: [facility] OR;  Service: Vascular;  Laterality: Left;    Family History  Problem Relation Age of Onset   Polycystic kidney disease Father    Hypertension Father    Polycystic kidney disease Sister      Social History:  reports that he has quit smoking. He has never used smokeless tobacco. He reports that he does not drink alcohol and does not use drugs.  Allergies:  Allergies  Allergen Reactions   Fentanyl Nausea Only   Vancomycin Other (See Comments)    Red face ? Red Man Syndrome ?    Medications: I have reviewed the patient's current medications.  Results for orders placed or performed during the hospital encounter of 09/17/23 (from the past 48 hour(s))  Comprehensive metabolic panel     Status: Abnormal   Collection Time: 09/17/23 10:04 PM  Result Value Ref Range   Sodium 139 135 - 145 mmol/L   Potassium 5.3 (H) 3.5 - 5.1 mmol/L   Chloride 98 98 - 111 mmol/L   CO2 26 22 - 32 mmol/L   Glucose, Bld 120 (H) 70 - 99 mg/dL    Comment: Glucose reference range applies only to samples taken after fasting for at least 8 hours.   BUN 75 (H) 6 - 20 mg/dL   Creatinine, Ser 16.10 (H) 0.61 - 1.24 mg/dL   Calcium 96.0 8.9 - 45.4 mg/dL   Total Protein 6.7 6.5 - 8.1 g/dL   Albumin 4.1 3.5 - 5.0 g/dL   AST 25 15 - 41 U/L   ALT 31 0 - 44 U/L   Alkaline Phosphatase 63 38 - 126 U/L   Total Bilirubin 0.6 <1.2 mg/dL  GFR, Estimated 3 (L) >60 mL/min    Comment: (NOTE) Calculated using the CKD-EPI Creatinine Equation (2021)    Anion gap 15 5 - 15    Comment: Performed at [facility], [address] [place], [place] [address]  CBC with Differential     Status: Abnormal   Collection Time: 09/17/23 10:04 PM  Result Value Ref Range   WBC 4.1 4.0 - 10.5 K/uL   RBC 3.42 (L) 4.22 - 5.81 MIL/uL   Hemoglobin 11.0 (L) 13.0 - 17.0 g/dL   HCT 56.2 (L) 13.0 - 86.5 %   MCV 95.3 80.0 - 100.0 fL   MCH 32.2 26.0 - 34.0 pg   MCHC 33.7 30.0 - 36.0 g/dL   RDW 78.4 69.6 - 29.5 %   Platelets 52 (L) 150 - 400 K/uL    Comment: Immature Platelet Fraction may be clinically indicated, consider ordering this additional test [id number] REPEATED TO VERIFY PLATELET COUNT CONFIRMED BY SMEAR     nRBC 0.0 0.0 - 0.2 %   Neutrophils Relative % 73 %   Neutro Abs 3.0 1.7 - 7.7 K/uL   Lymphocytes Relative 16 %   Lymphs Abs 0.7 0.7 - 4.0 K/uL   Monocytes Relative 9 %   Monocytes Absolute 0.4 0.1 - 1.0 K/uL   Eosinophils Relative 2 %   Eosinophils Absolute 0.1 0.0 - 0.5 K/uL   Basophils Relative 0 %   Basophils Absolute 0.0 0.0 - 0.1 K/uL   Immature Granulocytes 0 %   Abs Immature Granulocytes 0.01 0.00 - 0.07 K/uL    Comment: Performed at [facility], [address], [place], [place] [address]    CT CHEST ABDOMEN PELVIS WO CONTRAST  Result Date: 09/17/2023 CLINICAL DATA:  Poly trauma, blunt. Fell onto floor from bed. Pain in the posterior thorax and back. EXAM: CT CHEST, ABDOMEN AND PELVIS WITHOUT CONTRAST TECHNIQUE: Multidetector CT imaging of the chest, abdomen and pelvis was performed following the standard protocol without IV contrast. RADIATION DOSE REDUCTION: This exam was performed according to the departmental dose-optimization program which includes automated exposure control, adjustment of the mA and/or kV according to patient size and/or use of iterative reconstruction technique. COMPARISON:  Chest radiograph 10/27/2020. CT abdomen 08/21/2020. CT chest 09/14/2018. CT abdomen and pelvis 08/12/2018 FINDINGS: CT CHEST FINDINGS Cardiovascular: Normal heart size. No pericardial effusions. Normal caliber thoracic aorta. Calcification of the aorta and coronary arteries. Mediastinum/Nodes: Esophagus is decompressed. No significant lymphadenopathy. Thyroid gland is unremarkable. Lungs/Pleura: Trace right pleural effusion. Moderate emphysematous changes throughout the lungs. Atelectasis or infiltration demonstrated in both lung bases. No pneumothorax. Musculoskeletal: Acute mildly displaced fractures of the right posterior seventh, eighth, ninth, tenth, and eleventh ribs. CT ABDOMEN PELVIS FINDINGS Hepatobiliary: Numerous hepatic cysts consistent with polycystic liver disease. No  significant change. No imaging follow-up is indicated. Gallbladder is not visualized, possibly surgically absent or contracted. No bile duct dilatation. Pancreas: Unremarkable. No pancreatic ductal dilatation or surrounding inflammatory changes. Spleen: Spleen is mildly enlarged.  No focal lesions. Adrenals/Urinary Tract: No adrenal gland nodules. Kidneys have been resected in the interval since the previous study. Bladder is contracted. Stomach/Bowel: Stomach, small bowel, and colon are not abnormally distended. There appears to be partial right colonic resection with ileocolonic anastomosis. No inflammatory changes or obstruction. Vascular/Lymphatic: Aortic atherosclerosis. No enlarged abdominal or pelvic lymph nodes. Reproductive: Prostate is unremarkable. Other: No free air or free fluid in the abdomen. Abdominal wall musculature appears intact. Musculoskeletal: No fracture is seen. IMPRESSION: 1.  Multiple right rib fractures with small right pleural effusion. Basilar atelectasis or consolidation in both lung bases. 2. No acute posttraumatic changes demonstrated in the abdomen or pelvis. 3. Polycystic liver disease. 4. Kidneys have been resected. 5. Aortic atherosclerosis. Electronically Signed   By: Burman Nieves M.D.   On: 09/17/2023 22:15    ROS  PE Blood pressure 108/74, pulse 70, temperature 97.6 F (36.4 C), temperature source Oral, resp. rate 20, height 5\' 7"  (1.702 m), weight 77.5 kg, SpO2 100%. Constitutional: NAD; conversant; no deformities Eyes: Moist conjunctiva; no lid lag; anicteric; PERRL Neck: Trachea midline; no thyromegaly, no cervicalgia Lungs: Normal respiratory effort; no tactile fremitus CV: RRR; no palpable thrills; no pitting edema GI: Abd soft, NT, NT; no palpable hepatosplenomegaly MSK: unable to assess gait; no clubbing/cyanosis Psychiatric: Appropriate affect; alert and oriented x3 Lymphatic: No palpable cervical or axillary lymphadenopathy   Assessment/Plan: 62  yo male with ESRD and multiple medical problems with 4 right posterior rib fractures -observe overnight -HD in the morning -if respiratory status is stable hopefully home  Procedures: none  I reviewed last 24 h vitals and pain scores, last 48 h intake and output, last 24 h labs and trends, and last 24 h imaging results.  This care required high  level of medical decision making.   De Blanch Volney Reierson 09/18/2023, 12:15 AM

## 2023-09-18 NOTE — Progress Notes (Signed)
Subjective/Chief Complaint: Patient breathing okay.  Does have pain with deep inspiration.  Having back spasms as well.   Objective: Vital signs in last 24 hours: Temp:  [97.5 F (36.4 C)-97.7 F (36.5 C)] 97.7 F (36.5 C) (11/30 0523) Pulse Rate:  [64-79] 77 (11/30 0523) Resp:  [15-24] 17 (11/29 2345) BP: (104-146)/(62-79) 113/65 (11/30 0523) SpO2:  [94 %-100 %] 94 % (11/30 0523) Weight:  [77.5 kg] 77.5 kg (11/29 1748) Last BM Date : 09/17/23 (Friday morning per pt)  Intake/Output from previous day: No intake/output data recorded. Intake/Output this shift: No intake/output data recorded.  Pulmonary: Work of breathing normal.  No chest retraction.  No evidence of flail segment.  Tender over right lower costal margin.  No wheezing  Lab Results:  Recent Labs    09/17/23 2204 09/18/23 0314  WBC 4.1 5.1  HGB 11.0* 10.0*  HCT 32.6* 29.7*  PLT 52* 53*   BMET Recent Labs    09/17/23 2204 09/18/23 0315  NA 139 136  K 5.3* 5.5*  CL 98 99  CO2 26 21*  GLUCOSE 120* 109*  BUN 75* 78*  CREATININE 15.19* 15.54*  CALCIUM 10.1 9.4   PT/INR No results for input(s): "LABPROT", "INR" in the last 72 hours. ABG No results for input(s): "PHART", "HCO3" in the last 72 hours.  Invalid input(s): "PCO2", "PO2"  Studies/Results: CT CHEST ABDOMEN PELVIS WO CONTRAST  Result Date: 09/17/2023 CLINICAL DATA:  Poly trauma, blunt. Fell onto floor from bed. Pain in the posterior thorax and back. EXAM: CT CHEST, ABDOMEN AND PELVIS WITHOUT CONTRAST TECHNIQUE: Multidetector CT imaging of the chest, abdomen and pelvis was performed following the standard protocol without IV contrast. RADIATION DOSE REDUCTION: This exam was performed according to the departmental dose-optimization program which includes automated exposure control, adjustment of the mA and/or kV according to patient size and/or use of iterative reconstruction technique. COMPARISON:  Chest radiograph 10/27/2020. CT abdomen  08/21/2020. CT chest 09/14/2018. CT abdomen and pelvis 08/12/2018 FINDINGS: CT CHEST FINDINGS Cardiovascular: Normal heart size. No pericardial effusions. Normal caliber thoracic aorta. Calcification of the aorta and coronary arteries. Mediastinum/Nodes: Esophagus is decompressed. No significant lymphadenopathy. Thyroid gland is unremarkable. Lungs/Pleura: Trace right pleural effusion. Moderate emphysematous changes throughout the lungs. Atelectasis or infiltration demonstrated in both lung bases. No pneumothorax. Musculoskeletal: Acute mildly displaced fractures of the right posterior seventh, eighth, ninth, tenth, and eleventh ribs. CT ABDOMEN PELVIS FINDINGS Hepatobiliary: Numerous hepatic cysts consistent with polycystic liver disease. No significant change. No imaging follow-up is indicated. Gallbladder is not visualized, possibly surgically absent or contracted. No bile duct dilatation. Pancreas: Unremarkable. No pancreatic ductal dilatation or surrounding inflammatory changes. Spleen: Spleen is mildly enlarged.  No focal lesions. Adrenals/Urinary Tract: No adrenal gland nodules. Kidneys have been resected in the interval since the previous study. Bladder is contracted. Stomach/Bowel: Stomach, small bowel, and colon are not abnormally distended. There appears to be partial right colonic resection with ileocolonic anastomosis. No inflammatory changes or obstruction. Vascular/Lymphatic: Aortic atherosclerosis. No enlarged abdominal or pelvic lymph nodes. Reproductive: Prostate is unremarkable. Other: No free air or free fluid in the abdomen. Abdominal wall musculature appears intact. Musculoskeletal: No fracture is seen. IMPRESSION: 1. Multiple right rib fractures with small right pleural effusion. Basilar atelectasis or consolidation in both lung bases. 2. No acute posttraumatic changes demonstrated in the abdomen or pelvis. 3. Polycystic liver disease. 4. Kidneys have been resected. 5. Aortic atherosclerosis.  Electronically Signed   By: Burman Nieves M.D.   On: 09/17/2023  22:15    Anti-infectives: Anti-infectives (From admission, onward)    None       Assessment/Plan: 62 year old male status post fall from bed with right rib fractures posterior 8th through 12th minimally displaced  Pulmonary status stable  Recommend combined muscle relaxant/narcotic pain control.  Add Robaxin out of milligrams p.o. every 6 as needed with oxycodone 5 mg p.o. every 4 as needed.  Stable for discharge from surgery standpoint.  The big issue for him will be pain control and mobility at home.  Recommend incentive spirometry.  This has been ordered.  Trauma service available as needed otherwise from our standpoint he is cleared discharged once deemed medically fit.   LOS: 0 days    Clovis Pu Jd Mccaster 09/18/2023 Moderate complexity

## 2023-09-19 ENCOUNTER — Observation Stay (HOSPITAL_BASED_OUTPATIENT_CLINIC_OR_DEPARTMENT_OTHER): Payer: Medicare (Managed Care)

## 2023-09-19 DIAGNOSIS — Z992 Dependence on renal dialysis: Secondary | ICD-10-CM | POA: Diagnosis not present

## 2023-09-19 DIAGNOSIS — R9431 Abnormal electrocardiogram [ECG] [EKG]: Secondary | ICD-10-CM

## 2023-09-19 DIAGNOSIS — N186 End stage renal disease: Secondary | ICD-10-CM | POA: Diagnosis not present

## 2023-09-19 DIAGNOSIS — S2241XA Multiple fractures of ribs, right side, initial encounter for closed fracture: Secondary | ICD-10-CM | POA: Diagnosis not present

## 2023-09-19 DIAGNOSIS — D631 Anemia in chronic kidney disease: Secondary | ICD-10-CM | POA: Diagnosis not present

## 2023-09-19 LAB — ECHOCARDIOGRAM COMPLETE
Area-P 1/2: 3.72 cm2
Height: 67 in
S' Lateral: 3.1 cm
Weight: 2723.12 [oz_av]

## 2023-09-19 LAB — CBC
HCT: 34 % — ABNORMAL LOW (ref 39.0–52.0)
Hemoglobin: 11.6 g/dL — ABNORMAL LOW (ref 13.0–17.0)
MCH: 31.9 pg (ref 26.0–34.0)
MCHC: 34.1 g/dL (ref 30.0–36.0)
MCV: 93.4 fL (ref 80.0–100.0)
Platelets: 65 10*3/uL — ABNORMAL LOW (ref 150–400)
RBC: 3.64 MIL/uL — ABNORMAL LOW (ref 4.22–5.81)
RDW: 13 % (ref 11.5–15.5)
WBC: 5.5 10*3/uL (ref 4.0–10.5)
nRBC: 0 % (ref 0.0–0.2)

## 2023-09-19 LAB — HIV ANTIBODY (ROUTINE TESTING W REFLEX): HIV Screen 4th Generation wRfx: NONREACTIVE

## 2023-09-19 MED ORDER — PHENOL 1.4 % MT LIQD: 1.0000 | OROMUCOSAL | Status: DC | PRN

## 2023-09-19 MED ORDER — SEVELAMER CARBONATE 800 MG PO TABS: 2400.0000 mg | ORAL_TABLET | Freq: Three times a day (TID) | ORAL | Status: DC

## 2023-09-19 MED ORDER — POLYETHYLENE GLYCOL 3350 17 G PO PACK: 17.0000 g | PACK | Freq: Every day | ORAL | Status: AC | PRN

## 2023-09-19 MED ORDER — OXYCODONE HCL 5 MG PO TABS: 5.0000 mg | ORAL_TABLET | Freq: Four times a day (QID) | ORAL | 0 refills | Status: AC | PRN

## 2023-09-19 MED ORDER — TIZANIDINE HCL 4 MG PO TABS: 4.0000 mg | ORAL_TABLET | Freq: Three times a day (TID) | ORAL | 0 refills | Status: AC

## 2023-09-19 MED ORDER — ACETAMINOPHEN 500 MG PO TABS: 1000.0000 mg | ORAL_TABLET | Freq: Four times a day (QID) | ORAL | Status: AC

## 2023-09-19 MED ORDER — MIDODRINE HCL 5 MG PO TABS: 30.0000 mg | ORAL_TABLET | ORAL | Status: DC | PRN

## 2023-09-19 NOTE — Progress Notes (Signed)
[facility] Progress Note    Assessment/ Plan:   ESRD  -outpatient HD orders: [facility]. TTS (last outpt HD Wed 11/27-holiday sched).  4 hours.  EDW 77.5 kg.  LUE AVF, 15-gauge.  Flow rates: 450/autoflow 1.5.  F1 80.  2K/2.5 calcium.  Heparin: 5600 units bolus; 1000 units intermittent.  Meds: Calcitriol 1 mcg 3 times weekly -HD on TTS schedule.  HD Tues. With midodrine 30mg  as support   Fall Right posterior rib fractures -Per primary service, trauma has seen -inc spir   New onset A-fib-rate controlled -Per primary   Hyperkalemia -HD as above, +renal diet   Volume/ hypertension  -UF as tolerated   Anemia of Chronic Kidney Disease -Hemoglobin 11.6-@ goal -Transfuse PRN for Hgb <7   Secondary Hyperparathyroidism/Hyperphosphatemia - doesn't like auryxia. Takes renvela 3tabs tidac at home--will resume. Po4 acceptable  Subjective:   Patient seen and examined bedside.  No acute events overnight.  Tolerated dialysis yesterday with net UF 2.1 L.  Once his binders changed to Renvela which he takes at home.  Still with ongoing rib pain.   Objective:   BP 97/61 (BP Location: Right Arm)   Pulse 70   Temp 98.1 F (36.7 C) (Oral)   Resp 18   Ht 5\' 7"  (1.702 m)   Wt 77.2 kg   SpO2 94%   BMI 26.66 kg/m   Intake/Output Summary (Last 24 hours) at 09/19/2023 0946 Last data filed at 09/18/2023 1837 Gross per 24 hour  Intake --  Output 2100 ml  Net -2100 ml   Weight change: 1.8 kg  Physical Exam: Gen: NAD CVS: RRR Resp: CTA B/L Abd: ND Ext: no edema Neuro: awake, alert Dialysis access: LUE AVF +b/t  Imaging: CT CHEST ABDOMEN PELVIS WO CONTRAST  Result Date: 09/17/2023 CLINICAL DATA:  Poly trauma, blunt. Fell onto floor from bed. Pain in the posterior thorax and back. EXAM: CT CHEST, ABDOMEN AND PELVIS WITHOUT CONTRAST TECHNIQUE: Multidetector CT imaging of the chest, abdomen and pelvis was performed following the standard protocol without IV contrast.  RADIATION DOSE REDUCTION: This exam was performed according to the departmental dose-optimization program which includes automated exposure control, adjustment of the mA and/or kV according to patient size and/or use of iterative reconstruction technique. COMPARISON:  Chest radiograph 10/27/2020. CT abdomen 08/21/2020. CT chest 09/14/2018. CT abdomen and pelvis 08/12/2018 FINDINGS: CT CHEST FINDINGS Cardiovascular: Normal heart size. No pericardial effusions. Normal caliber thoracic aorta. Calcification of the aorta and coronary arteries. Mediastinum/Nodes: Esophagus is decompressed. No significant lymphadenopathy. Thyroid gland is unremarkable. Lungs/Pleura: Trace right pleural effusion. Moderate emphysematous changes throughout the lungs. Atelectasis or infiltration demonstrated in both lung bases. No pneumothorax. Musculoskeletal: Acute mildly displaced fractures of the right posterior seventh, eighth, ninth, tenth, and eleventh ribs. CT ABDOMEN PELVIS FINDINGS Hepatobiliary: Numerous hepatic cysts consistent with polycystic liver disease. No significant change. No imaging follow-up is indicated. Gallbladder is not visualized, possibly surgically absent or contracted. No bile duct dilatation. Pancreas: Unremarkable. No pancreatic ductal dilatation or surrounding inflammatory changes. Spleen: Spleen is mildly enlarged.  No focal lesions. Adrenals/Urinary Tract: No adrenal gland nodules. Kidneys have been resected in the interval since the previous study. Bladder is contracted. Stomach/Bowel: Stomach, small bowel, and colon are not abnormally distended. There appears to be partial right colonic resection with ileocolonic anastomosis. No inflammatory changes or obstruction. Vascular/Lymphatic: Aortic atherosclerosis. No enlarged abdominal or pelvic lymph nodes. Reproductive: Prostate is unremarkable. Other: No free air or free fluid in the abdomen. Abdominal wall musculature  appears intact. Musculoskeletal: No  fracture is seen. IMPRESSION: 1. Multiple right rib fractures with small right pleural effusion. Basilar atelectasis or consolidation in both lung bases. 2. No acute posttraumatic changes demonstrated in the abdomen or pelvis. 3. Polycystic liver disease. 4. Kidneys have been resected. 5. Aortic atherosclerosis. Electronically Signed   By: Burman Nieves M.D.   On: 09/17/2023 22:15    Labs: BMET Recent Labs  Lab 09/17/23 2204 09/18/23 0315  NA 139 136  K 5.3* 5.5*  CL 98 99  CO2 26 21*  GLUCOSE 120* 109*  BUN 75* 78*  CREATININE 15.19* 15.54*  CALCIUM 10.1 9.4  PHOS  --  5.7*   CBC Recent Labs  Lab 09/17/23 2204 09/18/23 0314 09/18/23 1106 09/19/23 0709  WBC 4.1 5.1 4.5 5.5  NEUTROABS 3.0  --   --   --   HGB 11.0* 10.0* 9.8* 11.6*  HCT 32.6* 29.7* 29.1* 34.0*  MCV 95.3 94.3 92.7 93.4  PLT 52* 53* 50* 65*    Medications:     acetaminophen  1,000 mg Oral QID   calcitRIOL  1 mcg Oral Q T,Th,Sa-HD   Chlorhexidine Gluconate Cloth  6 each Topical Q0600   cinacalcet  30 mg Oral QPM   methocarbamol  500 mg Oral QID   multivitamin  1 tablet Oral QHS   pantoprazole  40 mg Oral BID   sevelamer carbonate  2,400 mg Oral TID WC      Anthony Sar, MD [facility] 09/19/2023, 9:46 AM

## 2023-09-19 NOTE — Progress Notes (Signed)
   09/19/23 1102  Mobility  Activity Ambulated with assistance in hallway  Level of Assistance Contact guard assist, steadying assist  Assistive Device Other (Comment) (Walls. doorframes)  Distance Ambulated (ft) 350 ft  Activity Response Tolerated well  Mobility Referral Yes  $Mobility charge 1 Mobility  Mobility Specialist Start Time (ACUTE ONLY) 1042  Mobility Specialist Stop Time (ACUTE ONLY) 1102  Mobility Specialist Time Calculation (min) (ACUTE ONLY) 20 min   Mobility Specialist: Progress Note  During Mobility: SpO2 86-88 RA (for second half of session). Post-Mobility: SpO2 92% RA  Pt agreeable to mobility session - received in standing in room. Required CG using walls/ doorframes with 6 standing rest breaks. C/o rib pain and back spasms, pt with SOB halfway through session. Despite verbal cues given, pt not taking a standing rest break long enough to ensure SPO2 recovery. VSS, EOS. Returned to chair with all needs met - call bell within reach. Chair alarm not on d/t RN approval.  Barnie Mort, BS Mobility Specialist Please contact via SecureChat or Rehab office at [phone]. Marland Kitchen

## 2023-09-19 NOTE — Progress Notes (Signed)
Patient got out of bed on own and is walking in the hallway. JV

## 2023-09-19 NOTE — Care Management Obs Status (Signed)
MEDICARE OBSERVATION STATUS NOTIFICATION   Patient Details  Name: [name] [name] MRN: [id number] Date of Birth: [birth year]   Medicare Observation Status Notification Given:  Yes    Ronny Bacon, RN 09/19/2023, 7:32 AM

## 2023-09-19 NOTE — Plan of Care (Signed)
  Problem: Activity: Goal: Risk for activity intolerance will decrease Outcome: Progressing   Problem: Nutrition: Goal: Adequate nutrition will be maintained Outcome: Progressing   Problem: Elimination: Goal: Will not experience complications related to bowel motility Outcome: Progressing   

## 2023-09-19 NOTE — Progress Notes (Signed)
  Echocardiogram 2D Echocardiogram has been performed.  Delcie Roch 09/19/2023, 8:33 AM

## 2023-09-19 NOTE — Discharge Summary (Signed)
Physician Discharge Summary  [name] [name] UJW:[id number] DOB: 1961/06/24 DOA: 09/17/2023  PCP: Medicine, [facility]  Admit date: 09/17/2023 Discharge date: 09/19/2023  Admitted From: home Discharge disposition: home   Recommendations for Outpatient Follow-Up:   Incentive spirometry   Discharge Diagnosis:   Principal Problem:   Fall    Discharge Condition: Improved.  Diet recommendation: Regular.  Wound care: None.  Code status: Full.   History of Present Illness:   [name] [name] is a 62 y.o. male with medical history significant for ESRD on HD TTS, hypertension, anemia of chronic disease, GERD, who presents to the ED after a fall at home.  Rolled out of bed onto hard concrete floor.  Complains of spasm to lower back after his fall.  No loss of consciousness.  No neck pain.   In the ED, imaging revealed 4 right posterior rib fractures.  EDP discussed the case with trauma team who recommended observation overnight, if respiratory status is stable can hopefully go home.  Ad[facility], hospitalist service, for pain control.     Hospital Course by Problem:   Right rib fractures  Stable for D/C  no follow up needed from trauma service  -pain meds -incentive spirometry  Doubt a fib -no sign on tele -echo done -outpatient follow up  ESRD -HD     Medical Consultants:   Renal trauma   Discharge Exam:   Vitals:   09/19/23 0415 09/19/23 0739  BP: (!) 118/59 97/61  Pulse: 81 70  Resp: 18 18  Temp: 97.9 F (36.6 C) 98.1 F (36.7 C)  SpO2: 92% 94%   Vitals:   09/18/23 1837 09/18/23 2031 09/19/23 0415 09/19/23 0739  BP: 91/65 99/68 (!) 118/59 97/61  Pulse: 69 76 81 70  Resp: 16 18 18 18   Temp: (!) 97.4 F (36.3 C) 97.8 F (36.6 C) 97.9 F (36.6 C) 98.1 F (36.7 C)  TempSrc:  Oral Oral Oral  SpO2: 94% 93% 92% 94%  Weight: 77.2 kg     Height:        General exam: Appears calm and comfortable   The results of  significant diagnostics from this hospitalization (including imaging, microbiology, ancillary and laboratory) are listed below for reference.     Procedures and Diagnostic Studies:   CT CHEST ABDOMEN PELVIS WO CONTRAST  Result Date: 09/17/2023 CLINICAL DATA:  Poly trauma, blunt. Fell onto floor from bed. Pain in the posterior thorax and back. EXAM: CT CHEST, ABDOMEN AND PELVIS WITHOUT CONTRAST TECHNIQUE: Multidetector CT imaging of the chest, abdomen and pelvis was performed following the standard protocol without IV contrast. RADIATION DOSE REDUCTION: This exam was performed according to the departmental dose-optimization program which includes automated exposure control, adjustment of the mA and/or kV according to patient size and/or use of iterative reconstruction technique. COMPARISON:  Chest radiograph 10/27/2020. CT abdomen 08/21/2020. CT chest 09/14/2018. CT abdomen and pelvis 08/12/2018 FINDINGS: CT CHEST FINDINGS Cardiovascular: Normal heart size. No pericardial effusions. Normal caliber thoracic aorta. Calcification of the aorta and coronary arteries. Mediastinum/Nodes: Esophagus is decompressed. No significant lymphadenopathy. Thyroid gland is unremarkable. Lungs/Pleura: Trace right pleural effusion. Moderate emphysematous changes throughout the lungs. Atelectasis or infiltration demonstrated in both lung bases. No pneumothorax. Musculoskeletal: Acute mildly displaced fractures of the right posterior seventh, eighth, ninth, tenth, and eleventh ribs. CT ABDOMEN PELVIS FINDINGS Hepatobiliary: Numerous hepatic cysts consistent with polycystic liver disease. No significant change. No imaging follow-up is indicated. Gallbladder is not visualized, possibly surgically  absent or contracted. No bile duct dilatation. Pancreas: Unremarkable. No pancreatic ductal dilatation or surrounding inflammatory changes. Spleen: Spleen is mildly enlarged.  No focal lesions. Adrenals/Urinary Tract: No adrenal gland  nodules. Kidneys have been resected in the interval since the previous study. Bladder is contracted. Stomach/Bowel: Stomach, small bowel, and colon are not abnormally distended. There appears to be partial right colonic resection with ileocolonic anastomosis. No inflammatory changes or obstruction. Vascular/Lymphatic: Aortic atherosclerosis. No enlarged abdominal or pelvic lymph nodes. Reproductive: Prostate is unremarkable. Other: No free air or free fluid in the abdomen. Abdominal wall musculature appears intact. Musculoskeletal: No fracture is seen. IMPRESSION: 1. Multiple right rib fractures with small right pleural effusion. Basilar atelectasis or consolidation in both lung bases. 2. No acute posttraumatic changes demonstrated in the abdomen or pelvis. 3. Polycystic liver disease. 4. Kidneys have been resected. 5. Aortic atherosclerosis. Electronically Signed   By: Burman Nieves M.D.   On: 09/17/2023 22:15     Labs:   Basic Metabolic Panel: Recent Labs  Lab 09/17/23 2204 09/18/23 0315  NA 139 136  K 5.3* 5.5*  CL 98 99  CO2 26 21*  GLUCOSE 120* 109*  BUN 75* 78*  CREATININE 15.19* 15.54*  CALCIUM 10.1 9.4  PHOS  --  5.7*   GFR Estimated Creatinine Clearance: 4.7 mL/min (A) (by C-G formula based on SCr of 15.54 mg/dL (H)). Liver Function Tests: Recent Labs  Lab 09/17/23 2204 09/18/23 0315  AST 25  --   ALT 31  --   ALKPHOS 63  --   BILITOT 0.6  --   PROT 6.7  --   ALBUMIN 4.1 3.6   No results for input(s): "LIPASE", "AMYLASE" in the last 168 hours. No results for input(s): "AMMONIA" in the last 168 hours. Coagulation profile No results for input(s): "INR", "PROTIME" in the last 168 hours.  CBC: Recent Labs  Lab 09/17/23 2204 09/18/23 0314 09/18/23 1106 09/19/23 0709  WBC 4.1 5.1 4.5 5.5  NEUTROABS 3.0  --   --   --   HGB 11.0* 10.0* 9.8* 11.6*  HCT 32.6* 29.7* 29.1* 34.0*  MCV 95.3 94.3 92.7 93.4  PLT 52* 53* 50* 65*   Cardiac Enzymes: No results for  input(s): "CKTOTAL", "CKMB", "CKMBINDEX", "TROPONINI" in the last 168 hours. BNP: Invalid input(s): "POCBNP" CBG: No results for input(s): "GLUCAP" in the last 168 hours. D-Dimer No results for input(s): "DDIMER" in the last 72 hours. Hgb A1c No results for input(s): "HGBA1C" in the last 72 hours. Lipid Profile No results for input(s): "CHOL", "HDL", "LDLCALC", "TRIG", "CHOLHDL", "LDLDIRECT" in the last 72 hours. Thyroid function studies Recent Labs    09/18/23 0315  TSH 0.557   Anemia work up No results for input(s): "VITAMINB12", "FOLATE", "FERRITIN", "TIBC", "IRON", "RETICCTPCT" in the last 72 hours. Microbiology No results found for this or any previous visit (from the past 240 hour(s)).   Discharge Instructions:   Discharge Instructions     Discharge instructions   Complete by: As directed    Continue to use your incentive spirometer  HD on regular schedule   Increase activity slowly   Complete by: As directed       Allergies as of 09/19/2023       Reactions   Fentanyl Nausea Only   Vancomycin Other (See Comments)   Red face ? Red Man Syndrome ?        Medication List     TAKE these medications    acetaminophen 500 MG  tablet Commonly known as: TYLENOL Take 2 tablets (1,000 mg total) by mouth 4 (four) times daily. What changed:  how much to take when to take this reasons to take this   cinacalcet 30 MG tablet Commonly known as: SENSIPAR Take 30 mg by mouth every other day.   diphenhydramine-acetaminophen 25-500 MG Tabs tablet Commonly known as: TYLENOL PM Take 1 tablet by mouth at bedtime as needed (sleep).   fluticasone 50 MCG/ACT nasal spray Commonly known as: FLONASE Place 2 sprays into both nostrils daily as needed for allergies.   hydrocortisone cream 1 % Apply 1 application topically daily as needed for itching.   midodrine 10 MG tablet Commonly known as: PROAMATINE Take 30 mg by mouth once. Before dialysis.   multivitamin Tabs  tablet Take 1 tablet by mouth every morning.   oxyCODONE 5 MG immediate release tablet Commonly known as: Oxy IR/ROXICODONE Take 1 tablet (5 mg total) by mouth every 6 (six) hours as needed for moderate pain (pain score 4-6) or breakthrough pain.   polyethylene glycol 17 g packet Commonly known as: MIRALAX / GLYCOLAX Take 17 g by mouth daily as needed for mild constipation.   Renvela 800 MG tablet Generic drug: sevelamer carbonate Take 800-2,400 mg by mouth See admin instructions. Take 2400mg  with a meal and 800mg  with snacks.   tiZANidine 4 MG tablet Commonly known as: Zanaflex Take 1 tablet (4 mg total) by mouth 3 (three) times daily.        Follow-up Information     Medicine, [facility] Follow up in 1 week(s).   Specialty: Family Medicine Contact information: [address] [place] [address] [phone]                  Time coordinating discharge: 45 min  Signed:  Tryce Art DO  [facility] 09/19/2023, 12:00 PM

## 2023-09-19 NOTE — TOC CAGE-AID Note (Signed)
Transition of Care [facility]) - CAGE-AID Screening  Patient Details  Name: [name] [name] MRN: [id number] Date of Birth: 15-Jun-1961  Clinical Narrative:  Patient denies any current alcohol or drug use, has previous quit smoking cigarettes. Patient does not need substance abuse education at this time.  CAGE-AID Screening:   Have You Ever Felt You Ought to Cut Down on Your Drinking or Drug Use?: No Have People Annoyed You By Critizing Your Drinking Or Drug Use?: No Have You Felt Bad Or Guilty About Your Drinking Or Drug Use?: No Have You Ever Had a Drink or Used Drugs First Thing In The Morning to Steady Your Nerves or to Get Rid of a Hangover?: No CAGE-AID Score: 0  Substance Abuse Education Offered: No

## 2023-09-19 NOTE — Progress Notes (Signed)
Subjective/Chief Complaint: Pt with chest wall pain with coughing or movement    Objective: Vital signs in last 24 hours: Temp:  [97.4 F (36.3 C)-98.1 F (36.7 C)] 98.1 F (36.7 C) (12/01 0739) Pulse Rate:  [69-82] 70 (12/01 0739) Resp:  [16-22] 18 (12/01 0739) BP: (90-129)/(59-75) 97/61 (12/01 0739) SpO2:  [91 %-96 %] 94 % (12/01 0739) Weight:  [77.2 kg-79.3 kg] 77.2 kg (11/30 1837) Last BM Date : 09/18/23  Intake/Output from previous day: 11/30 0701 - 12/01 0700 In: -  Out: 2100  Intake/Output this shift: No intake/output data recorded.  Chest: right rib tenderness WOB normal no wheeze  Lab Results:  Recent Labs    09/18/23 1106 09/19/23 0709  WBC 4.5 5.5  HGB 9.8* 11.6*  HCT 29.1* 34.0*  PLT 50* 65*   BMET Recent Labs    09/17/23 2204 09/18/23 0315  NA 139 136  K 5.3* 5.5*  CL 98 99  CO2 26 21*  GLUCOSE 120* 109*  BUN 75* 78*  CREATININE 15.19* 15.54*  CALCIUM 10.1 9.4   PT/INR No results for input(s): "LABPROT", "INR" in the last 72 hours. ABG No results for input(s): "PHART", "HCO3" in the last 72 hours.  Invalid input(s): "PCO2", "PO2"  Studies/Results: CT CHEST ABDOMEN PELVIS WO CONTRAST  Result Date: 09/17/2023 CLINICAL DATA:  Poly trauma, blunt. Fell onto floor from bed. Pain in the posterior thorax and back. EXAM: CT CHEST, ABDOMEN AND PELVIS WITHOUT CONTRAST TECHNIQUE: Multidetector CT imaging of the chest, abdomen and pelvis was performed following the standard protocol without IV contrast. RADIATION DOSE REDUCTION: This exam was performed according to the departmental dose-optimization program which includes automated exposure control, adjustment of the mA and/or kV according to patient size and/or use of iterative reconstruction technique. COMPARISON:  Chest radiograph 10/27/2020. CT abdomen 08/21/2020. CT chest 09/14/2018. CT abdomen and pelvis 08/12/2018 FINDINGS: CT CHEST FINDINGS Cardiovascular: Normal heart size. No pericardial  effusions. Normal caliber thoracic aorta. Calcification of the aorta and coronary arteries. Mediastinum/Nodes: Esophagus is decompressed. No significant lymphadenopathy. Thyroid gland is unremarkable. Lungs/Pleura: Trace right pleural effusion. Moderate emphysematous changes throughout the lungs. Atelectasis or infiltration demonstrated in both lung bases. No pneumothorax. Musculoskeletal: Acute mildly displaced fractures of the right posterior seventh, eighth, ninth, tenth, and eleventh ribs. CT ABDOMEN PELVIS FINDINGS Hepatobiliary: Numerous hepatic cysts consistent with polycystic liver disease. No significant change. No imaging follow-up is indicated. Gallbladder is not visualized, possibly surgically absent or contracted. No bile duct dilatation. Pancreas: Unremarkable. No pancreatic ductal dilatation or surrounding inflammatory changes. Spleen: Spleen is mildly enlarged.  No focal lesions. Adrenals/Urinary Tract: No adrenal gland nodules. Kidneys have been resected in the interval since the previous study. Bladder is contracted. Stomach/Bowel: Stomach, small bowel, and colon are not abnormally distended. There appears to be partial right colonic resection with ileocolonic anastomosis. No inflammatory changes or obstruction. Vascular/Lymphatic: Aortic atherosclerosis. No enlarged abdominal or pelvic lymph nodes. Reproductive: Prostate is unremarkable. Other: No free air or free fluid in the abdomen. Abdominal wall musculature appears intact. Musculoskeletal: No fracture is seen. IMPRESSION: 1. Multiple right rib fractures with small right pleural effusion. Basilar atelectasis or consolidation in both lung bases. 2. No acute posttraumatic changes demonstrated in the abdomen or pelvis. 3. Polycystic liver disease. 4. Kidneys have been resected. 5. Aortic atherosclerosis. Electronically Signed   By: Burman Nieves M.D.   On: 09/17/2023 22:15    Anti-infectives: Anti-infectives (From admission, onward)     None  Assessment/Plan: Patient Active Problem List   Diagnosis Date Noted   Fall 09/17/2023   Pneumonia due to COVID-19 virus 10/26/2020   Transaminitis 10/26/2020   Hyperbilirubinemia 10/26/2020   Respiratory failure with hypoxia (HCC) 10/25/2020   History of DVT (deep vein thrombosis) 05/16/2019   CAP (community acquired pneumonia) 09/15/2018   Sepsis (HCC) 09/14/2018   End stage renal disease (HCC) 05/23/2014   Metabolic acidosis 04/04/2014   Essential hypertension, benign 04/04/2014   Acute encephalopathy 04/04/2014   Polycystic kidney disease 04/02/2014   Acute renal failure (HCC) 04/02/2014    Right rib fractures  Stable for D/C  No further follow up needed from trauma service  Recommend follow up with PCP as outpatient  Will sign off for now  Call [facility] any changes   LOS: 0 days    Dortha Schwalbe MD  09/19/2023 LOW COMPLEXITY

## 2023-09-20 LAB — HEPATITIS B SURFACE ANTIBODY, QUANTITATIVE: Hep B S AB Quant (Post): 64.8 m[IU]/mL

## 2023-09-20 NOTE — Discharge Planning (Signed)
[facility] Patient Discharge Orders- [facility] CLINIC: [place]  Patient's name: [name] [name] Admit/DC Dates: 09/17/2023 - 09/19/2023 Observation  Discharge Diagnoses: Fall w/R posterior rib fractures   New A fib  Aranesp: Given: no   Last Hgb: 11.6 PRBC's Given: no  ESA dose for discharge: none IV Iron dose at discharge: none  Heparin change: no  EDW Change: no   Bath Change: no  Access intervention/Change: no Details:  Hectorol/Calcitriol change: no  Discharge Labs: Calcium 9.4 Phosphorus 5.7 Albumin 3.6 K+ 5.5  IV Antibiotics: no Details:  On Coumadin?: no Last INR: Next INR: Managed By:   OTHER/APPTS/LAB ORDERS:    D/C Meds to be reconciled by nurse after every discharge.  Completed By: Virgina Norfolk, PA-C   Reviewed by: MD:______ RN_______

## 2024-11-15 ENCOUNTER — Ambulatory Visit

## 2024-11-16 NOTE — Progress Notes (Signed)
" ° ° °  Established Dialysis Access   History of Present Illness   [name] [name] is a 64 y.o. (August 12, 1961) male who presents for evaluation of aneurysm and thinned skin at the request of Dr. Dolan. The patient explains that the fistula is working great. He has concerns about the aneurysms getting larger and worried about bleeding. He denies any issues with function of the fistula or with bleeding.  He has had this left forearm AV fistula since 2015. He had a plication for aneurysms in 2020. He continues to dialyze on TTS.    Current Outpatient Medications  Medication Sig Dispense Refill   acetaminophen  (TYLENOL ) 500 MG tablet Take 2 tablets (1,000 mg total) by mouth 4 (four) times daily.     cinacalcet  (SENSIPAR ) 30 MG tablet Take 30 mg by mouth every other day.     diphenhydramine -acetaminophen  (TYLENOL  PM) 25-500 MG TABS tablet Take 1 tablet by mouth at bedtime as needed (sleep).     fluticasone  (FLONASE ) 50 MCG/ACT nasal spray Place 2 sprays into both nostrils daily as needed for allergies.     hydrocortisone  cream 1 % Apply 1 application topically daily as needed for itching.     midodrine  (PROAMATINE ) 10 MG tablet Take 30 mg by mouth once. Before dialysis.     multivitamin (RENA-VIT) TABS tablet Take 1 tablet by mouth every morning. 30 tablet PRN   oxyCODONE  (OXY IR/ROXICODONE ) 5 MG immediate release tablet Take 1 tablet (5 mg total) by mouth every 6 (six) hours as needed for moderate pain (pain score 4-6) or breakthrough pain. 30 tablet 0   polyethylene glycol (MIRALAX  / GLYCOLAX ) 17 g packet Take 17 g by mouth daily as needed for mild constipation.     RENVELA  800 MG tablet Take 800-2,400 mg by mouth See admin instructions. Take 2400mg  with a meal and 800mg  with snacks.     No current facility-administered medications for this visit.    On ROS today: negative unless stated in HPI   Physical Examination   Vitals:   11/17/24 0847  BP: 123/75  Pulse: 76  Weight: 183 lb 3.2 oz (83.1  kg)  Height: 5' 6 (1.676 m)   Body mass index is 29.57 kg/m.  General WDWN, in no distress  Pulmonary Non labored  Cardiac regular  Vascular Left forearm AV fistula with great thrill. 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 There is hypopigmentation along fistula however this skin is easily mobile overlying the aneurysmal area. There is no ulceration.    Musculo- skeletal M/S 5/5 throughout  , Extremities without ischemic changes    Neurologic A&O; CN grossly intact    Medical Decision Making   [name] [name] is a 64 y.o. male who presents for evaluation of aneurysmal area of fistula and thinning skin. The fistula is functioning well. 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 There is hypopigmentation however the skin is easily mobile overlying the aneurysmal area and there is no ulceration. Presently there is no concern for bleeding or degeneration of the fistula. He has had prior plications in the past. I discussed with him that if we were to plicate it again it would require extensive repair and could potentially impact the function of the fistula. At present I would not recommend any intervention. He will continue to monitor the fistula for any increase in size, bleeding issues, or progressive thinning. He can follow up with us  as needed.   Teretha Damme, PA-C [facility] Office: [phone]  Clinic MD: [place] "

## 2024-11-17 ENCOUNTER — Ambulatory Visit: Attending: Surgery | Admitting: Physician Assistant

## 2024-11-17 VITALS — BP 123/75 | HR 76 | Ht 66.0 in | Wt 183.2 lb

## 2024-11-17 DIAGNOSIS — N186 End stage renal disease: Secondary | ICD-10-CM | POA: Diagnosis not present
